# Patient Record
Sex: Male | Born: 1955 | Race: White | Hispanic: No | Marital: Single | Smoking: Never smoker
Health system: Southern US, Community
[De-identification: ages and names within clinical notes are randomized; demographics above are authoritative.]

## PROBLEM LIST (undated history)

## (undated) DIAGNOSIS — I499 Cardiac arrhythmia, unspecified: Secondary | ICD-10-CM

## (undated) DIAGNOSIS — H3322 Serous retinal detachment, left eye: Secondary | ICD-10-CM

## (undated) DIAGNOSIS — I480 Paroxysmal atrial fibrillation: Secondary | ICD-10-CM

## (undated) DIAGNOSIS — M1990 Unspecified osteoarthritis, unspecified site: Secondary | ICD-10-CM

## (undated) DIAGNOSIS — Z86718 Personal history of other venous thrombosis and embolism: Secondary | ICD-10-CM

## (undated) DIAGNOSIS — E119 Type 2 diabetes mellitus without complications: Secondary | ICD-10-CM

## (undated) DIAGNOSIS — N132 Hydronephrosis with renal and ureteral calculous obstruction: Secondary | ICD-10-CM

## (undated) DIAGNOSIS — Z87442 Personal history of urinary calculi: Secondary | ICD-10-CM

## (undated) DIAGNOSIS — N189 Chronic kidney disease, unspecified: Secondary | ICD-10-CM

## (undated) HISTORY — PX: EYE SURGERY: SHX253

## (undated) HISTORY — PX: CORONARY ANGIOPLASTY: SHX604

## (undated) HISTORY — PX: OTHER SURGICAL HISTORY: SHX169

## (undated) HISTORY — DX: Type 2 diabetes mellitus without complications: E11.9

## (undated) HISTORY — PX: REPAIR OF COMPLEX TRACTION RETINAL DETACHMENT: SHX6217

## (undated) HISTORY — PX: URETEROSCOPY WITH HOLMIUM LASER LITHOTRIPSY: SHX6645

## (undated) HISTORY — PX: FOOT SURGERY: SHX648

## (undated) HISTORY — PX: CATARACT EXTRACTION, BILATERAL: SHX1313

## (undated) HISTORY — DX: Serous retinal detachment, left eye: H33.22

## (undated) HISTORY — DX: Hydronephrosis with renal and ureteral calculous obstruction: N13.2

---

## 1995-06-15 HISTORY — PX: BACK SURGERY: SHX140

## 2015-08-29 DIAGNOSIS — R600 Localized edema: Secondary | ICD-10-CM | POA: Insufficient documentation

## 2015-08-29 DIAGNOSIS — G5702 Lesion of sciatic nerve, left lower limb: Secondary | ICD-10-CM | POA: Insufficient documentation

## 2015-08-29 DIAGNOSIS — H903 Sensorineural hearing loss, bilateral: Secondary | ICD-10-CM | POA: Insufficient documentation

## 2015-08-29 DIAGNOSIS — E6609 Other obesity due to excess calories: Secondary | ICD-10-CM | POA: Insufficient documentation

## 2015-08-29 DIAGNOSIS — H5060 Mechanical strabismus, unspecified: Secondary | ICD-10-CM | POA: Insufficient documentation

## 2015-08-29 DIAGNOSIS — E669 Obesity, unspecified: Secondary | ICD-10-CM | POA: Insufficient documentation

## 2015-08-29 DIAGNOSIS — H3322 Serous retinal detachment, left eye: Secondary | ICD-10-CM | POA: Insufficient documentation

## 2015-08-29 DIAGNOSIS — Z95828 Presence of other vascular implants and grafts: Secondary | ICD-10-CM | POA: Insufficient documentation

## 2015-09-04 DIAGNOSIS — E1140 Type 2 diabetes mellitus with diabetic neuropathy, unspecified: Secondary | ICD-10-CM | POA: Insufficient documentation

## 2015-09-04 DIAGNOSIS — I950 Idiopathic hypotension: Secondary | ICD-10-CM | POA: Insufficient documentation

## 2015-09-04 DIAGNOSIS — D3502 Benign neoplasm of left adrenal gland: Secondary | ICD-10-CM | POA: Insufficient documentation

## 2015-09-04 DIAGNOSIS — E119 Type 2 diabetes mellitus without complications: Secondary | ICD-10-CM | POA: Insufficient documentation

## 2015-09-04 DIAGNOSIS — K439 Ventral hernia without obstruction or gangrene: Secondary | ICD-10-CM | POA: Insufficient documentation

## 2015-09-04 DIAGNOSIS — K7689 Other specified diseases of liver: Secondary | ICD-10-CM | POA: Insufficient documentation

## 2015-09-28 DIAGNOSIS — R42 Dizziness and giddiness: Secondary | ICD-10-CM | POA: Insufficient documentation

## 2015-10-16 DIAGNOSIS — R222 Localized swelling, mass and lump, trunk: Secondary | ICD-10-CM | POA: Insufficient documentation

## 2015-10-17 DIAGNOSIS — R933 Abnormal findings on diagnostic imaging of other parts of digestive tract: Secondary | ICD-10-CM | POA: Insufficient documentation

## 2015-11-23 DIAGNOSIS — M7740 Metatarsalgia, unspecified foot: Secondary | ICD-10-CM | POA: Insufficient documentation

## 2015-12-31 DIAGNOSIS — N390 Urinary tract infection, site not specified: Secondary | ICD-10-CM | POA: Insufficient documentation

## 2015-12-31 DIAGNOSIS — N1330 Unspecified hydronephrosis: Secondary | ICD-10-CM | POA: Insufficient documentation

## 2016-01-14 DIAGNOSIS — N201 Calculus of ureter: Secondary | ICD-10-CM | POA: Insufficient documentation

## 2016-01-14 HISTORY — DX: Calculus of ureter: N20.1

## 2016-04-16 DIAGNOSIS — H43813 Vitreous degeneration, bilateral: Secondary | ICD-10-CM | POA: Insufficient documentation

## 2016-04-16 DIAGNOSIS — H5021 Vertical strabismus, right eye: Secondary | ICD-10-CM | POA: Insufficient documentation

## 2016-04-16 DIAGNOSIS — H53002 Unspecified amblyopia, left eye: Secondary | ICD-10-CM | POA: Insufficient documentation

## 2016-11-26 LAB — HM DIABETES EYE EXAM

## 2017-08-11 DIAGNOSIS — I480 Paroxysmal atrial fibrillation: Secondary | ICD-10-CM | POA: Diagnosis present

## 2017-09-20 DIAGNOSIS — Z87442 Personal history of urinary calculi: Secondary | ICD-10-CM | POA: Insufficient documentation

## 2020-02-05 ENCOUNTER — Other Ambulatory Visit: Payer: Self-pay

## 2020-02-05 ENCOUNTER — Encounter: Payer: Self-pay | Admitting: Podiatry

## 2020-02-05 ENCOUNTER — Ambulatory Visit (INDEPENDENT_AMBULATORY_CARE_PROVIDER_SITE_OTHER): Payer: Medicare Other | Admitting: Podiatry

## 2020-02-05 DIAGNOSIS — H43813 Vitreous degeneration, bilateral: Secondary | ICD-10-CM

## 2020-02-05 DIAGNOSIS — M79674 Pain in right toe(s): Secondary | ICD-10-CM | POA: Diagnosis not present

## 2020-02-05 DIAGNOSIS — G5702 Lesion of sciatic nerve, left lower limb: Secondary | ICD-10-CM

## 2020-02-05 DIAGNOSIS — E119 Type 2 diabetes mellitus without complications: Secondary | ICD-10-CM

## 2020-02-05 DIAGNOSIS — M79675 Pain in left toe(s): Secondary | ICD-10-CM

## 2020-02-05 DIAGNOSIS — Q828 Other specified congenital malformations of skin: Secondary | ICD-10-CM

## 2020-02-05 DIAGNOSIS — B351 Tinea unguium: Secondary | ICD-10-CM

## 2020-02-05 NOTE — Progress Notes (Signed)
This patient returns to my office for at risk foot care.  This patient requires this care by a professional since this patient will be at risk due to having diet controlled diabetes,    This patient is unable to cut nails himself since the patient cannot reach his nails.These nails are painful walking and wearing shoes. Patient has painful callus on both forefeet which are painful when he walks. This patient presents for at risk foot care today.  General Appearance  Alert, conversant and in no acute stress.  Vascular  Dorsalis pedis and posterior tibial  pulses are palpable  bilaterally.  Capillary return is within normal limits  bilaterally. Temperature is within normal limits  bilaterally.  Neurologic  Senn-Weinstein monofilament wire test within normal limits  Right.  LOPS absent left foot.. Muscle power within normal limits bilaterally.  Nails Thick disfigured discolored nails with subungual debris  from hallux to fifth toes bilaterally. No evidence of bacterial infection or drainage bilaterally.  Orthopedic  No limitations of motion  feet .  No crepitus or effusions noted.  HAV 1st MPJ  B/L.  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  Skin  normotropic skin  noted bilaterally.  No signs of infections or ulcers noted.   Porokeratosis  Forefeet  B/L.  Onychomycosis  Pain in right toes  Pain in left toes  Porokeratosis  B/L.  Consent was obtained for treatment procedures.   Mechanical debridement of nails 1-5  bilaterally performed with a nail nipper.  Filed with dremel without incident. Debridement of porokeratosis with # 15 blade.  Patient to talk with Liliane Channel about limb length discrepancy in future.   Return office visit    3 months                  Told patient to return for periodic foot care and evaluation due to potential at risk complications.  Gardiner Barefoot DPM   Gardiner Barefoot DPM

## 2020-02-05 NOTE — Patient Instructions (Signed)
po

## 2020-05-13 ENCOUNTER — Other Ambulatory Visit: Payer: Self-pay

## 2020-05-13 ENCOUNTER — Ambulatory Visit (INDEPENDENT_AMBULATORY_CARE_PROVIDER_SITE_OTHER): Payer: Medicare Other | Admitting: Podiatry

## 2020-05-13 ENCOUNTER — Encounter: Payer: Self-pay | Admitting: Podiatry

## 2020-05-13 DIAGNOSIS — Q828 Other specified congenital malformations of skin: Secondary | ICD-10-CM

## 2020-05-13 DIAGNOSIS — B351 Tinea unguium: Secondary | ICD-10-CM

## 2020-05-13 DIAGNOSIS — M79674 Pain in right toe(s): Secondary | ICD-10-CM

## 2020-05-13 DIAGNOSIS — E119 Type 2 diabetes mellitus without complications: Secondary | ICD-10-CM | POA: Diagnosis not present

## 2020-05-13 DIAGNOSIS — G5702 Lesion of sciatic nerve, left lower limb: Secondary | ICD-10-CM

## 2020-05-13 DIAGNOSIS — M79675 Pain in left toe(s): Secondary | ICD-10-CM | POA: Diagnosis not present

## 2020-05-13 NOTE — Progress Notes (Addendum)
This patient returns to my office for at risk foot care.  This patient requires this care by a professional since this patient will be at risk due to having diet controlled diabetes,    This patient is unable to cut nails himself since the patient cannot reach his nails.These nails are painful walking and wearing shoes. Patient has painful callus on both forefeet which are painful when he walks. This patient presents for at risk foot care today.  General Appearance  Alert, conversant and in no acute stress.  Vascular  Dorsalis pedis and posterior tibial  pulses are palpable  bilaterally.  Capillary return is within normal limits  bilaterally. Temperature is within normal limits  bilaterally.  Neurologic  Senn-Weinstein monofilament wire test within normal limits  Right.  LOPS absent left foot.. Muscle power within normal limits bilaterally.  Nails Thick disfigured discolored nails with subungual debris  from hallux to fifth toes bilaterally. No evidence of bacterial infection or drainage bilaterally.  Orthopedic  No limitations of motion  feet .  No crepitus or effusions noted.  HAV 1st MPJ  B/L.  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  Skin  normotropic skin  noted bilaterally.  No signs of infections or ulcers noted.   Porokeratosis  Forefeet  B/L. Callus 1,5  Callus.  Callus 1.3 right foot.  Onychomycosis  Pain in right toes  Pain in left toes  Porokeratosis  B/L.  Consent was obtained for treatment procedures.   Mechanical debridement of nails 1-5  bilaterally performed with a nail nipper.  Filed with dremel without incident. Debridement of porokeratosis with # 15 blade.     Return office visit    4 months                  Told patient to return for periodic foot care and evaluation due to potential at risk complications.     Gardiner Barefoot DPM

## 2020-09-10 ENCOUNTER — Ambulatory Visit: Payer: Medicare Other | Admitting: Podiatry

## 2021-11-18 ENCOUNTER — Emergency Department (HOSPITAL_COMMUNITY): Payer: Medicare Other

## 2021-11-18 ENCOUNTER — Ambulatory Visit: Payer: Self-pay

## 2021-11-18 ENCOUNTER — Encounter (HOSPITAL_COMMUNITY): Payer: Self-pay | Admitting: Internal Medicine

## 2021-11-18 ENCOUNTER — Other Ambulatory Visit: Payer: Self-pay

## 2021-11-18 ENCOUNTER — Inpatient Hospital Stay (HOSPITAL_COMMUNITY)
Admission: EM | Admit: 2021-11-18 | Discharge: 2021-11-26 | DRG: 271 | Disposition: A | Discharge status: Home Health | Payer: Medicare Other | Attending: Internal Medicine | Admitting: Internal Medicine

## 2021-11-18 DIAGNOSIS — Z91041 Radiographic dye allergy status: Secondary | ICD-10-CM | POA: Diagnosis not present

## 2021-11-18 DIAGNOSIS — Z86718 Personal history of other venous thrombosis and embolism: Secondary | ICD-10-CM | POA: Diagnosis not present

## 2021-11-18 DIAGNOSIS — Y92009 Unspecified place in unspecified non-institutional (private) residence as the place of occurrence of the external cause: Secondary | ICD-10-CM

## 2021-11-18 DIAGNOSIS — I82411 Acute embolism and thrombosis of right femoral vein: Secondary | ICD-10-CM | POA: Diagnosis present

## 2021-11-18 DIAGNOSIS — Z888 Allergy status to other drugs, medicaments and biological substances status: Secondary | ICD-10-CM | POA: Diagnosis not present

## 2021-11-18 DIAGNOSIS — K439 Ventral hernia without obstruction or gangrene: Secondary | ICD-10-CM | POA: Diagnosis present

## 2021-11-18 DIAGNOSIS — I82220 Acute embolism and thrombosis of inferior vena cava: Secondary | ICD-10-CM | POA: Diagnosis present

## 2021-11-18 DIAGNOSIS — M7989 Other specified soft tissue disorders: Secondary | ICD-10-CM

## 2021-11-18 DIAGNOSIS — Z95828 Presence of other vascular implants and grafts: Secondary | ICD-10-CM | POA: Diagnosis not present

## 2021-11-18 DIAGNOSIS — R2241 Localized swelling, mass and lump, right lower limb: Secondary | ICD-10-CM | POA: Diagnosis not present

## 2021-11-18 DIAGNOSIS — Z6836 Body mass index (BMI) 36.0-36.9, adult: Secondary | ICD-10-CM | POA: Diagnosis not present

## 2021-11-18 DIAGNOSIS — I824Y3 Acute embolism and thrombosis of unspecified deep veins of proximal lower extremity, bilateral: Secondary | ICD-10-CM | POA: Diagnosis present

## 2021-11-18 DIAGNOSIS — Z7401 Bed confinement status: Secondary | ICD-10-CM

## 2021-11-18 DIAGNOSIS — E1165 Type 2 diabetes mellitus with hyperglycemia: Secondary | ICD-10-CM | POA: Diagnosis present

## 2021-11-18 DIAGNOSIS — I82409 Acute embolism and thrombosis of unspecified deep veins of unspecified lower extremity: Secondary | ICD-10-CM

## 2021-11-18 DIAGNOSIS — E1169 Type 2 diabetes mellitus with other specified complication: Secondary | ICD-10-CM

## 2021-11-18 DIAGNOSIS — Z87442 Personal history of urinary calculi: Secondary | ICD-10-CM | POA: Diagnosis not present

## 2021-11-18 DIAGNOSIS — M79604 Pain in right leg: Secondary | ICD-10-CM

## 2021-11-18 DIAGNOSIS — E119 Type 2 diabetes mellitus without complications: Secondary | ICD-10-CM

## 2021-11-18 DIAGNOSIS — D3502 Benign neoplasm of left adrenal gland: Secondary | ICD-10-CM | POA: Diagnosis present

## 2021-11-18 DIAGNOSIS — E669 Obesity, unspecified: Secondary | ICD-10-CM | POA: Diagnosis present

## 2021-11-18 DIAGNOSIS — W1830XA Fall on same level, unspecified, initial encounter: Secondary | ICD-10-CM | POA: Diagnosis present

## 2021-11-18 DIAGNOSIS — I745 Embolism and thrombosis of iliac artery: Secondary | ICD-10-CM | POA: Diagnosis present

## 2021-11-18 DIAGNOSIS — R739 Hyperglycemia, unspecified: Secondary | ICD-10-CM | POA: Diagnosis present

## 2021-11-18 DIAGNOSIS — I82431 Acute embolism and thrombosis of right popliteal vein: Secondary | ICD-10-CM | POA: Diagnosis not present

## 2021-11-18 DIAGNOSIS — N179 Acute kidney failure, unspecified: Secondary | ICD-10-CM | POA: Diagnosis not present

## 2021-11-18 DIAGNOSIS — I82423 Acute embolism and thrombosis of iliac vein, bilateral: Secondary | ICD-10-CM | POA: Diagnosis present

## 2021-11-18 DIAGNOSIS — E1140 Type 2 diabetes mellitus with diabetic neuropathy, unspecified: Secondary | ICD-10-CM

## 2021-11-18 DIAGNOSIS — N132 Hydronephrosis with renal and ureteral calculous obstruction: Secondary | ICD-10-CM | POA: Diagnosis present

## 2021-11-18 DIAGNOSIS — I480 Paroxysmal atrial fibrillation: Secondary | ICD-10-CM | POA: Diagnosis present

## 2021-11-18 DIAGNOSIS — I82401 Acute embolism and thrombosis of unspecified deep veins of right lower extremity: Secondary | ICD-10-CM | POA: Diagnosis not present

## 2021-11-18 DIAGNOSIS — I82413 Acute embolism and thrombosis of femoral vein, bilateral: Secondary | ICD-10-CM | POA: Diagnosis not present

## 2021-11-18 HISTORY — DX: Paroxysmal atrial fibrillation: I48.0

## 2021-11-18 HISTORY — DX: Personal history of other venous thrombosis and embolism: Z86.718

## 2021-11-18 LAB — CBC WITH DIFFERENTIAL/PLATELET
Abs Immature Granulocytes: 0.06 10*3/uL (ref 0.00–0.07)
Basophils Absolute: 0 10*3/uL (ref 0.0–0.1)
Basophils Relative: 0 %
Eosinophils Absolute: 0.1 10*3/uL (ref 0.0–0.5)
Eosinophils Relative: 1 %
HCT: 42.7 % (ref 39.0–52.0)
Hemoglobin: 14.6 g/dL (ref 13.0–17.0)
Immature Granulocytes: 1 %
Lymphocytes Relative: 24 %
Lymphs Abs: 2 10*3/uL (ref 0.7–4.0)
MCH: 30.5 pg (ref 26.0–34.0)
MCHC: 34.2 g/dL (ref 30.0–36.0)
MCV: 89.3 fL (ref 80.0–100.0)
Monocytes Absolute: 0.5 10*3/uL (ref 0.1–1.0)
Monocytes Relative: 6 %
Neutro Abs: 5.4 10*3/uL (ref 1.7–7.7)
Neutrophils Relative %: 68 %
Platelets: 205 10*3/uL (ref 150–400)
RBC: 4.78 MIL/uL (ref 4.22–5.81)
RDW: 13.3 % (ref 11.5–15.5)
WBC: 8 10*3/uL (ref 4.0–10.5)
nRBC: 0 % (ref 0.0–0.2)

## 2021-11-18 LAB — BASIC METABOLIC PANEL
Anion gap: 7 (ref 5–15)
BUN: 19 mg/dL (ref 8–23)
CO2: 25 mmol/L (ref 22–32)
Calcium: 9.1 mg/dL (ref 8.9–10.3)
Chloride: 105 mmol/L (ref 98–111)
Creatinine, Ser: 1.16 mg/dL (ref 0.61–1.24)
GFR, Estimated: >60 mL/min (ref >60)
Glucose, Bld: 172 mg/dL — ABNORMAL HIGH (ref 70–99)
Potassium: 4.4 mmol/L (ref 3.5–5.1)
Sodium: 137 mmol/L (ref 135–145)

## 2021-11-18 MED ORDER — ONDANSETRON HCL 4 MG/2ML IJ SOLN: 4.0000 mg | Freq: Four times a day (QID) | INTRAMUSCULAR | Status: DC | PRN

## 2021-11-18 MED ORDER — ACETAMINOPHEN 325 MG PO TABS: 650.0000 mg | ORAL_TABLET | Freq: Four times a day (QID) | ORAL | Status: DC | PRN

## 2021-11-18 MED ORDER — SENNOSIDES-DOCUSATE SODIUM 8.6-50 MG PO TABS: 1.0000 | ORAL_TABLET | Freq: Every evening | ORAL | Status: DC | PRN

## 2021-11-18 MED ORDER — ACETAMINOPHEN 650 MG RE SUPP: 650.0000 mg | Freq: Four times a day (QID) | RECTAL | Status: DC | PRN

## 2021-11-18 MED ORDER — METHYLPREDNISOLONE SODIUM SUCC 40 MG IJ SOLR
40.0000 mg | Freq: Once | INTRAMUSCULAR | Status: AC
  Administered 2021-11-18: 40 mg via INTRAVENOUS
  Filled 2021-11-18: qty 1

## 2021-11-18 MED ORDER — HEPARIN (PORCINE) 25000 UT/250ML-% IV SOLN
1800.0000 [IU]/h | INTRAVENOUS | Status: AC
Start: 2021-11-18 — End: 2021-11-25
  Administered 2021-11-18: 1700 [IU]/h via INTRAVENOUS
  Administered 2021-11-19 – 2021-11-21 (×3): 1500 [IU]/h via INTRAVENOUS
  Administered 2021-11-23: 1650 [IU]/h via INTRAVENOUS
  Administered 2021-11-24 (×2): 1850 [IU]/h via INTRAVENOUS
  Administered 2021-11-25: 1800 [IU]/h via INTRAVENOUS
  Filled 2021-11-18 (×11): qty 250

## 2021-11-18 MED ORDER — DIPHENHYDRAMINE HCL 25 MG PO CAPS
50.0000 mg | ORAL_CAPSULE | Freq: Once | ORAL | Status: AC
  Administered 2021-11-18: 50 mg via ORAL
  Filled 2021-11-18: qty 2

## 2021-11-18 MED ORDER — SODIUM CHLORIDE 0.9% FLUSH
3.0000 mL | Freq: Two times a day (BID) | INTRAVENOUS | Status: DC
  Administered 2021-11-19 – 2021-11-26 (×7): 3 mL via INTRAVENOUS

## 2021-11-18 MED ORDER — ONDANSETRON HCL 4 MG PO TABS: 4.0000 mg | ORAL_TABLET | Freq: Four times a day (QID) | ORAL | Status: DC | PRN

## 2021-11-18 MED ORDER — DIPHENHYDRAMINE HCL 50 MG/ML IJ SOLN: 50.0000 mg | Freq: Once | INTRAMUSCULAR | Status: DC

## 2021-11-18 MED ORDER — HEPARIN BOLUS VIA INFUSION
6500.0000 [IU] | Freq: Once | INTRAVENOUS | Status: AC
Start: 2021-11-18 — End: 2021-11-18
  Administered 2021-11-18: 6500 [IU] via INTRAVENOUS
  Filled 2021-11-18: qty 6500

## 2021-11-18 NOTE — Consult Note (Signed)
[facility]  Patient name: Nicholas Lam MRN: [id number] DOB: 1955-09-08 Sex: male   REQUESTING PROVIDER:    ER   REASON FOR CONSULT:    DVT  HISTORY OF PRESENT ILLNESS:   Nicholas Lam is a 66 y.o. male, who presented to the emergency department yesterday with complaints of right leg pain and swelling that has been going on for a few days.  He does report a fall a week or 2 ago and since that time he has been relatively sedentary.  He does have a history of a IVC filter placed in 2009.  According to the patient, he never had a DVT, but feels like he is at high risk.  The filter was placed for preventative measures.  It was initially placed to be taken out however the patient decided that he wanted to leave it in.  The patient has a history of A-fib.  He spontaneously converted, and so his Xarelto was discontinued by cardiology.  Patient does have a history of a ventral hernia.  He is a non-smoker.  PAST MEDICAL HISTORY    Past Medical History:  Diagnosis Date   History of DVT (deep vein thrombosis)    Paroxysmal atrial fibrillation (HCC)      FAMILY HISTORY   No family history on file.  SOCIAL HISTORY:   Social History   Socioeconomic History   Marital status: Single    Spouse name: Not on file   Number of children: Not on file   Years of education: Not on file   Highest education level: Not on file  Occupational History   Not on file  Tobacco Use   Smoking status: Never   Smokeless tobacco: Never  Substance and Sexual Activity   Alcohol use: Never   Drug use: Never   Sexual activity: Not on file  Other Topics Concern   Not on file  Social History Narrative   Not on file   Social Determinants of Health   Financial Resource Strain: Not on file  Food Insecurity: Not on file  Transportation Needs: Not on file  Physical Activity: Not on file  Stress: Not on file  Social Connections: Not on  file  Intimate Partner Violence: Not on file    ALLERGIES:    Allergies  Allergen Reactions   Iodinated Contrast Media Hives, Rash and Swelling   Iodine     CURRENT MEDICATIONS:    Current Facility-Administered Medications  Medication Dose Route Frequency Provider Last Rate Last Admin   diphenhydrAMINE (BENADRYL) capsule 50 mg  50 mg Oral Once Genevive Bi F, PA-C       Or   diphenhydrAMINE (BENADRYL) injection 50 mg  50 mg Intravenous Once Genevive Bi F, PA-C       heparin ADULT infusion 100 units/mL (25000 units/238m)  1,700 Units/hr Intravenous Continuous Paytes, Austin A, RPH 17 mL/hr at 11/18/21 2021 1,700 Units/hr at 11/18/21 2021   heparin bolus via infusion 6,500 Units  6,500 Units Intravenous Once Paytes, Austin A, RPH       methylPREDNISolone sodium succinate (SOLU-MEDROL) 40 mg/mL injection 40 mg  40 mg Intravenous Once GGenevive BiF, PA-C       No current outpatient medications on file.    REVIEW OF SYSTEMS:   '[X]'$  denotes positive finding, '[ ]'$  denotes negative finding Cardiac  Comments:  Chest pain or chest pressure:    Shortness of breath upon exertion:    Short of breath when lying  flat:    Irregular heart rhythm:        Vascular    Pain in calf, thigh, or hip brought on by ambulation:    Pain in feet at night that wakes you up from your sleep:     Blood clot in your veins:    Leg swelling:  x       Pulmonary    Oxygen at home:    Productive cough:     Wheezing:         Neurologic    Sudden weakness in arms or legs:     Sudden numbness in arms or legs:     Sudden onset of difficulty speaking or slurred speech:    Temporary loss of vision in one eye:     Problems with dizziness:         Gastrointestinal    Blood in stool:      Vomited blood:         Genitourinary    Burning when urinating:     Blood in urine:        Psychiatric    Major depression:         Hematologic    Bleeding problems:    Problems with blood  clotting too easily:        Skin    Rashes or ulcers:        Constitutional    Fever or chills:     PHYSICAL EXAM:   Vitals:   11/18/21 2015 11/18/21 2030 11/18/21 2045 11/18/21 2052  BP:  (!) 145/70 (!) 116/98 (!) 116/98  Pulse: 63 77 73 72  Resp:    18  Temp:      TempSrc:      SpO2: 99% 98% 95% 99%    GENERAL: The patient is a well-nourished male, in no acute distress. The vital signs are documented above. CARDIAC: There is a regular rate and rhythm.  VASCULAR: Significant right leg edema.  No evidence of ischemia PULMONARY: Nonlabored respirations ABDOMEN: Soft and non-tender with normal pitched bowel sounds.  MUSCULOSKELETAL: There are no major deformities or cyanosis. NEUROLOGIC: No focal weakness or paresthesias are detected. SKIN: There are no ulcers or rashes noted. PSYCHIATRIC: The patient has a normal affect.  STUDIES:   I have reviewed the following Studies: IVC/Iliac: There is evidence of acute thrombus involving the right common  iliac vein. There is evidence of acute thrombus involving the left common  iliac vein. There is evidence of acute thrombus involving the right  external iliac vein. There is evidence   of nonacute thrombus involving the left external iliac vein. IVC not  visualized due to overlying abdominal hernia.   RIGHT:  - Findings consistent with acute deep vein thrombosis involving the right  common femoral vein, SF junction, right femoral vein, right proximal  profunda vein, right popliteal vein, right posterior tibial veins, right  peroneal veins, and right  gastrocnemius veins.  - Findings consistent with acute superficial vein thrombosis involving the  right small saphenous vein, and right great saphenous vein.  - No cystic structure found in the popliteal fossa.     LEFT:  - No evidence of common femoral vein obstruction.     CTVenogram: 1. Extensive deep venous thrombosis involving the IVC, common, internal, and external iliac  veins bilaterally and common femoral vein on the right. The IVC thrombus extends to the level of a IVC filter. No thrombus is seen distal to the IVC filter.  2. 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 3. Left adrenal adenoma. 4. Subcutaneous fat stranding over the lateral aspect of the right hip, possible edema, contusion, or infection. 5. Mild compression deformities at L1 and L3, indeterminate in age. 6. Aortic atherosclerosis. 7. Remaining chronic findings as described above.   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  Because of the extensive nature of his clot as well as the symptoms, I feel he would benefit from intervention.  I am trying to find a spot on the schedule this week however this may need to be done early next week.   Leia Alf, MD, FACS [facility] [phone] Pager [phone]

## 2021-11-18 NOTE — Progress Notes (Signed)
RLE venous duplex and IVC/iliac duplex has been completed.  Preliminary results given to [facility], PA-C.   Results can be found under chart review under CV PROC. 11/18/2021 4:51 PM Kiyoko Mcguirt RVT, RDMS

## 2021-11-18 NOTE — ED Provider Triage Note (Addendum)
Emergency Medicine Provider Triage Evaluation Note  Kendarious Gudino , a 66 y.o. male  was evaluated in triage.  Pt complains of right knee pain and swelling.  Started 4 days ago, progressively worsening.  History of IVC filter, no chest pain or shortness of breath, not on blood thinners.  States it feels like previous DVT,.  Review of Systems  Per HPI  Physical Exam  BP (!) 145/97   Pulse 87   Temp 98.6 F (37 C) (Oral)   Resp 16   SpO2 97%  Gen:   Awake, no distress   Resp:  Normal effort  MSK:   Moves extremities without difficulty  Other:  Right knee is warm and swollen.  Pitting edema to the right lower extremity.  Calf tenderness. Cap refill 2-3 seconds  Medical Decision Making  Medically screening exam initiated at 2:48 PM.  Appropriate orders placed.  Vanessa Alesi was informed that the remainder of the evaluation will be completed by another provider, this initial triage assessment does not replace that evaluation, and the importance of remaining in the ED until their evaluation is complete.  Per hpi   Sherrill Raring, PA-C 11/18/21 [place], Kwinton Maahs, PA-C 11/18/21 1616

## 2021-11-18 NOTE — ED Provider Notes (Signed)
[facility] DEPART[facility] Provider Note   CSN: [id number] Arrival date & time: 11/18/21  1415     History  Chief Complaint  Patient presents with   Leg Swelling    Nicholas Lam is a 66 y.o. 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  Patient presents to the ED for evaluation of right leg pain.  Patient states that 3 days ago he developed right lower extremity calf pain described as a pressure.  The patient states that since this time this pressure has progressively worsened.  Patient does not currently have a PCP.  Patient denies any numbness or tingling into the right lower extremity.  Patient denies any extreme pain in his right lower extremity.  Patient denies any recent fevers, chest pain, shortness of breath.  HPI     Home Medications Prior to Admission medications   Not on File      Allergies    Iodinated contrast media and Iodine    Review of Systems   Review of Systems  Respiratory:  Negative for shortness of breath.   Cardiovascular:  Negative for chest pain.  Musculoskeletal:  Positive for myalgias.  Neurological:  Negative for numbness.  All other systems reviewed and are negative.  Physical Exam Updated Vital Signs BP (!) 116/98 (BP Location: Right Arm)   Pulse 72   Temp 98.3 F (36.8 C) (Oral)   Resp 18   SpO2 99%  Physical Exam Vitals and nursing note reviewed.  Constitutional:      General: He is not in acute distress.    Appearance: Normal appearance. He is not ill-appearing, toxic-appearing or diaphoretic.  HENT:     Head: Normocephalic and atraumatic.     Nose: Nose normal. No congestion.     Mouth/Throat:     Mouth: Mucous membranes are moist.     Pharynx: Oropharynx is clear.  Eyes:     Extraocular Movements: Extraocular movements intact.     Conjunctiva/sclera: Conjunctivae normal.     Pupils: Pupils are equal, round, and reactive to light.  Cardiovascular:     Rate and  Rhythm: Normal rate and regular rhythm.     Pulses:          Dorsalis pedis pulses are detected w/ Doppler on the right side and detected w/ Doppler on the left side.  Pulmonary:     Effort: Pulmonary effort is normal.     Breath sounds: Normal breath sounds. No wheezing.  Abdominal:     General: Abdomen is flat. Bowel sounds are normal.     Palpations: Abdomen is soft.     Tenderness: There is no abdominal tenderness.  Musculoskeletal:        General: Normal range of motion.     Cervical back: Normal range of motion and neck supple. No tenderness.     Comments: All 4 compartments of patient right lower extremity soft compressible.  Patient denies any pain out of proportion to exam.  Skin:    General: Skin is warm and dry.     Capillary Refill: Capillary refill takes less than 2 seconds.  Neurological:     General: No focal deficit present.     Mental Status: He is alert and oriented to person, place, and time.     GCS: GCS eye subscore is 4. GCS verbal subscore is 5. GCS motor subscore is 6.     Cranial Nerves: Cranial nerves 2-12 are intact. No  cranial nerve deficit.     Sensory: Sensation is intact. No sensory deficit.     Motor: Motor function is intact. No weakness.     Comments: Patient has intact sensation and motor function to right lower extremity.    ED Results / Procedures / Treatments   Labs (all labs ordered are listed, but only abnormal results are displayed) Labs Reviewed  BASIC METABOLIC PANEL - Abnormal; Notable for the following components:      Result Value   Glucose, Bld 172 (*)    All other components within normal limits  CBC WITH DIFFERENTIAL/PLATELET  HEPARIN LEVEL (UNFRACTIONATED)  HEPARIN LEVEL (UNFRACTIONATED)    EKG None  Radiology VAS [place] IVC/ILIAC (VENOUS ONLY)  Result Date: 11/18/2021 IVC/ILIAC STUDY Patient Name:  Vaden Becherer  Date of Exam:   11/18/2021 Medical Rec #: [id number]      Accession #:    [id number] Date of Birth: Jul 24, 1955      Patient Gender: M Patient Age:   24 years Exam Location:  [facility] Procedure:      VAS [place] IVC/ILIAC (VENOUS ONLY) Referring Phys: --------------------------------------------------------------------------------  Indications: RLE DVT Limitations: Obesity and Abdominal hernia.  Comparison Study: No previous exams Performing Technologist: Jody Hill RVT, RDMS  Examination Guidelines: A complete evaluation includes B-mode imaging, spectral Doppler, color Doppler, and power Doppler as needed of all accessible portions of each vessel. Bilateral testing is considered an integral part of a complete examination. Limited examinations for reoccurring indications may be performed as noted.  IVC/Iliac Findings: +----------+------+--------+--------------+    IVC    PatentThrombus   Comments    +----------+------+--------+--------------+ IVC Prox                not visualized +----------+------+--------+--------------+ IVC Mid                 not visualized +----------+------+--------+--------------+ IVC Distal              not visualized +----------+------+--------+--------------+  +-------------------+---------+-----------+---------+-----------+--------+         CIV        RT-PatentRT-ThrombusLT-PatentLT-ThrombusComments +-------------------+---------+-----------+---------+-----------+--------+ Common Iliac Prox              acute               acute            +-------------------+---------+-----------+---------+-----------+--------+ Common Iliac Mid               acute               acute            +-------------------+---------+-----------+---------+-----------+--------+ Common Iliac Distal                                acute            +-------------------+---------+-----------+---------+-----------+--------+ Right distal CIV not visualized +-------------------------+---------+-----------+---------+-----------+--------+            EIV            RT-PatentRT-ThrombusLT-PatentLT-ThrombusComments +-------------------------+---------+-----------+---------+-----------+--------+ External Iliac Vein Prox             acute               acute            +-------------------------+---------+-----------+---------+-----------+--------+ External Iliac Vein Mid              acute  acute            +-------------------------+---------+-----------+---------+-----------+--------+ External Iliac Vein                  acute               acute            Distal                                                                    +-------------------------+---------+-----------+---------+-----------+--------+  Non occlusive thrombus seen in left EIV  Summary: IVC/Iliac: There is evidence of acute thrombus involving the right common iliac vein. There is evidence of acute thrombus involving the left common iliac vein. There is evidence of acute thrombus involving the right external iliac vein. There is evidence  of nonacute thrombus involving the left external iliac vein. IVC not visualized due to overlying abdominal hernia.  *See table(s) above for measurements and observations.   Preliminary    VAS [place] LOWER EXTREMITY VENOUS (DVT) (7a-7p)  Result Date: 11/18/2021  Lower Venous DVT Study Patient Name:  ARK AGRUSA  Date of Exam:   11/18/2021 Medical Rec #: [id number]      Accession #:    [id number] Date of Birth: [birth year]     Patient Gender: M Patient Age:   19 years Exam Location:  [facility] Procedure:      VAS [place] LOWER EXTREMITY VENOUS (DVT) Referring Phys: HALEY SAGE --------------------------------------------------------------------------------  Indications: Swelling, and Pain.  Risk Factors: Patient had IVC filter placed in 2009. States he was diagnosed to DVT without imaging study - only through elevated D-dimer. Limitations: Body habitus and poor ultrasound/tissue interface. Comparison Study: No previous exams  Performing Technologist: Jody Hill RVT, RDMS  Examination Guidelines: A complete evaluation includes B-mode imaging, spectral Doppler, color Doppler, and power Doppler as needed of all accessible portions of each vessel. Bilateral testing is considered an integral part of a complete examination. Limited examinations for reoccurring indications may be performed as noted. The reflux portion of the exam is performed with the patient in reverse Trendelenburg.  +---------+---------------+---------+-----------+----------+--------------+ RIGHT    CompressibilityPhasicitySpontaneityPropertiesThrombus Aging +---------+---------------+---------+-----------+----------+--------------+ CFV      None           No       No                   Acute          +---------+---------------+---------+-----------+----------+--------------+ SFJ      None                                         Acute          +---------+---------------+---------+-----------+----------+--------------+ FV Prox  None           No       No                   Acute          +---------+---------------+---------+-----------+----------+--------------+ FV Mid   None           No       No  Acute          +---------+---------------+---------+-----------+----------+--------------+ FV DistalNone           No       No                   Acute          +---------+---------------+---------+-----------+----------+--------------+ PFV      None           No       No                   Acute          +---------+---------------+---------+-----------+----------+--------------+ POP      None           No       No                   Acute          +---------+---------------+---------+-----------+----------+--------------+ Gastroc  None           No       No                   Acute          +---------+---------------+---------+-----------+----------+--------------+ GSV      None           No       No                    Acute          +---------+---------------+---------+-----------+----------+--------------+ SSV      None           No       No                   Acute          +---------+---------------+---------+-----------+----------+--------------+   Right Technical Findings: Only able to see proximal portion of SSV due to immobility  +----+---------------+---------+-----------+----------+--------------+ LEFTCompressibilityPhasicitySpontaneityPropertiesThrombus Aging +----+---------------+---------+-----------+----------+--------------+ CFV Full           Yes      Yes                                 +----+---------------+---------+-----------+----------+--------------+ Rouleaux flow seen in CFV    Summary: RIGHT: - Findings consistent with acute deep vein thrombosis involving the right common femoral vein, SF junction, right femoral vein, right proximal profunda vein, right popliteal vein, right posterior tibial veins, right peroneal veins, and right gastrocnemius veins. - Findings consistent with acute superficial vein thrombosis involving the right small saphenous vein, and right great saphenous vein. - No cystic structure found in the popliteal fossa.  LEFT: - No evidence of common femoral vein obstruction.  *See table(s) above for measurements and observations.    Preliminary     Procedures Procedures    Medications Ordered in ED Medications  heparin bolus via infusion 6,500 Units (has no administration in time range)  heparin ADULT infusion 100 units/mL (25000 units/220m) (1,700 Units/hr Intravenous New Bag/Given 11/18/21 2021)  methylPREDNISolone sodium succinate (SOLU-MEDROL) 40 mg/mL injection 40 mg (has no administration in time range)  diphenhydrAMINE (BENADRYL) capsule 50 mg (has no administration in time range)    Or  diphenhydrAMINE (BENADRYL) injection 50 mg (has no administration in time range)    ED Course/ Medical Decision Making/ A&P  Medical Decision Making Amount and/or Complexity of Data Reviewed Labs: ordered. Radiology: ordered.  Risk Prescription drug management. Decision regarding hospitalization.   66 year old male presents to the ED for evaluation.  Please see HPI for further details.  On examination, the patient is afebrile and nontachycardic.  The patient lung sounds are clear bilaterally and he is not hypoxic on room air.  Patient's abdomen soft and compressible all 4 quadrants.  Patient right lower extremity does not have any overlying erythema, signs of compartment syndrome.  The patient is neurovascularly intact and has intact motor function to his right lower extremity.  Patient DP pulses on right appreciated with Doppler ultrasound.  Patient worked up utilizing the following labs and imaging studies interpreted by me personally: - BMP shows elevated glucose to 172 - CBC unremarkable - Ultrasound of right lower extremity shows findings consistent with acute deep vein thrombosis involving the right common femoral vein, SF junction, right femoral vein, right proximal profunda vein, right popliteal vein, right posterior tibial veins, right peroneal veins, and right. - Ultrasound of iliac vein shows evidence of acute thrombus involving the right common iliac vein as well as thrombus involving left common iliac vein.  There is also evidence of thrombus involving the right external iliac vein.  Patient started on heparin drip performed see consult.  Dr. Trula Slade vascular surgery was consulted.  Dr. Trula Slade has agreed to consult on the patient.  CT venogram abdomen pelvis has also been ordered per Dr. Stephens Shire request. Patient has contrast dye allergy so he has been pretreated for this.  Dr. Posey Pronto of [facility] hospitalist was consulted for admission.  Dr. Posey Pronto has agreed to admit the patient for further management.  Patient is amenable to the plan and the patient is stable at time of admission.  Final Clinical  Impression(s) / ED Diagnoses Final diagnoses:  Acute deep vein thrombosis (DVT) of proximal vein of both lower extremities [facility])    Rx / DC Orders ED Discharge Orders     None         Lawana Chambers 11/18/21 2119    Teressa Lower, MD 11/18/21 [phone]

## 2021-11-18 NOTE — Assessment & Plan Note (Signed)
History of atrial fibrillation during admission December 2018 during acute influenza illness.  Unaware of any recurrence as he is not having regular medical follow-up. -Obtain EKG, follow on telemetry -On IV heparin as above

## 2021-11-18 NOTE — Telephone Encounter (Signed)
  Chief Complaint: right leg Symptoms: swelling from foot to thigh, calf pain and hip pain Frequency: 2 days Pertinent Negatives: Patient denies color is bluish I color Disposition: '[x]'$ ED /'[]'$ Urgent Care (no appt availability in office) / '[]'$ Appointment(In office/virtual)/ '[]'$  [facility]/ '[]'$ Home Care/ '[]'$ Refused Recommended Disposition /'[]'$ [facility]/ '[]'$  Follow-up with PCP Additional Notes: pt with h/o DVT's. Pt stated he had and IVC filter placed 14 years ago. Pt stated he had a fall 3 weeks ago and has been less active. Pt does not have PCP. Advised pt to go to ED. Pt stated he will call his assistant for transportation. Advised if no transportation to call 911.     Reason for Disposition  [1] Thigh, calf, or ankle swelling AND [2] bilateral AND [3] 1 side is more swollen  Answer Assessment - Initial Assessment Questions 1. ONSET: "When did the swelling start?" (e.g., minutes, hours, days)     2 days 2. LOCATION: "What part of the leg is swollen?"  "Are both legs swollen or just one leg?"     Right leg 3. SEVERITY: "How bad is the swelling?" (e.g., localized; mild, moderate, severe)  - Localized - small area of swelling localized to one leg  - MILD pedal edema - swelling limited to foot and ankle, pitting edema < 1/4 inch (6 mm) deep, rest and elevation eliminate most or all swelling  - MODERATE edema - swelling of lower leg to knee, pitting edema > 1/4 inch (6 mm) deep, rest and elevation only partially reduce swelling  - SEVERE edema - swelling extends above knee, facial or hand swelling present      Thigh to foot- severe   walking a 5/10 4. REDNESS: "Does the swelling look red or infected?"     Bluish in color 5. PAIN: "Is the swelling painful to touch?" If Yes, ask: "How painful is it?"   (Scale 1-10; mild, moderate or severe)     Calf  and hip area pain- 6. FEVER: "Do you have a fever?" If Yes, ask: "What is it, how was it measured, and when did it start?"       no 7. CAUSE: "What do you think is causing the leg swelling?"     DVT 8. MEDICAL HISTORY: "Do you have a history of heart failure, kidney disease, liver failure, or cancer?"     no 9. RECURRENT SYMPTOM: "Have you had leg swelling before?" If Yes, ask: "When was the last time?" "What happened that time?"    yes 10. OTHER SYMPTOMS: "Do you have any other symptoms?" (e.g., chest pain, difficulty breathing)       no  Protocols used: Leg Swelling and Edema-A-AH

## 2021-11-18 NOTE — H&P (Signed)
History and Physical    Nicholas Lam NTI:[id number] DOB: 10-10-1955 DOA: 11/18/2021  PCP: Patient, [name]  Patient coming from: Home  I have personally briefly reviewed patient's old medical records in [place]  Chief Complaint: Right leg swelling and pain  HPI: Nicholas Lam is a 66 y.o. 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  Patient states he fell in his home about 2-1/2 weeks ago.  He says since then he has been largely bedbound.  Over the last 2-3 days he has noted significant swelling to his right lower leg.  He was hoping this would resolve itself on its own however became clear that it was not improving therefore he came to the ED for further evaluation.  He reports a remote history of IVC filter placement in 2009 but is not clear the indication at that point.  Per care everywhere he was admitted in December 2018 for new A-fib with RVR in the setting of acute influenza illness.  He was also reportedly found to have bilateral lower extremity DVTs.  He was started on Xarelto.  Patient states he was seen by cardiology on follow-up and after 1 week heart monitor did not see any evidence of recurrent A-fib.  He states he was told he no longer required anticoagulation therefore Xarelto was discontinued.  Currently is not taking any medications.  He denies any chest pain, dyspnea, abdominal pain.  He denies any obvious bleeding.  He does not currently have a PCP.  He has a CNA/caretaker who has been with him for the last 6 months.  ED Course  Labs/Imaging on admission: I have personally reviewed following labs and imaging studies.  Initial vitals showed BP 145/97, pulse 87, RR 16, temp 98.6 F, SPO2 97% on room air.  Labs show  WBC 8.0, hemoglobin 14.6, platelets 205,000, sodium 137, potassium 4.4, bicarb 25, BUN 19, creatinine 1.16, serum glucose 172.  Bilateral lower extremity venous ultrasounds show acute DVT involving the right and left common iliac veins, right external iliac vein, and nonacute thrombus involving left external iliac vein.  IVC not visualized due to overlying abdominal hernia.  Patient was started on IV heparin.  Vascular surgery, Dr. Trula Slade, was consulted and recommended admission and obtaining CT venogram, they will see in a.m. IV Solu-Medrol and Benadryl were ordered for contrast allergy prophylaxis.  The hospitalist service was consulted to admit for further evaluation and management.  Review of Systems: All systems reviewed and are negative except as documented in history of present illness above.   Past Medical History:  Diagnosis Date   History of DVT (deep vein thrombosis)    Paroxysmal atrial fibrillation (HCC)     No past surgical history on file.  Social History:  reports that he has never smoked. He has never used smokeless tobacco. He reports that he does not drink alcohol and does not use drugs.  Allergies  Allergen Reactions   Iodinated Contrast Media Hives, Rash and Swelling   Iodine     No family history on file.   Prior to Admission medications   Not on File    Physical Exam: Vitals:   11/18/21 2045 11/18/21 2052 11/18/21 2130 11/18/21 2145  BP: (!) 116/98 (!) 116/98 (!) 140/94 133/82  Pulse: 73 72 72  66  Resp:  18    Temp:      TempSrc:      SpO2: 95% 99% 99% 98%   Constitutional: Resting in bed with head elevated.  NAD, calm, comfortable Eyes: Left eye medially deviated, lids and conjunctivae normal ENMT: Mucous membranes are moist. Posterior pharynx clear of any exudate or lesions. Neck: normal, supple, no masses. Respiratory: clear to auscultation bilaterally, no wheezing, no crackles. Normal respiratory effort. No accessory muscle use.   Cardiovascular: Regular rate and rhythm, no murmurs / rubs / gallops.  Tight pitting edema RLE. Abdomen: Ventral hernia, no tenderness.  Well-healed midline surgical scar noted.  No hepatosplenomegaly. Musculoskeletal: no clubbing / cyanosis. No joint deformity upper and lower extremities. No contractures. Normal muscle tone.  Skin: Warm, dry Neurologic: Sensation intact. Strength equal bilaterally. Psychiatric: Alert and oriented x 3.  EKG: Ordered and pending.  Assessment/Plan Principal Problem:   Acute bilateral deep vein thrombosis (DVT) of iliac veins of lower extremities (HCC) Active Problems:   Paroxysmal atrial fibrillation ([place])   Hyperglycemia   Nicholas Lam is a 66 y.o. 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  Started on IV heparin, vascular surgery to see in consultation.  Assessment and Plan: * Acute bilateral deep vein thrombosis (DVT) of iliac veins of lower extremities (HCC) Extensive acute DVTs involving right/left common iliac and right external iliac veins as well as nonacute left external iliac vein seen on venous ultrasound.  Patient reports remote history of IVC filter placed in 2009.  Also was found to have bilateral DVT in December 2018 when admitted at [facility] in [place].  Incompletely treated with Xarelto at that time per patient. -Continue IV heparin -Vascular surgery consulted -Follow CT venogram A/P, Solu-Medrol/Benadryl given for contrast allergy prophylaxis  Paroxysmal atrial fibrillation (HCC) History of atrial fibrillation during admission December 2018 during acute influenza illness.  Unaware of any recurrence as he is not having regular medical follow-up. -Obtain EKG, follow on telemetry -On IV heparin as  above  Hyperglycemia Patient reports history of diabetes, not currently on any medications.  Check A1c.  DVT prophylaxis: IV heparin Code Status: Full code, confirmed on admission Family Communication: Discussed with patient's caretaker at bedside Disposition Plan: From home, dispo pending clinical progress Consults called: Vascular surgery Severity of Illness: The appropriate patient status for this patient is INPATIENT. Inpatient status is judged to be reasonable and necessary in order to provide the required intensity of service to ensure the patient's safety. The patient's presenting symptoms, physical exam findings, and initial radiographic and laboratory data in the context of their chronic comorbidities is felt to place them at high risk for further clinical deterioration. Furthermore, it is not anticipated that the patient will be medically stable for discharge from the hospital within 2 midnights of admission.   * I certify that at the point of admission it is my clinical judgment that the patient will require inpatient hospital care spanning beyond 2 midnights from the point of admission due to high intensity of service, high risk for further deterioration and high frequency of surveillance required.Zada Finders MD [facility]  If 7PM-7AM, please contact night-coverage [email or web address]  11/18/2021, 10:31 PM

## 2021-11-18 NOTE — Progress Notes (Signed)
Pharmacy Consult for heparin gtt  Indication: DVT  Allergies  Allergen Reactions   Iodinated Contrast Media Hives, Rash and Swelling   Iodine     Patient Measurements:   Heparin Dosing Weight: 97 kg  Actual bodyweight 109 kg -patient reports weighing himself weekly   Vital Signs: Temp: 98.3 F (36.8 C) (06/07 1743) Temp Source: Oral (06/07 1743) BP: 139/78 (06/07 1919) Pulse Rate: 63 (06/07 1919)  Labs: Recent Labs    11/18/21 1458  HGB 14.6  HCT 42.7  PLT 205  CREATININE 1.16    CrCl cannot be calculated (Unknown ideal weight.).  Assessment: Nicholas Lam a 66 y.o. male presented with 3 days of R leg swelling and warmth. 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    Anticoagulation PTA: Patient not on anticoagulation PTA.  Goal of Therapy:  Heparin level 0.3-0.7 units/ml Monitor platelets by anticoagulation protocol: Yes   Plan:  Heparin bolus via infusion 6,500 Units x1  Start heparin infusion at 1,700 units/hour Check heparin level in 6 hours and daily while on heparin, daily CBC  Monitor for signs and symptoms of bleeding   Adria Dill, PharmD PGY-1 Acute Care Resident  11/18/2021 7:53 PM

## 2021-11-18 NOTE — Assessment & Plan Note (Signed)
Patient reports history of diabetes, not currently on any medications.  Check A1c.

## 2021-11-18 NOTE — ED Notes (Signed)
RN attempted to stick x 3 and all blew.

## 2021-11-18 NOTE — ED Triage Notes (Signed)
Pt with hx of DVT and IVC filter here with three days of R leg swelling and warmth.

## 2021-11-18 NOTE — Hospital Course (Signed)
Nicholas Lam is a 66 y.o. 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  Started on IV heparin, vascular surgery to see in consultation.

## 2021-11-18 NOTE — Assessment & Plan Note (Signed)
Extensive acute DVTs involving right/left common iliac and right external iliac veins as well as nonacute left external iliac vein seen on venous ultrasound.  Patient reports remote history of IVC filter placed in 2009.  Also was found to have bilateral DVT in December 2018 when admitted at [facility] in [place].  Incompletely treated with Xarelto at that time per patient. -Continue IV heparin -Vascular surgery consulted -Follow CT venogram A/P, Solu-Medrol/Benadryl given for contrast allergy prophylaxis

## 2021-11-19 ENCOUNTER — Inpatient Hospital Stay (HOSPITAL_COMMUNITY): Payer: Medicare Other

## 2021-11-19 ENCOUNTER — Encounter (HOSPITAL_COMMUNITY): Payer: Self-pay | Admitting: Internal Medicine

## 2021-11-19 DIAGNOSIS — I82423 Acute embolism and thrombosis of iliac vein, bilateral: Secondary | ICD-10-CM | POA: Diagnosis not present

## 2021-11-19 DIAGNOSIS — I480 Paroxysmal atrial fibrillation: Secondary | ICD-10-CM

## 2021-11-19 DIAGNOSIS — E1169 Type 2 diabetes mellitus with other specified complication: Secondary | ICD-10-CM

## 2021-11-19 LAB — BASIC METABOLIC PANEL
Anion gap: 8 (ref 5–15)
BUN: 17 mg/dL (ref 8–23)
CO2: 21 mmol/L — ABNORMAL LOW (ref 22–32)
Calcium: 8.4 mg/dL — ABNORMAL LOW (ref 8.9–10.3)
Chloride: 101 mmol/L (ref 98–111)
Creatinine, Ser: 1 mg/dL (ref 0.61–1.24)
GFR, Estimated: >60 mL/min (ref >60)
Glucose, Bld: 256 mg/dL — ABNORMAL HIGH (ref 70–99)
Potassium: 4.2 mmol/L (ref 3.5–5.1)
Sodium: 130 mmol/L — ABNORMAL LOW (ref 135–145)

## 2021-11-19 LAB — HEMOGLOBIN A1C
Hgb A1c MFr Bld: 8.2 % — ABNORMAL HIGH (ref 4.8–5.6)
Mean Plasma Glucose: 188.64 mg/dL

## 2021-11-19 LAB — HEPARIN LEVEL (UNFRACTIONATED)
Heparin Unfractionated: 0.57 IU/mL (ref 0.30–0.70)
Heparin Unfractionated: 0.89 IU/mL — ABNORMAL HIGH (ref 0.30–0.70)
Heparin Unfractionated: 0.47 IU/mL (ref 0.30–0.70)

## 2021-11-19 LAB — GLUCOSE, CAPILLARY
Glucose-Capillary: 208 mg/dL — ABNORMAL HIGH (ref 70–99)
Glucose-Capillary: 291 mg/dL — ABNORMAL HIGH (ref 70–99)

## 2021-11-19 LAB — HIV ANTIBODY (ROUTINE TESTING W REFLEX): HIV Screen 4th Generation wRfx: NONREACTIVE

## 2021-11-19 MED ORDER — INSULIN ASPART 100 UNIT/ML IJ SOLN
0.0000 [IU] | Freq: Three times a day (TID) | INTRAMUSCULAR | Status: DC
  Administered 2021-11-19: 3 [IU] via SUBCUTANEOUS
  Administered 2021-11-20 (×2): 2 [IU] via SUBCUTANEOUS
  Administered 2021-11-21: 3 [IU] via SUBCUTANEOUS
  Administered 2021-11-21: 2 [IU] via SUBCUTANEOUS
  Administered 2021-11-21: 3 [IU] via SUBCUTANEOUS
  Administered 2021-11-22: 1 [IU] via SUBCUTANEOUS
  Administered 2021-11-22 – 2021-11-23 (×4): 2 [IU] via SUBCUTANEOUS
  Administered 2021-11-23: 5 [IU] via SUBCUTANEOUS
  Administered 2021-11-24: 2 [IU] via SUBCUTANEOUS
  Administered 2021-11-24: 1 [IU] via SUBCUTANEOUS
  Administered 2021-11-24 – 2021-11-25 (×4): 3 [IU] via SUBCUTANEOUS
  Administered 2021-11-26: 1 [IU] via SUBCUTANEOUS

## 2021-11-19 MED ORDER — DIPHENHYDRAMINE HCL 25 MG PO CAPS
50.0000 mg | ORAL_CAPSULE | Freq: Once | ORAL | Status: AC
  Administered 2021-11-19: 50 mg via ORAL
  Filled 2021-11-19: qty 2

## 2021-11-19 MED ORDER — IOHEXOL 350 MG/ML SOLN
125.0000 mL | Freq: Once | INTRAVENOUS | Status: AC | PRN
  Administered 2021-11-19: 125 mL via INTRAVENOUS

## 2021-11-19 NOTE — ED Notes (Signed)
Patient is resting comfortably. 

## 2021-11-19 NOTE — ED Notes (Signed)
Pt to scanner 

## 2021-11-19 NOTE — Progress Notes (Signed)
Pharmacy Consult for heparin gtt  Indication: DVT  Allergies  Allergen Reactions   Iodinated Contrast Media Hives, Rash and Swelling   Iodine     Patient Measurements:   Heparin Dosing Weight: 97 kg  Actual bodyweight 109 kg -patient reports weighing himself weekly   Vital Signs: BP: 131/64 (06/08 0515) Pulse Rate: 66 (06/08 0515)  Labs: Recent Labs    11/18/21 1458 11/19/21 0407 11/19/21 0549  HGB 14.6  --   --   HCT 42.7  --   --   PLT 205  --   --   HEPARINUNFRC  --  0.57 0.89*  CREATININE 1.16 1.00  --      CrCl cannot be calculated (Unknown ideal weight.).  Assessment: Nicholas Lam a 66 y.o. male presented with 3 days of R leg swelling and warmth. 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    Anticoagulation PTA: Patient not on anticoagulation PTA.  Repeat heparin level above goal 0.89; no infusion issues or overt bleeding reported  Goal of Therapy:  Heparin level 0.3-0.7 units/ml Monitor platelets by anticoagulation protocol: Yes   Plan:  Reduce heparin infusion to 1,500 units/hour Check heparin level in 8 hours and daily while on heparin, daily CBC  Monitor for signs and symptoms of bleeding   Georga Bora, PharmD Clinical Pharmacist 11/19/2021 6:51 AM Please check AMION for all [place] numbers

## 2021-11-19 NOTE — Progress Notes (Signed)
PROGRESS NOTE        PATIENT DETAILS Name: Nicholas Lam Age: 66 y.o. Sex: male Date of Birth: 30-Oct-1955 Admit Date: 11/18/2021 Admitting Physician Lenore Cordia, MD WUJ:[id number], [name]  Brief Summary: Nicholas Lam is a 66 y.o. 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  Started on IV heparin, vascular surgery consulted.  Significant Event: 06/07 >>> Pt admitted with extensive BLE DVT.  Significant Studies: 06/08: >> CT Venogram Abd / Pelvis:  1. 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 The IVC thrombus extends to the level of a IVC filter. No thrombus is seen distal to the IVC filter. 2. 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 3. Left adrenal adenoma. 4. Subcutaneous fat stranding over the lateral aspect of the right hip, possible edema, contusion, or infection.  06/07: >> Vas [place] IVC / ILIAC:  IVC/Iliac: There is evidence of acute thrombus involving the right common iliac vein. There is evidence of acute thrombus involving the left common iliac vein. There is evidence of acute thrombus involving the right external iliac vein. There is evidence of nonacute thrombus involving the left external iliac vein. IVC not visualized due to overlying abdominal hernia.  Lab Data:    Latest Ref Rng & Units 11/19/2021    4:07 AM 11/18/2021    2:58 PM  BMP  Glucose 70 - 99 mg/dL 256  172   BUN 8 - 23 mg/dL 17  19   Creatinine 0.61 - 1.24 mg/dL 1.00  1.16   Sodium 135 - 145 mmol/L 130  137   Potassium 3.5 - 5.1 mmol/L 4.2  4.4   Chloride 98 - 111 mmol/L 101  105    CO2 22 - 32 mmol/L 21  25   Calcium 8.9 - 10.3 mg/dL 8.4  9.1      Subjective: Pt lying comfortably in bed-denies any chest pain or shortness of breath.  Objective: Vitals: Blood pressure (!) 156/96, pulse (!) 102, temperature 98.3 F (36.8 C), temperature source Oral, resp. rate 20, SpO2 97 %.   Exam: Gen Exam: Alert, awake and answer appropriately, not in any distress HEENT: Neck supple, normocephalic Chest: non-labored breathing, B/L clear with diminished bases   CVS: S1S2 regular Abdomen: protuberant with +BS; soft non tender, non distended Extremities:RLE shiny, warm to touch and + 2 edema Neurology: A&O x4, non focal Skin: ecchymosis to BUE and RLE; no rash  Pertinent Labs/Radiology:    Latest Ref Rng & Units 11/18/2021    2:58 PM  CBC  WBC 4.0 - 10.5 K/uL 8.0   Hemoglobin 13.0 - 17.0 g/dL 14.6   Hematocrit 39.0 - 52.0 % 42.7   Platelets 150 - 400 K/uL 205     Lab Results  Component Value Date   NA 130 (L) 11/19/2021   K 4.2 11/19/2021   CL 101 11/19/2021   CO2 21 (L) 11/19/2021    Assessment/Plan: Assessment and Plan: Acute bilateral deep vein thrombosis (DVT) of iliac veins of lower extremities (HCC) Extensive acute DVTs involving right/left common  iliac and right external iliac veins as well as nonacute left external iliac vein seen on venous ultrasound.  Patient reports remote history of IVC filter placed in 2009.  Also was found to have bilateral DVT in December 2018 when admitted at [facility] in [place].  Incompletely treated with Xarelto at that time per patient. -Continue IV heparin -Vascular surgery following -Urology consulted secondary to venogram findings.  Hyperglycemia Patient reports history of diabetes, not currently on any medications.   -  A1c  8.2 - Initiate sliding scale insulin  CMP     Component Value Date/Time   NA 130 (L) 11/19/2021 0407   K 4.2 11/19/2021 0407   CL 101 11/19/2021 0407   CO2 21 (L) 11/19/2021 0407   GLUCOSE  256 (H) 11/19/2021 0407   BUN 17 11/19/2021 0407   CREATININE 1.00 11/19/2021 0407   CALCIUM 8.4 (L) 11/19/2021 0407   GFRNONAA >60 11/19/2021 0407    Paroxysmal atrial fibrillation (HCC) History of atrial fibrillation during admission December 2018 during acute influenza illness.   -EKG obtained and NSR; on telemetry - Continue IV heparin as above   BMI/Obesity/Underweight: There is no height or weight on file to calculate BMI.   Code status:   Code Status: Full Code   DVT Prophylaxis:Heparin drip   Family Communication: None at bedside  Disposition Plan: Status is: Inpatient Remains inpatient appropriate because: Not medically stable   Planned Discharge Destination:TBD  Diet: Diet Order             Diet regular Room service appropriate? Yes; Fluid consistency: Thin  Diet effective now                   Antimicrobial agents: Anti-infectives (From admission, onward)    None       MEDICATIONS: Scheduled Meds:  sodium chloride flush  3 mL Intravenous Q12H   Continuous Infusions:  heparin 1,500 Units/hr (11/19/21 0731)   PRN Meds:.acetaminophen **OR** acetaminophen, ondansetron **OR** ondansetron (ZOFRAN) IV, senna-docusate   I have personally reviewed following labs and imaging studies  LABORATORY DATA: CBC: Recent Labs  Lab 11/18/21 1458  WBC 8.0  NEUTROABS 5.4  HGB 14.6  HCT 42.7  MCV 89.3  PLT 678    Basic Metabolic Panel: Recent Labs  Lab 11/18/21 1458 11/19/21 0407  NA 137 130*  K 4.4 4.2  CL 105 101  CO2 25 21*  GLUCOSE 172* 256*  BUN 19 17  CREATININE 1.16 1.00  CALCIUM 9.1 8.4*    GFR: CrCl cannot be calculated (Unknown ideal weight.).  Liver Function Tests: No results for input(s): "AST", "ALT", "ALKPHOS", "BILITOT", "PROT", "ALBUMIN" in the last 168 hours. No results for input(s): "LIPASE", "AMYLASE" in the last 168 hours. No results for input(s): "AMMONIA" in the last 168 hours.  Coagulation Profile: No results  for input(s): "INR", "PROTIME" in the last 168 hours.  Cardiac Enzymes: No results for input(s): "CKTOTAL", "CKMB", "CKMBINDEX", "TROPONINI" in the last 168 hours.  BNP (last 3 results) No results for input(s): "PROBNP" in the last 8760 hours.  Lipid Profile: No results for input(s): "CHOL", "HDL", "LDLCALC", "TRIG", "CHOLHDL", "LDLDIRECT" in the last 72 hours.  Thyroid Function Tests: No results for input(s): "TSH", "T4TOTAL", "FREET4", "T3FREE", "THYROIDAB" in the last 72 hours.  Anemia Panel: No results for input(s): "VITAMINB12", "FOLATE", "FERRITIN", "TIBC", "IRON", "RETICCTPCT" in the last 72 hours.  Urine analysis: No results found for: "COLORURINE", "APPEARANCEUR", "LABSPEC", "PHURINE", "GLUCOSEU", "HGBUR", "BILIRUBINUR", "KETONESUR", "PROTEINUR", "UROBILINOGEN", "NITRITE", "LEUKOCYTESUR"  Sepsis Labs: Lactic Acid, Venous No results found for: "LATICACIDVEN"  MICROBIOLOGY: No results found for this or any previous visit (from the past 240 hour(s)).  RADIOLOGY STUDIES/RESULTS: CT VENOGRAM ABD/PEL  Addendum Date: 11/19/2021   ADDENDUM REPORT: 11/19/2021 03:28 ADDENDUM: Critical findings were reported to Dr. Alcario Drought at 3:27 a.m. Electronically Signed   By: Brett Fairy M.D.   On: 11/19/2021 03:28   Result Date: 11/19/2021 CLINICAL DATA:  Deep venous thrombosis. EXAM: CT VENOGRAM ABDOMEN AND PELVIS TECHNIQUE: Multidetector CT imaging of the abdomen and pelvis was performed using standard protocol during bolus administration of intravenous contrast. Multiplanar reconstructed images and MIPS were obtained and reviewed to evaluate the vascular anatomy. RADIATION DOSE REDUCTION: This exam was performed according to the departmental dose-optimization program which includes automated exposure control, adjustment of the mA and/or kV according to patient size and/or use of iterative reconstruction technique. CONTRAST:  137m OMNIPAQUE IOHEXOL 350 MG/ML SOLN COMPARISON:  None Available.  FINDINGS: There is occlusion of the common femoral, external iliac, and common iliac veins on the right. Thrombus is present in the proximal external iliac and common iliac veins on the left. There is likely occlusive thrombus involving the internal iliac vein on the right and partial occlusion of the internal iliac vein on the left. There is thrombus in the IVC to the level of a IVC filter in the infrarenal region. Distal to the IVC filter. No thrombus is identified. The portal vein, superior mesenteric vein, and splenic vein are patent. Heart is enlarged and there is no pericardial effusion. Atelectasis and mild fibrotic changes are noted at the lung bases. Subcentimeter hypodensities are present in the liver which are too small to further characterize. Hepatic steatosis is noted. The gallbladder is without stones. No biliary ductal dilatation. Pancreas is unremarkable. No pancreatic ductal dilatation or surrounding fat stranding. The spleen is mildly enlarged at 14 cm in length. No focal abnormality is seen. The right adrenal gland is within normal limits. There is a 2.2 cm nodule in the left adrenal gland with attenuation of -5 Hounsfield units, compatible with adenoma. The kidneys enhance symmetrically. Evaluation for renal calculi is limited due to the presence of contrast at the renal pyramids bilaterally. There is moderate to severe hydronephrosis on the left with multiple calcifications in the left renal pelvis, the largest at the ureteropelvic junction measuring 1.5 cm. No obstructive uropathy on the right. Bladder is within normal limits. The stomach is within normal limits. No bowel obstruction, free air, or pneumatosis. Scattered diverticula are present along the colon without evidence of diverticulitis. The appendix is not visualized on exam. No focal bowel wall thickening. There is atherosclerotic calcification of the aorta without evidence of aneurysm. No abdominal or pelvic lymphadenopathy. Prostate  gland is unremarkable. There small fat containing inguinal hernias bilaterally. Evidence of prior abdominal wall hernia repairs is noted. There is a fat containing anterior abdominal wall hernia in the midline adjacent to prior hernia repair. No abdominopelvic ascites. A trace amount of edema is noted in the presacral space. Subcutaneous fat stranding is noted at the right hip laterally. Degenerative changes are present in the thoracolumbar spine. There are mild compression deformities in the superior endplate at L1 and superior endplate at L3, indeterminate in age. There are severe degenerative changes at the right hip and mild degenerative changes at the left hip. Sclerotic regions are present in the proximal right femur, likely bone infarct or enchondroma. IMPRESSION: 1. Extensive deep venous thrombosis involving the IVC, common, internal,  and external iliac veins bilaterally and common femoral vein on the right. The IVC thrombus extends to the level of a IVC filter. No thrombus is seen distal to the IVC filter. 2. 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 3. Left adrenal adenoma. 4. Subcutaneous fat stranding over the lateral aspect of the right hip, possible edema, contusion, or infection. 5. Mild compression deformities at L1 and L3, indeterminate in age. 6. Aortic atherosclerosis. 7. Remaining chronic findings as described above. Electronically Signed: By: Brett Fairy M.D. On: 11/19/2021 03:13   VAS [place] LOWER EXTREMITY VENOUS (DVT) (7a-7p)  Result Date: 11/18/2021  Lower Venous DVT Study Patient Name:  TREVYN LUMPKIN  Date of Exam:   11/18/2021 Medical Rec #: [id number]      Accession #:    [id number] Date of Birth: [birth year]     Patient Gender: M Patient Age:   30 years Exam Location:  [facility] Procedure:      VAS [place] LOWER EXTREMITY VENOUS (DVT) Referring Phys: HALEY SAGE  --------------------------------------------------------------------------------  Indications: Swelling, and Pain.  Risk Factors: Patient had IVC filter placed in 2009. States he was diagnosed to DVT without imaging study - only through elevated D-dimer. Limitations: Body habitus and poor ultrasound/tissue interface. Comparison Study: No previous exams Performing Technologist: Jody Hill RVT, RDMS  Examination Guidelines: A complete evaluation includes B-mode imaging, spectral Doppler, color Doppler, and power Doppler as needed of all accessible portions of each vessel. Bilateral testing is considered an integral part of a complete examination. Limited examinations for reoccurring indications may be performed as noted. The reflux portion of the exam is performed with the patient in reverse Trendelenburg.  +---------+---------------+---------+-----------+----------+--------------+ RIGHT    CompressibilityPhasicitySpontaneityPropertiesThrombus Aging +---------+---------------+---------+-----------+----------+--------------+ CFV      None           No       No                   Acute          +---------+---------------+---------+-----------+----------+--------------+ SFJ      None                                         Acute          +---------+---------------+---------+-----------+----------+--------------+ FV Prox  None           No       No                   Acute          +---------+---------------+---------+-----------+----------+--------------+ FV Mid   None           No       No                   Acute          +---------+---------------+---------+-----------+----------+--------------+ FV DistalNone           No       No                   Acute          +---------+---------------+---------+-----------+----------+--------------+ PFV      None           No       No  Acute           +---------+---------------+---------+-----------+----------+--------------+ POP      None           No       No                   Acute          +---------+---------------+---------+-----------+----------+--------------+ Gastroc  None           No       No                   Acute          +---------+---------------+---------+-----------+----------+--------------+ GSV      None           No       No                   Acute          +---------+---------------+---------+-----------+----------+--------------+ SSV      None           No       No                   Acute          +---------+---------------+---------+-----------+----------+--------------+   Right Technical Findings: Only able to see proximal portion of SSV due to immobility  +----+---------------+---------+-----------+----------+--------------+ LEFTCompressibilityPhasicitySpontaneityPropertiesThrombus Aging +----+---------------+---------+-----------+----------+--------------+ CFV Full           Yes      Yes                                 +----+---------------+---------+-----------+----------+--------------+ Rouleaux flow seen in CFV    Summary: RIGHT: - Findings consistent with acute deep vein thrombosis involving the right common femoral vein, SF junction, right femoral vein, right proximal profunda vein, right popliteal vein, right posterior tibial veins, right peroneal veins, and right gastrocnemius veins. - Findings consistent with acute superficial vein thrombosis involving the right small saphenous vein, and right great saphenous vein. - No cystic structure found in the popliteal fossa.  LEFT: - No evidence of common femoral vein obstruction.  *See table(s) above for measurements and observations. Electronically signed by Harold Barban MD on 11/18/2021 at 10:56:30 PM.    Final    VAS [place] IVC/ILIAC (VENOUS ONLY)  Result Date: 11/18/2021 IVC/ILIAC STUDY Patient Name:  Majd Tissue  Date of Exam:   11/18/2021  Medical Rec #: [id number]      Accession #:    [id number] Date of Birth: 07-Jan-1956     Patient Gender: M Patient Age:   42 years Exam Location:  [facility] Procedure:      VAS [place] IVC/ILIAC (VENOUS ONLY) Referring Phys: --------------------------------------------------------------------------------  Indications: RLE DVT Limitations: Obesity and Abdominal hernia.  Comparison Study: No previous exams Performing Technologist: Jody Hill RVT, RDMS  Examination Guidelines: A complete evaluation includes B-mode imaging, spectral Doppler, color Doppler, and power Doppler as needed of all accessible portions of each vessel. Bilateral testing is considered an integral part of a complete examination. Limited examinations for reoccurring indications may be performed as noted.  IVC/Iliac Findings: +----------+------+--------+--------------+    IVC    PatentThrombus   Comments    +----------+------+--------+--------------+ IVC Prox                not visualized +----------+------+--------+--------------+ IVC Mid  not visualized +----------+------+--------+--------------+ IVC Distal              not visualized +----------+------+--------+--------------+  +-------------------+---------+-----------+---------+-----------+--------+         CIV        RT-PatentRT-ThrombusLT-PatentLT-ThrombusComments +-------------------+---------+-----------+---------+-----------+--------+ Common Iliac Prox              acute               acute            +-------------------+---------+-----------+---------+-----------+--------+ Common Iliac Mid               acute               acute            +-------------------+---------+-----------+---------+-----------+--------+ Common Iliac Distal                                acute            +-------------------+---------+-----------+---------+-----------+--------+ Right distal CIV not visualized  +-------------------------+---------+-----------+---------+-----------+--------+            EIV           RT-PatentRT-ThrombusLT-PatentLT-ThrombusComments +-------------------------+---------+-----------+---------+-----------+--------+ External Iliac Vein Prox             acute               acute            +-------------------------+---------+-----------+---------+-----------+--------+ External Iliac Vein Mid              acute               acute            +-------------------------+---------+-----------+---------+-----------+--------+ External Iliac Vein                  acute               acute            Distal                                                                    +-------------------------+---------+-----------+---------+-----------+--------+  Non occlusive thrombus seen in left EIV  Summary: IVC/Iliac: There is evidence of acute thrombus involving the right common iliac vein. There is evidence of acute thrombus involving the left common iliac vein. There is evidence of acute thrombus involving the right external iliac vein. There is evidence  of nonacute thrombus involving the left external iliac vein. IVC not visualized due to overlying abdominal hernia.  *See table(s) above for measurements and observations.  Electronically signed by Harold Barban MD on 11/18/2021 at 10:54:34 PM.    Final      LOS: 1 day   Tanda Rockers, AG-ACNP Student   [facility]   11/19/2021, 11:27 AM

## 2021-11-19 NOTE — Progress Notes (Signed)
Pharmacy Consult for heparin gtt  Indication: DVT  Allergies  Allergen Reactions   Iodinated Contrast Media Hives, Rash and Swelling   Iodine     Patient Measurements:   Heparin Dosing Weight: 97 kg  Actual bodyweight 109 kg -patient reports weighing himself weekly   Vital Signs: Temp: 98.3 F (36.8 C) (06/07 1743) Temp Source: Oral (06/07 1743) BP: 117/99 (06/08 0430) Pulse Rate: 85 (06/08 0430)  Labs: Recent Labs    11/18/21 1458 11/19/21 0407  HGB 14.6  --   HCT 42.7  --   PLT 205  --   HEPARINUNFRC  --  0.57  CREATININE 1.16 1.00     CrCl cannot be calculated (Unknown ideal weight.).  Assessment: Nicholas Lam a 66 y.o. male presented with 3 days of R leg swelling and warmth. 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    Anticoagulation PTA: Patient not on anticoagulation PTA.  Heparin level 0.57 therapeutic; no infusion issues or overt bleeding reported  Goal of Therapy:  Heparin level 0.3-0.7 units/ml Monitor platelets by anticoagulation protocol: Yes   Plan:  Continue heparin infusion at 1,700 units/hour Check heparin level in 8 hours and daily while on heparin, daily CBC  Monitor for signs and symptoms of bleeding   Georga Bora, PharmD Clinical Pharmacist 11/19/2021 4:59 AM Please check AMION for all [place] numbers

## 2021-11-19 NOTE — ED Notes (Signed)
MD at bedside. 

## 2021-11-19 NOTE — ED Notes (Signed)
Per [facility] ED Pharmacist, keep heparin dosage the same at this time based on heparin level.

## 2021-11-19 NOTE — Progress Notes (Signed)
Inpatient Diabetes Program Recommendations  AACE/ADA: New Consensus Statement on Inpatient Glycemic Control (2015)  Target Ranges:  Prepandial:   less than 140 mg/dL      Peak postprandial:   less than 180 mg/dL (1-2 hours)      Critically ill patients:  140 - 180 mg/dL   Lab Results  Component Value Date   HGBA1C 8.2 (H) 11/19/2021    Review of Glycemic Control  Diabetes history: type 2 Outpatient Diabetes medications: none Current orders for Inpatient glycemic control: none  Inpatient Diabetes Program Recommendations:   Spoke to patient at the bedside. Patient was diagnosed with diabetes about 6 years ago. States that he was on Metformin for a short time. Was being followed by a diabetes specialist in [place] when he was living there. States that he needs a PCP here in [place].   Reviewed the A1C of 8.2%. States that his blood sugars that are checked once a week usually run around 140-170 mg/dl. He has a CNA who comes to help him once a week. Patient does not want to take insulin. Suggested that he may need to start on an oral agent again, but needs to be followed by a PCP.  Will continue to monitor blood sugars while in the hospital.  Harvel Ricks RN BSN CDE Diabetes Coordinator Pager: [phone]  8am-5pm

## 2021-11-19 NOTE — ED Notes (Signed)
Pt returned from CT at this time.  

## 2021-11-19 NOTE — Plan of Care (Signed)
  Problem: Education: Goal: Ability to describe self-care measures that may prevent or decrease complications (Diabetes Survival Skills Education) will improve Outcome: Progressing   Problem: Health Behavior/Discharge Planning: Goal: Ability to identify and utilize available resources and services will improve Outcome: Progressing Goal: Ability to manage health-related needs will improve Outcome: Progressing   

## 2021-11-19 NOTE — Progress Notes (Signed)
Pharmacy Consult for heparin gtt  Indication: DVT  Allergies  Allergen Reactions   Iodinated Contrast Media Hives, Rash and Swelling   Iodine     Patient Measurements:   Heparin Dosing Weight: 97 kg  Actual bodyweight 109 kg -patient reports weighing himself weekly   Vital Signs: Temp: 98.4 F (36.9 C) (06/08 1519) Temp Source: Oral (06/08 1519) BP: 154/82 (06/08 1500) Pulse Rate: 62 (06/08 1500)  Labs: Recent Labs    11/18/21 1458 11/19/21 0407 11/19/21 0549 11/19/21 1504  HGB 14.6  --   --   --   HCT 42.7  --   --   --   PLT 205  --   --   --   HEPARINUNFRC  --  0.57 0.89* 0.47  CREATININE 1.16 1.00  --   --      CrCl cannot be calculated (Unknown ideal weight.).  Assessment: Nicholas Lam a 66 y.o. male presented with 3 days of R leg swelling and warmth. 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    Anticoagulation PTA: Patient not on anticoagulation PTA.  Heparin level therapeutic s/p rate decrease to 1500 units/hr  Goal of Therapy:  Heparin level 0.3-0.7 units/ml Monitor platelets by anticoagulation protocol: Yes   Plan:  Continue heparin gtt at 1500 units/hr Daily heparin level, CBC, s/s bleeding  Bertis Ruddy, PharmD Clinical Pharmacist ED Pharmacist Phone # [phone] 11/19/2021 4:00 PM

## 2021-11-19 NOTE — Consult Note (Signed)
Urology Consult  Referring physician: Sherrye Payor Reason for referral: ureter stone  Chief Complaint: ureter stone  History of Present Illness: Patient is being admitted with a deep vein thrombosis.  He has atrial fibrillation and used to be on blood thinners and has a filter.  He came in with significant swelling of the right lower leg.  He may be having a thrombectomy during this admission.  He has a clot in the right and left common iliac veins- extensive as noted.  He had a CT venogram which showed a 1.5 cm stone in the left ureteropelvic junction with moderate hydronephrosis. On heparin  the CT scan demonstrated moderate hydronephrosis on the left with multiple stones in the left renal pelvis the largest being 1.5 cm.  Right kidney normal. Others stones minimal stone burden  Serum creatinine is 1.0 and GFR is greater than 60.  More than 10 years ago describes left stone and failed u-scope and thin ureter and possible ureter perforation and perc tube; he says stone was never retrieved even with second scope  [place] three weeks ago and had back pain; no pain now and no colic symptoms  Normally voids every 3-4 hours, Nx 1  Flow bit slower and describes stricture tx years ago and possible dilated during uscope     Past Medical History:  Diagnosis Date   History of DVT (deep vein thrombosis)    Paroxysmal atrial fibrillation ([place])    History reviewed. No pertinent surgical history.  Medications: I have reviewed the patient's current medications. Allergies:  Allergies  Allergen Reactions   Iodinated Contrast Media Hives, Rash and Swelling   Iodine     History reviewed. No pertinent family history. Social History:  reports that he has never smoked. He has never used smokeless tobacco. He reports that he does not drink alcohol and does not use drugs.  ROS: All systems are reviewed and negative except as noted. Rest negative   Physical Exam:  Vital signs in last 24 hours: Temp:   [98.3 F (36.8 C)-98.6 F (37 C)] 98.3 F (36.8 C) (06/07 1743) Pulse Rate:  [61-87] 64 (06/08 0800) Resp:  [11-26] 20 (06/08 0800) BP: (105-157)/(51-99) 152/51 (06/08 0800) SpO2:  [92 %-100 %] 97 % (06/08 0800)  Cardiovascular: Skin warm; not flushed Respiratory: Breaths quiet; no shortness of breath Abdomen: No masses Neurological: Normal sensation to touch Musculoskeletal: Normal motor function arms and legs Lymphatics: No inguinal adenopathy Skin: No rashes Genitourinary:nontoxic; no cva tenderness; genitalia normal with concealed penis  Laboratory Data:  Results for orders placed or performed during the hospital encounter of 11/18/21 (from the past 72 hour(s))  Basic metabolic panel     Status: Abnormal   Collection Time: 11/18/21  2:58 PM  Result Value Ref Range   Sodium 137 135 - 145 mmol/L   Potassium 4.4 3.5 - 5.1 mmol/L   Chloride 105 98 - 111 mmol/L   CO2 25 22 - 32 mmol/L   Glucose, Bld 172 (H) 70 - 99 mg/dL    Comment: Glucose reference range applies only to samples taken after fasting for at least 8 hours.   BUN 19 8 - 23 mg/dL   Creatinine, Ser 1.16 0.61 - 1.24 mg/dL   Calcium 9.1 8.9 - 10.3 mg/dL   GFR, Estimated >60 >60 mL/min    Comment: (NOTE) Calculated using the CKD-EPI Creatinine Equation (2021)    Anion gap 7 5 - 15    Comment: Performed at [facility], [address] [place], [place] [address]  CBC with Differential     Status: None   Collection Time: 11/18/21  2:58 PM  Result Value Ref Range   WBC 8.0 4.0 - 10.5 K/uL   RBC 4.78 4.22 - 5.81 MIL/uL   Hemoglobin 14.6 13.0 - 17.0 g/dL   HCT 42.7 39.0 - 52.0 %   MCV 89.3 80.0 - 100.0 fL   MCH 30.5 26.0 - 34.0 pg   MCHC 34.2 30.0 - 36.0 g/dL   RDW 13.3 11.5 - 15.5 %   Platelets 205 150 - 400 K/uL   nRBC 0.0 0.0 - 0.2 %   Neutrophils Relative % 68 %   Neutro Abs 5.4 1.7 - 7.7 K/uL   Lymphocytes Relative 24 %   Lymphs Abs 2.0 0.7 - 4.0 K/uL   Monocytes Relative 6 %   Monocytes Absolute  0.5 0.1 - 1.0 K/uL   Eosinophils Relative 1 %   Eosinophils Absolute 0.1 0.0 - 0.5 K/uL   Basophils Relative 0 %   Basophils Absolute 0.0 0.0 - 0.1 K/uL   Immature Granulocytes 1 %   Abs Immature Granulocytes 0.06 0.00 - 0.07 K/uL    Comment: Performed at [facility], [address], [place], [place] [address]  Heparin level (unfractionated)     Status: None   Collection Time: 11/19/21  4:07 AM  Result Value Ref Range   Heparin Unfractionated 0.57 0.30 - 0.70 IU/mL    Comment: (NOTE) The clinical reportable range upper limit is being lowered to >1.10 to align with the FDA approved guidance for the current laboratory assay.  If heparin results are below expected values, and patient dosage has  been confirmed, suggest follow up testing of antithrombin III levels. Performed at [facility], [place] [address], [place], [place] [address]   HIV Antibody (routine testing w rflx)     Status: None   Collection Time: 11/19/21  4:07 AM  Result Value Ref Range   HIV Screen 4th Generation wRfx Non Reactive Non Reactive    Comment: Performed at [facility], [place] [address] [place], [place] [address]  Basic metabolic panel     Status: Abnormal   Collection Time: 11/19/21  4:07 AM  Result Value Ref Range   Sodium 130 (L) 135 - 145 mmol/L    Comment: DELTA CHECK NOTED   Potassium 4.2 3.5 - 5.1 mmol/L   Chloride 101 98 - 111 mmol/L   CO2 21 (L) 22 - 32 mmol/L   Glucose, Bld 256 (H) 70 - 99 mg/dL    Comment: Glucose reference range applies only to samples taken after fasting for at least 8 hours.   BUN 17 8 - 23 mg/dL   Creatinine, Ser 1.00 0.61 - 1.24 mg/dL   Calcium 8.4 (L) 8.9 - 10.3 mg/dL   GFR, Estimated >60 >60 mL/min    Comment: (NOTE) Calculated using the CKD-EPI Creatinine Equation (2021)    Anion gap 8 5 - 15    Comment: Performed at [facility] [address], [place], [place] [address]  Hemoglobin A1c     Status: Abnormal   Collection Time: 11/19/21   4:07 AM  Result Value Ref Range   Hgb A1c MFr Bld 8.2 (H) 4.8 - 5.6 %    Comment: (NOTE) Pre diabetes:          5.7%-6.4%  Diabetes:              >  6.4%  Glycemic control for   <7.0% adults with diabetes    Mean Plasma Glucose 188.64 mg/dL    Comment: Performed at [facility] [address] [place], [place] [address]  Heparin level (unfractionated)     Status: Abnormal   Collection Time: 11/19/21  5:49 AM  Result Value Ref Range   Heparin Unfractionated 0.89 (H) 0.30 - 0.70 IU/mL    Comment: (NOTE) The clinical reportable range upper limit is being lowered to >1.10 to align with the FDA approved guidance for the current laboratory assay.  If heparin results are below expected values, and patient dosage has  been confirmed, suggest follow up testing of antithrombin III levels. Performed at [facility], [place] [address] [place], [place] [address]    No results found for this or any previous visit (from the past 240 hour(s)). Creatinine: Recent Labs    11/18/21 1458 11/19/21 0407  CREATININE 1.16 1.00    Xrays: See report/chart As noted reviewed  Impression/Assessment:  Picture drawn; Patient has 1.5 cm stone asymptomatic left upjx; renal function good; eventual tx with be ESWL or possible uscope   Plan:  Recommend watchful waiting and treat as an outpatient in the midst of acute emboli Patient agreed with plan PLEASE HAVE PATIENT SEE DR [place] Let Urology know if gets acute flank pain, fever, etc.  Tajanay Hurley A Junior Huezo 11/19/2021, 10:12 AM

## 2021-11-20 DIAGNOSIS — I480 Paroxysmal atrial fibrillation: Secondary | ICD-10-CM | POA: Diagnosis not present

## 2021-11-20 DIAGNOSIS — E1169 Type 2 diabetes mellitus with other specified complication: Secondary | ICD-10-CM | POA: Diagnosis not present

## 2021-11-20 DIAGNOSIS — I82423 Acute embolism and thrombosis of iliac vein, bilateral: Secondary | ICD-10-CM | POA: Diagnosis not present

## 2021-11-20 DIAGNOSIS — I82401 Acute embolism and thrombosis of unspecified deep veins of right lower extremity: Secondary | ICD-10-CM | POA: Diagnosis not present

## 2021-11-20 LAB — CBC
HCT: 38.1 % — ABNORMAL LOW (ref 39.0–52.0)
Hemoglobin: 12.8 g/dL — ABNORMAL LOW (ref 13.0–17.0)
MCH: 30 pg (ref 26.0–34.0)
MCHC: 33.6 g/dL (ref 30.0–36.0)
MCV: 89.4 fL (ref 80.0–100.0)
Platelets: 157 10*3/uL (ref 150–400)
RBC: 4.26 MIL/uL (ref 4.22–5.81)
RDW: 13.4 % (ref 11.5–15.5)
WBC: 7.9 10*3/uL (ref 4.0–10.5)
nRBC: 0 % (ref 0.0–0.2)

## 2021-11-20 LAB — BASIC METABOLIC PANEL
Anion gap: 8 (ref 5–15)
BUN: 23 mg/dL (ref 8–23)
CO2: 22 mmol/L (ref 22–32)
Calcium: 8.3 mg/dL — ABNORMAL LOW (ref 8.9–10.3)
Chloride: 103 mmol/L (ref 98–111)
Creatinine, Ser: 1.2 mg/dL (ref 0.61–1.24)
GFR, Estimated: >60 mL/min (ref >60)
Glucose, Bld: 195 mg/dL — ABNORMAL HIGH (ref 70–99)
Potassium: 4 mmol/L (ref 3.5–5.1)
Sodium: 133 mmol/L — ABNORMAL LOW (ref 135–145)

## 2021-11-20 LAB — GLUCOSE, CAPILLARY
Glucose-Capillary: 193 mg/dL — ABNORMAL HIGH (ref 70–99)
Glucose-Capillary: 219 mg/dL — ABNORMAL HIGH (ref 70–99)
Glucose-Capillary: 200 mg/dL — ABNORMAL HIGH (ref 70–99)
Glucose-Capillary: 229 mg/dL — ABNORMAL HIGH (ref 70–99)

## 2021-11-20 LAB — HEPARIN LEVEL (UNFRACTIONATED): Heparin Unfractionated: 0.39 IU/mL (ref 0.30–0.70)

## 2021-11-20 NOTE — Evaluation (Signed)
Physical Therapy Evaluation Patient Details Name: Nicholas Lam MRN: [id number] DOB: 08-19-1955 Today's Date: 11/20/2021  History of Present Illness  Pt is a 66 y.o. 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 Awaiting vascular sx recommendations for procedure. PMH includes PAF, s/p IVC filter (2009), DM2.   Clinical Impression  Pt presents with an overall decrease in functional mobility secondary to above. PTA, pt mod indep with rollator, lives alone, endorses falls due to "inner ear problems"; reports having CNA assist 1x/wk for household tasks. Today, pt able to transfer and ambulate with RW at supervision-level; DOE 3-4/4 with activity. Pt would benefit from continued acute PT services to maximize functional mobility and independence prior to d/c with HHPT services.     HR 145 with ambulation; SpO2 96% on RA    Recommendations for follow up therapy are one component of a multi-disciplinary discharge planning process, led by the attending physician.  Recommendations may be updated based on patient status, additional functional criteria and insurance authorization.  Follow Up Recommendations Home health PT    Assistance Recommended at Discharge PRN  Patient can return home with the following  A little help with bathing/dressing/bathroom;Assistance with cooking/housework    Equipment Recommendations None recommended by PT  Recommendations for Other Services       Functional Status Assessment Patient has had a recent decline in their functional status and demonstrates the ability to make significant improvements in function in a reasonable and predictable amount of time.     Precautions / Restrictions Precautions Precautions: Fall Restrictions Weight Bearing Restrictions: No      Mobility  Bed Mobility Overal bed mobility: Modified Independent             General bed mobility comments: HOB slightly elevated    Transfers Overall  transfer level: Needs assistance Equipment used: Rolling walker (2 wheels) Transfers: Sit to/from Stand Sit to Stand: Supervision           General transfer comment: cues for hand placement as pt pulling up on RW to power up    Ambulation/Gait Ambulation/Gait assistance: Min guard, Supervision Gait Distance (Feet): 120 Feet Assistive device: Rolling walker (2 wheels) Gait Pattern/deviations: Step-through pattern, Trunk flexed, Antalgic Gait velocity: Decreased     General Gait Details: slow, mostly steady gait with RW and initial min guard for balance, progressing to supervision; pt reports distance limited by fatigue, DOE 3-4/4, HR up to 145  Stairs            Wheelchair Mobility    Modified Rankin (Stroke Patients Only)       Balance Overall balance assessment: Needs assistance   Sitting balance-Leahy Scale: Fair       Standing balance-Leahy Scale: Fair Standing balance comment: can static stand without UE support, preference for walker                             Pertinent Vitals/Pain Pain Assessment Pain Assessment: Faces Faces Pain Scale: Hurts a little bit Pain Location: RLE Pain Descriptors / Indicators: Discomfort, Sore Pain Intervention(s): Monitored during session, Limited activity within patient's tolerance    Home Living Family/patient expects to be discharged to:: Private residence Living Arrangements: Alone Available Help at Discharge: Personal care attendant;Available PRN/intermittently Type of Home:  (condo) Home Access: Level entry       Home Layout: One level Home Equipment: Rollator (4 wheels);Grab bars - tub/shower;Adaptive equipment  Prior Function Prior Level of Function : Independent/Modified Independent             Mobility Comments: mod indep with rollator; endorses falls "I have an inner ear problem that affects my balance" ADLs Comments: CNA assist 1x/wk for household tasks as needed. typically uses  device to get socks on     Hand Dominance        Extremity/Trunk Assessment   Upper Extremity Assessment Upper Extremity Assessment: Overall WFL for tasks assessed    Lower Extremity Assessment Lower Extremity Assessment: Generalized weakness    Cervical / Trunk Assessment Cervical / Trunk Assessment: Kyphotic  Communication   Communication: No difficulties  Cognition Arousal/Alertness: Awake/alert Behavior During Therapy: WFL for tasks assessed/performed Overall Cognitive Status: No family/caregiver present to determine baseline cognitive functioning                                 General Comments: WFL for simple tasks; attention limited by tangential conversation requiring intermittent redirection to current topic/task; suspect baseline cognition        General Comments General comments (skin integrity, edema, etc.): HR up to 145 with ambulation, SpO2 96 on RA    Exercises     Assessment/Plan    PT Assessment Patient needs continued PT services  PT Problem List Decreased strength;Decreased activity tolerance;Decreased balance;Decreased mobility;Cardiopulmonary status limiting activity;Pain       PT Treatment Interventions DME instruction;Gait training;Functional mobility training;Therapeutic activities;Therapeutic exercise;Balance training;Patient/family education    PT Goals (Current goals can be found in the Care Plan section)  Acute Rehab PT Goals Patient Stated Goal: return home, interested in HHPT services PT Goal Formulation: With patient Time For Goal Achievement: 12/04/21 Potential to Achieve Goals: Good    Frequency Min 3X/week     Co-evaluation               AM-PAC PT "6 Clicks" Mobility  Outcome Measure Help needed turning from your back to your side while in a flat bed without using bedrails?: None Help needed moving from lying on your back to sitting on the side of a flat bed without using bedrails?: None Help needed  moving to and from a bed to a chair (including a wheelchair)?: A Little Help needed standing up from a chair using your arms (e.g., wheelchair or bedside chair)?: A Little Help needed to walk in hospital room?: A Little Help needed climbing 3-5 steps with a railing? : A Little 6 Click Score: 20    End of Session Equipment Utilized During Treatment: Gait belt Activity Tolerance: Patient tolerated treatment well Patient left: in chair;with call bell/phone within reach Nurse Communication: Mobility status PT Visit Diagnosis: Other abnormalities of gait and mobility (R26.89)    Time: 1610-9604 PT Time Calculation (min) (ACUTE ONLY): 17 min   Charges:   PT Evaluation $PT Eval Moderate Complexity: [place], PT, DPT Acute Rehabilitation Services  Pager [phone] Office [place] 11/20/2021, 9:55 AM

## 2021-11-20 NOTE — Progress Notes (Signed)
PROGRESS NOTE        PATIENT DETAILS Name: Nicholas Lam Age: 66 y.o. Sex: male Date of Birth: 17-Sep-1955 Admit Date: 11/18/2021 Admitting Physician Lenore Cordia, MD XBD:[id number], [name]  Brief Summary: Patient is a 66 y.o.  male with history of PAF, DM-2, left renal stone-who apparently had a IVC filter placed in 2009 to prevent VTE given his sedentary lifestyle-presented with significant right lower extremity swelling-found to have extensive venous thrombosis up to the level of IVC.   Significant events: 6/7>> admit to [facility]-RLE swelling-extensive DVT.  Significant studies: 6/7>> RLE Doppler: Extensive DVT involving multiple veins. 6/8>> CT venogram: Extensive DVT involving IVC, common/internal/external iliac bilaterally and common femoral vein on the right.  Moderate to severe obstructive uropathy with 1.5 cm calculus at the UPJ.  Left adrenal adenoma.  Significant microbiology data:   Procedures:   Consults: Vascular surgery.  Subjective: Right leg appears unchanged.  Objective: Vitals: Blood pressure (!) 152/76, pulse 66, temperature (!) 97.4 F (36.3 C), temperature source Oral, resp. rate 20, height '5\' 10"'$  (1.778 m), weight 113.5 kg, SpO2 98 %.   Exam: Gen Exam:Alert awake-not in any distress HEENT:atraumatic, normocephalic Chest: B/L clear to auscultation anteriorly CVS:S1S2 regular Abdomen:soft non tender, non distended Extremities: Significant swelling of his right lower extremity. Neurology: Non focal Skin: no rash  Pertinent Labs/Radiology:    Latest Ref Rng & Units 11/20/2021    2:46 AM 11/18/2021    2:58 PM  CBC  WBC 4.0 - 10.5 K/uL 7.9  8.0   Hemoglobin 13.0 - 17.0 g/dL 12.8  14.6   Hematocrit 39.0 - 52.0 % 38.1  42.7   Platelets 150 - 400 K/uL 157  205     Lab Results  Component Value Date   NA 133 (L) 11/20/2021   K 4.0 11/20/2021   CL 103 11/20/2021   CO2 22 11/20/2021       Assessment/Plan: Extensive RLE DVT-with left iliac vein DVT-and thrombosis extending up to the IVC: Continue IV heparin-vascular surgery following-thrombectomy planned-probably sometime next week.  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  If he develops symptoms/infection-we will need to reconsult urology.  PAF: Monitor on telemetry-on IV heparin.  DM-2 (A1c 8.2 on 6/8): Not a new diagnosis per patient-he previously was on metformin-but has been noncompliant-and does not take any medications at home.  Mechanical fall 2 weeks prior to this hospitalization: PT/OT eval  BMI: Estimated body mass index is 35.9 kg/m as calculated from the following:   Height as of this encounter: '5\' 10"'$  (1.778 m).   Weight as of this encounter: 113.5 kg.   Code status:   Code Status: Full Code   DVT Prophylaxis: IV heparin  Family Communication: None at bedside   Disposition Plan: Status is: Inpatient Remains inpatient appropriate because: Extensive VTE-thrombectomy planned next week-on IV heparin.   Planned Discharge Destination:Home   Diet: Diet Order             Diet regular Room service appropriate? Yes; Fluid consistency: Thin  Diet effective now                     Antimicrobial agents: Anti-infectives (From admission, onward)    None  MEDICATIONS: Scheduled Meds:  insulin aspart  0-9 Units Subcutaneous TID WC   sodium chloride flush  3 mL Intravenous Q12H   Continuous Infusions:  heparin 1,500 Units/hr (11/20/21 0023)   PRN Meds:.acetaminophen **OR** acetaminophen, ondansetron **OR** ondansetron (ZOFRAN) IV, senna-docusate   I have personally reviewed following labs and imaging studies  LABORATORY DATA: CBC: Recent Labs  Lab 11/18/21 1458 11/20/21 0246  WBC 8.0 7.9  NEUTROABS 5.4  --   HGB 14.6  12.8*  HCT 42.7 38.1*  MCV 89.3 89.4  PLT 205 737    Basic Metabolic Panel: Recent Labs  Lab 11/18/21 1458 11/19/21 0407 11/20/21 0246  NA 137 130* 133*  K 4.4 4.2 4.0  CL 105 101 103  CO2 25 21* 22  GLUCOSE 172* 256* 195*  BUN '19 17 23  '$ CREATININE 1.16 1.00 1.20  CALCIUM 9.1 8.4* 8.3*    GFR: Estimated Creatinine Clearance: 75.4 mL/min (by C-G formula based on SCr of 1.2 mg/dL).  Liver Function Tests: No results for input(s): "AST", "ALT", "ALKPHOS", "BILITOT", "PROT", "ALBUMIN" in the last 168 hours. No results for input(s): "LIPASE", "AMYLASE" in the last 168 hours. No results for input(s): "AMMONIA" in the last 168 hours.  Coagulation Profile: No results for input(s): "INR", "PROTIME" in the last 168 hours.  Cardiac Enzymes: No results for input(s): "CKTOTAL", "CKMB", "CKMBINDEX", "TROPONINI" in the last 168 hours.  BNP (last 3 results) No results for input(s): "PROBNP" in the last 8760 hours.  Lipid Profile: No results for input(s): "CHOL", "HDL", "LDLCALC", "TRIG", "CHOLHDL", "LDLDIRECT" in the last 72 hours.  Thyroid Function Tests: No results for input(s): "TSH", "T4TOTAL", "FREET4", "T3FREE", "THYROIDAB" in the last 72 hours.  Anemia Panel: No results for input(s): "VITAMINB12", "FOLATE", "FERRITIN", "TIBC", "IRON", "RETICCTPCT" in the last 72 hours.  Urine analysis: No results found for: "COLORURINE", "APPEARANCEUR", "LABSPEC", "PHURINE", "GLUCOSEU", "HGBUR", "BILIRUBINUR", "KETONESUR", "PROTEINUR", "UROBILINOGEN", "NITRITE", "LEUKOCYTESUR"  Sepsis Labs: Lactic Acid, Venous No results found for: "LATICACIDVEN"  MICROBIOLOGY: No results found for this or any previous visit (from the past 240 hour(s)).  RADIOLOGY STUDIES/RESULTS: CT VENOGRAM ABD/PEL  Addendum Date: 11/19/2021   ADDENDUM REPORT: 11/19/2021 03:28 ADDENDUM: Critical findings were reported to Dr. Alcario Drought at 3:27 a.m. Electronically Signed   By: Brett Fairy M.D.   On: 11/19/2021 03:28    Result Date: 11/19/2021 CLINICAL DATA:  Deep venous thrombosis. EXAM: CT VENOGRAM ABDOMEN AND PELVIS TECHNIQUE: Multidetector CT imaging of the abdomen and pelvis was performed using standard protocol during bolus administration of intravenous contrast. Multiplanar reconstructed images and MIPS were obtained and reviewed to evaluate the vascular anatomy. RADIATION DOSE REDUCTION: This exam was performed according to the departmental dose-optimization program which includes automated exposure control, adjustment of the mA and/or kV according to patient size and/or use of iterative reconstruction technique. CONTRAST:  166m OMNIPAQUE IOHEXOL 350 MG/ML SOLN COMPARISON:  None Available. FINDINGS: There is occlusion of the common femoral, external iliac, and common iliac veins on the right. Thrombus is present in the proximal external iliac and common iliac veins on the left. There is likely occlusive thrombus involving the internal iliac vein on the right and partial occlusion of the internal iliac vein on the left. There is thrombus in the IVC to the level of a IVC filter in the infrarenal region. Distal to the IVC filter. No thrombus is identified. The portal vein, superior mesenteric vein, and splenic vein are patent. Heart is enlarged and there is no pericardial effusion. Atelectasis and mild fibrotic changes are  noted at the lung bases. Subcentimeter hypodensities are present in the liver which are too small to further characterize. Hepatic steatosis is noted. The gallbladder is without stones. No biliary ductal dilatation. Pancreas is unremarkable. No pancreatic ductal dilatation or surrounding fat stranding. The spleen is mildly enlarged at 14 cm in length. No focal abnormality is seen. The right adrenal gland is within normal limits. There is a 2.2 cm nodule in the left adrenal gland with attenuation of -5 Hounsfield units, compatible with adenoma. The kidneys enhance symmetrically. Evaluation for renal  calculi is limited due to the presence of contrast at the renal pyramids bilaterally. There is moderate to severe hydronephrosis on the left with multiple calcifications in the left renal pelvis, the largest at the ureteropelvic junction measuring 1.5 cm. No obstructive uropathy on the right. Bladder is within normal limits. The stomach is within normal limits. No bowel obstruction, free air, or pneumatosis. Scattered diverticula are present along the colon without evidence of diverticulitis. The appendix is not visualized on exam. No focal bowel wall thickening. There is atherosclerotic calcification of the aorta without evidence of aneurysm. No abdominal or pelvic lymphadenopathy. Prostate gland is unremarkable. There small fat containing inguinal hernias bilaterally. Evidence of prior abdominal wall hernia repairs is noted. There is a fat containing anterior abdominal wall hernia in the midline adjacent to prior hernia repair. No abdominopelvic ascites. A trace amount of edema is noted in the presacral space. Subcutaneous fat stranding is noted at the right hip laterally. Degenerative changes are present in the thoracolumbar spine. There are mild compression deformities in the superior endplate at L1 and superior endplate at L3, indeterminate in age. There are severe degenerative changes at the right hip and mild degenerative changes at the left hip. Sclerotic regions are present in the proximal right femur, likely bone infarct or enchondroma. IMPRESSION: 1. 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 The IVC thrombus extends to the level of a IVC filter. No thrombus is seen distal to the IVC filter. 2. 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 3. Left adrenal adenoma. 4. Subcutaneous fat stranding over the lateral aspect of the right hip, possible  edema, contusion, or infection. 5. Mild compression deformities at L1 and L3, indeterminate in age. 6. Aortic atherosclerosis. 7. Remaining chronic findings as described above. Electronically Signed: By: Brett Fairy M.D. On: 11/19/2021 03:13   VAS [place] LOWER EXTREMITY VENOUS (DVT) (7a-7p)  Result Date: 11/18/2021  Lower Venous DVT Study Patient Name:  BENEN WEIDA  Date of Exam:   11/18/2021 Medical Rec #: [id number]      Accession #:    [id number] Date of Birth: [birth year]     Patient Gender: M Patient Age:   51 years Exam Location:  [facility] Procedure:      VAS [place] LOWER EXTREMITY VENOUS (DVT) Referring Phys: HALEY SAGE --------------------------------------------------------------------------------  Indications: Swelling, and Pain.  Risk Factors: Patient had IVC filter placed in 2009. States he was diagnosed to DVT without imaging study - only through elevated D-dimer. Limitations: Body habitus and poor ultrasound/tissue interface. Comparison Study: No previous exams Performing Technologist: Jody Hill RVT, RDMS  Examination Guidelines: A complete evaluation includes B-mode imaging, spectral Doppler, color Doppler, and power Doppler as needed of all accessible portions of each vessel. Bilateral testing is considered an integral part of a complete examination. Limited examinations  for reoccurring indications may be performed as noted. The reflux portion of the exam is performed with the patient in reverse Trendelenburg.  +---------+---------------+---------+-----------+----------+--------------+ RIGHT    CompressibilityPhasicitySpontaneityPropertiesThrombus Aging +---------+---------------+---------+-----------+----------+--------------+ CFV      None           No       No                   Acute          +---------+---------------+---------+-----------+----------+--------------+ SFJ      None                                         Acute           +---------+---------------+---------+-----------+----------+--------------+ FV Prox  None           No       No                   Acute          +---------+---------------+---------+-----------+----------+--------------+ FV Mid   None           No       No                   Acute          +---------+---------------+---------+-----------+----------+--------------+ FV DistalNone           No       No                   Acute          +---------+---------------+---------+-----------+----------+--------------+ PFV      None           No       No                   Acute          +---------+---------------+---------+-----------+----------+--------------+ POP      None           No       No                   Acute          +---------+---------------+---------+-----------+----------+--------------+ Gastroc  None           No       No                   Acute          +---------+---------------+---------+-----------+----------+--------------+ GSV      None           No       No                   Acute          +---------+---------------+---------+-----------+----------+--------------+ SSV      None           No       No                   Acute          +---------+---------------+---------+-----------+----------+--------------+   Right Technical Findings: Only able to see proximal portion of SSV due to immobility  +----+---------------+---------+-----------+----------+--------------+ LEFTCompressibilityPhasicitySpontaneityPropertiesThrombus Aging +----+---------------+---------+-----------+----------+--------------+ CFV Full           Yes  Yes                                 +----+---------------+---------+-----------+----------+--------------+ Rouleaux flow seen in CFV    Summary: RIGHT: - Findings consistent with acute deep vein thrombosis involving the right common femoral vein, SF junction, right femoral vein, right proximal profunda vein, right  popliteal vein, right posterior tibial veins, right peroneal veins, and right gastrocnemius veins. - Findings consistent with acute superficial vein thrombosis involving the right small saphenous vein, and right great saphenous vein. - No cystic structure found in the popliteal fossa.  LEFT: - No evidence of common femoral vein obstruction.  *See table(s) above for measurements and observations. Electronically signed by Harold Barban MD on 11/18/2021 at 10:56:30 PM.    Final    VAS [place] IVC/ILIAC (VENOUS ONLY)  Result Date: 11/18/2021 IVC/ILIAC STUDY Patient Name:  Tymon Nemetz  Date of Exam:   11/18/2021 Medical Rec #: [id number]      Accession #:    [id number] Date of Birth: [birth year]     Patient Gender: M Patient Age:   1 years Exam Location:  [facility] Procedure:      VAS [place] IVC/ILIAC (VENOUS ONLY) Referring Phys: --------------------------------------------------------------------------------  Indications: RLE DVT Limitations: Obesity and Abdominal hernia.  Comparison Study: No previous exams Performing Technologist: Jody Hill RVT, RDMS  Examination Guidelines: A complete evaluation includes B-mode imaging, spectral Doppler, color Doppler, and power Doppler as needed of all accessible portions of each vessel. Bilateral testing is considered an integral part of a complete examination. Limited examinations for reoccurring indications may be performed as noted.  IVC/Iliac Findings: +----------+------+--------+--------------+    IVC    PatentThrombus   Comments    +----------+------+--------+--------------+ IVC Prox                not visualized +----------+------+--------+--------------+ IVC Mid                 not visualized +----------+------+--------+--------------+ IVC Distal              not visualized +----------+------+--------+--------------+  +-------------------+---------+-----------+---------+-----------+--------+         CIV         RT-PatentRT-ThrombusLT-PatentLT-ThrombusComments +-------------------+---------+-----------+---------+-----------+--------+ Common Iliac Prox              acute               acute            +-------------------+---------+-----------+---------+-----------+--------+ Common Iliac Mid               acute               acute            +-------------------+---------+-----------+---------+-----------+--------+ Common Iliac Distal                                acute            +-------------------+---------+-----------+---------+-----------+--------+ Right distal CIV not visualized +-------------------------+---------+-----------+---------+-----------+--------+            EIV           RT-PatentRT-ThrombusLT-PatentLT-ThrombusComments +-------------------------+---------+-----------+---------+-----------+--------+ External Iliac Vein Prox             acute               acute            +-------------------------+---------+-----------+---------+-----------+--------+ External Iliac Vein  Mid              acute               acute            +-------------------------+---------+-----------+---------+-----------+--------+ External Iliac Vein                  acute               acute            Distal                                                                    +-------------------------+---------+-----------+---------+-----------+--------+  Non occlusive thrombus seen in left EIV  Summary: IVC/Iliac: There is evidence of acute thrombus involving the right common iliac vein. There is evidence of acute thrombus involving the left common iliac vein. There is evidence of acute thrombus involving the right external iliac vein. There is evidence  of nonacute thrombus involving the left external iliac vein. IVC not visualized due to overlying abdominal hernia.  *See table(s) above for measurements and observations.  Electronically signed by Harold Barban MD on  11/18/2021 at 10:54:34 PM.    Final      LOS: 2 days   Oren Binet, MD  [facility]    To contact the attending provider between 7A-7P or the covering provider during after hours 7P-7A, please log into the web site [email or web address] and access using universal Lane password for that web site. If you do not have the password, please call the hospital operator.  11/20/2021, 12:12 PM

## 2021-11-20 NOTE — Care Management Important Message (Signed)
Important Message  Patient Details  Name: Nicholas Lam MRN: [id number] Date of Birth: [birth year]   Medicare Important Message Given:  Yes     Shelda Altes 11/20/2021, 9:14 AM

## 2021-11-20 NOTE — Progress Notes (Addendum)
  Progress Note    11/20/2021 7:31 AM Hospital Day 1  Subjective:  says his leg is still swollen but maybe a bit better  Afebrile   Vitals:   11/19/21 2324 11/20/21 0404  BP: (!) 106/56 138/68  Pulse: 65 (!) 54  Resp: 12 13  Temp: 97.9 F (36.6 C) 97.7 F (36.5 C)  SpO2: 98% 99%    Physical Exam: General:  no distress Lungs:  non labored Extremities:  RLE swelling  CBC    Component Value Date/Time   WBC 7.9 11/20/2021 0246   RBC 4.26 11/20/2021 0246   HGB 12.8 (L) 11/20/2021 0246   HCT 38.1 (L) 11/20/2021 0246   PLT 157 11/20/2021 0246   MCV 89.4 11/20/2021 0246   MCH 30.0 11/20/2021 0246   MCHC 33.6 11/20/2021 0246   RDW 13.4 11/20/2021 0246   LYMPHSABS 2.0 11/18/2021 1458   MONOABS 0.5 11/18/2021 1458   EOSABS 0.1 11/18/2021 1458   BASOSABS 0.0 11/18/2021 1458    BMET    Component Value Date/Time   NA 133 (L) 11/20/2021 0246   K 4.0 11/20/2021 0246   CL 103 11/20/2021 0246   CO2 22 11/20/2021 0246   GLUCOSE 195 (H) 11/20/2021 0246   BUN 23 11/20/2021 0246   CREATININE 1.20 11/20/2021 0246   CALCIUM 8.3 (L) 11/20/2021 0246   GFRNONAA >60 11/20/2021 0246    INR No results found for: "INR"   Intake/Output Summary (Last 24 hours) at 11/20/2021 0731 Last data filed at 11/20/2021 0409 Gross per 24 hour  Intake 318.75 ml  Output 400 ml  Net -81.25 ml     Assessment/Plan:  66 y.o. male with extensive RLE DVT also into IVC and left iliac vein Hospital Day 1  -Dr. Trula Slade feels he would benefit from intervention.  He will determine timing of procedure. -ok for pt to mobilize but when he is in bed, please elevate his right leg.  I have discussed with pt proper elevation.   -continue heparin   Leontine Locket, PA-C [facility] [phone] 11/20/2021 7:31 AM  I have independently interviewed and examined patient and agree with PA assessment and plan above.  I again reiterated with the patient his options to keep we will remove the  filter but will definitely need thrombectomy at least with continued anticoagulation after.  We will plan for thrombectomy on Tuesday next week as long as he remains stable.  I will be available throughout the weekend as needed.  Jeshurun [place] C. Donzetta Matters, MD [facility] [place] Office: [phone] Pager: [phone]

## 2021-11-20 NOTE — Progress Notes (Signed)
Pharmacy consult- follow-up Indication: DVT  Allergies  Allergen Reactions   Iodinated Contrast Media Hives, Rash and Swelling   Iodine     Patient Measurements: Height: '5\' 10"'$  (177.8 cm) Weight: 113.5 kg (250 lb 3.6 oz) IBW/kg (Calculated) : 73 Heparin Dosing Weight: 97 kg  Actual bodyweight 109 kg -patient reports weighing himself weekly   Vital Signs: Temp: 97.7 F (36.5 C) (06/09 0404) Temp Source: Oral (06/09 0404) BP: 138/68 (06/09 0404) Pulse Rate: 54 (06/09 0404)  Labs: Recent Labs    11/18/21 1458 11/19/21 0407 11/19/21 0407 11/19/21 0549 11/19/21 1504 11/20/21 0246  HGB 14.6  --   --   --   --  12.8*  HCT 42.7  --   --   --   --  38.1*  PLT 205  --   --   --   --  157  HEPARINUNFRC  --  0.57   < > 0.89* 0.47 0.39  CREATININE 1.16 1.00  --   --   --  1.20   < > = values in this interval not displayed.     Estimated Creatinine Clearance: 75.4 mL/min (by C-G formula based on SCr of 1.2 mg/dL).  Assessment: Nicholas Lam a 66 y.o. male presented with 3 days of R leg swelling and warmth. 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    Anticoagulation PTA: Patient not on anticoagulation PTA.  Heparin level therapeutic  Goal of Therapy:  Heparin level 0.3-0.7 units/ml Monitor platelets by anticoagulation protocol: Yes   Plan:  Continue heparin gtt at 1500 units/hr Daily heparin level, CBC, s/s bleeding  Ripley Bogosian BS, PharmD, BCPS Clinical Pharmacist 11/20/2021 7:35 AM  Contact: [phone] after 3 PM  "Be curious, not judgmental..." -Jamal Maes

## 2021-11-21 DIAGNOSIS — E1169 Type 2 diabetes mellitus with other specified complication: Secondary | ICD-10-CM | POA: Diagnosis not present

## 2021-11-21 DIAGNOSIS — I82423 Acute embolism and thrombosis of iliac vein, bilateral: Secondary | ICD-10-CM | POA: Diagnosis not present

## 2021-11-21 DIAGNOSIS — R739 Hyperglycemia, unspecified: Secondary | ICD-10-CM

## 2021-11-21 DIAGNOSIS — I480 Paroxysmal atrial fibrillation: Secondary | ICD-10-CM | POA: Diagnosis not present

## 2021-11-21 LAB — CBC
HCT: 35 % — ABNORMAL LOW (ref 39.0–52.0)
Hemoglobin: 11.8 g/dL — ABNORMAL LOW (ref 13.0–17.0)
MCH: 29.9 pg (ref 26.0–34.0)
MCHC: 33.7 g/dL (ref 30.0–36.0)
MCV: 88.8 fL (ref 80.0–100.0)
Platelets: 162 10*3/uL (ref 150–400)
RBC: 3.94 MIL/uL — ABNORMAL LOW (ref 4.22–5.81)
RDW: 13.5 % (ref 11.5–15.5)
WBC: 7.1 10*3/uL (ref 4.0–10.5)
nRBC: 0 % (ref 0.0–0.2)

## 2021-11-21 LAB — HEPARIN LEVEL (UNFRACTIONATED): Heparin Unfractionated: 0.35 IU/mL (ref 0.30–0.70)

## 2021-11-21 LAB — GLUCOSE, CAPILLARY
Glucose-Capillary: 173 mg/dL — ABNORMAL HIGH (ref 70–99)
Glucose-Capillary: 221 mg/dL — ABNORMAL HIGH (ref 70–99)
Glucose-Capillary: 215 mg/dL — ABNORMAL HIGH (ref 70–99)
Glucose-Capillary: 192 mg/dL — ABNORMAL HIGH (ref 70–99)

## 2021-11-21 NOTE — Progress Notes (Signed)
PROGRESS NOTE        PATIENT DETAILS Name: Nicholas Lam Age: 66 y.o. Sex: male Date of Birth: [birth year] Admit Date: 11/18/2021 Admitting Physician Lenore Cordia, MD WYO:[id number], [name]  Brief Summary: Patient is a 66 y.o.  male with history of PAF, DM-2, left renal stone-who apparently had a IVC filter placed in 2009 to prevent VTE given his sedentary lifestyle-presented with significant right lower extremity swelling-found to have extensive venous thrombosis up to the level of IVC.   Significant events: 6/7>> admit to TRH-RLE swelling-extensive DVT.  Significant studies: 6/7>> RLE Doppler: Extensive DVT involving multiple veins. 6/8>> CT venogram: Extensive DVT involving IVC, common/internal/external iliac bilaterally and common femoral vein on the right.  Moderate to severe obstructive uropathy with 1.5 cm calculus at the UPJ.  Left adrenal adenoma.  Significant microbiology data:   Procedures:   Consults: Vascular surgery.  Subjective: No chest pain shortness of breath-RLE appears unchanged.  Objective: Vitals: Blood pressure (!) 135/54, pulse 63, temperature 98.1 F (36.7 C), temperature source Oral, resp. rate 15, height '5\' 10"'$  (1.778 m), weight 115.6 kg, SpO2 99 %.   Exam: Gen Exam:Alert awake-not in any distress HEENT:atraumatic, normocephalic Chest: B/L clear to auscultation anteriorly CVS:S1S2 regular Abdomen:soft non tender, non distended Extremities: RLE swelling unchanged Neurology: Non focal Skin: no rash   Pertinent Labs/Radiology:    Latest Ref Rng & Units 11/21/2021   12:45 AM 11/20/2021    2:46 AM 11/18/2021    2:58 PM  CBC  WBC 4.0 - 10.5 K/uL 7.1  7.9  8.0   Hemoglobin 13.0 - 17.0 g/dL 11.8  12.8  14.6   Hematocrit 39.0 - 52.0 % 35.0  38.1  42.7   Platelets 150 - 400 K/uL 162  157  205     Lab Results  Component Value Date   NA 133 (L) 11/20/2021   K 4.0 11/20/2021   CL 103 11/20/2021   CO2 22  11/20/2021      Assessment/Plan: Extensive RLE DVT-with left iliac vein DVT-and thrombosis extending up to the IVC: On IV heparin-vascular surgery following-thrombectomy planned early next week.    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  If he develops symptoms/infection-we will need to reconsult urology.  PAF: Monitor on telemetry-on IV heparin.  DM-2 (A1c 8.2 on 6/8): Not a new diagnosis per patient-he previously was on metformin-but has been noncompliant-and does not take any medications at home.  Recent Labs    11/20/21 1655 11/20/21 2109 11/21/21 0616  GLUCAP 200* 229* 173*     Mechanical fall 2 weeks prior to this hospitalization: PT/OT eval  BMI: Estimated body mass index is 36.57 kg/m as calculated from the following:   Height as of this encounter: '5\' 10"'$  (1.778 m).   Weight as of this encounter: 115.6 kg.   Code status:   Code Status: Full Code   DVT Prophylaxis: IV heparin  Family Communication: None at bedside   Disposition Plan: Status is: Inpatient Remains inpatient appropriate because: Extensive VTE-thrombectomy planned next week-on IV heparin-thrombectomy early next week-not yet stable for discharge.   Planned Discharge Destination:Home   Diet: Diet Order             Diet regular Room service appropriate? Yes;  Fluid consistency: Thin  Diet effective now                     Antimicrobial agents: Anti-infectives (From admission, onward)    None        MEDICATIONS: Scheduled Meds:  insulin aspart  0-9 Units Subcutaneous TID WC   sodium chloride flush  3 mL Intravenous Q12H   Continuous Infusions:  heparin 1,500 Units/hr (11/20/21 1837)   PRN Meds:.acetaminophen **OR** acetaminophen, ondansetron **OR** ondansetron (ZOFRAN) IV, senna-docusate   I have personally reviewed following  labs and imaging studies  LABORATORY DATA: CBC: Recent Labs  Lab 11/18/21 1458 11/20/21 0246 11/21/21 0045  WBC 8.0 7.9 7.1  NEUTROABS 5.4  --   --   HGB 14.6 12.8* 11.8*  HCT 42.7 38.1* 35.0*  MCV 89.3 89.4 88.8  PLT 205 157 162     Basic Metabolic Panel: Recent Labs  Lab 11/18/21 1458 11/19/21 0407 11/20/21 0246  NA 137 130* 133*  K 4.4 4.2 4.0  CL 105 101 103  CO2 25 21* 22  GLUCOSE 172* 256* 195*  BUN '19 17 23  '$ CREATININE 1.16 1.00 1.20  CALCIUM 9.1 8.4* 8.3*     GFR: Estimated Creatinine Clearance: 76 mL/min (by C-G formula based on SCr of 1.2 mg/dL).  Liver Function Tests: No results for input(s): "AST", "ALT", "ALKPHOS", "BILITOT", "PROT", "ALBUMIN" in the last 168 hours. No results for input(s): "LIPASE", "AMYLASE" in the last 168 hours. No results for input(s): "AMMONIA" in the last 168 hours.  Coagulation Profile: No results for input(s): "INR", "PROTIME" in the last 168 hours.  Cardiac Enzymes: No results for input(s): "CKTOTAL", "CKMB", "CKMBINDEX", "TROPONINI" in the last 168 hours.  BNP (last 3 results) No results for input(s): "PROBNP" in the last 8760 hours.  Lipid Profile: No results for input(s): "CHOL", "HDL", "LDLCALC", "TRIG", "CHOLHDL", "LDLDIRECT" in the last 72 hours.  Thyroid Function Tests: No results for input(s): "TSH", "T4TOTAL", "FREET4", "T3FREE", "THYROIDAB" in the last 72 hours.  Anemia Panel: No results for input(s): "VITAMINB12", "FOLATE", "FERRITIN", "TIBC", "IRON", "RETICCTPCT" in the last 72 hours.  Urine analysis: No results found for: "COLORURINE", "APPEARANCEUR", "LABSPEC", "PHURINE", "GLUCOSEU", "HGBUR", "BILIRUBINUR", "KETONESUR", "PROTEINUR", "UROBILINOGEN", "NITRITE", "LEUKOCYTESUR"  Sepsis Labs: Lactic Acid, Venous No results found for: "LATICACIDVEN"  MICROBIOLOGY: No results found for this or any previous visit (from the past 240 hour(s)).  RADIOLOGY STUDIES/RESULTS: No results found.   LOS: 3  days   Oren Binet, MD  [facility]    To contact the attending provider between 7A-7P or the covering provider during after hours 7P-7A, please log into the web site [email or web address] and access using universal [place] password for that web site. If you do not have the password, please call the hospital operator.  11/21/2021, 10:51 AM

## 2021-11-21 NOTE — Progress Notes (Signed)
Pharmacy consult- follow-up Indication: DVT  Allergies  Allergen Reactions   Iodinated Contrast Media Hives, Rash and Swelling   Iodine     Patient Measurements: Height: '5\' 10"'$  (177.8 cm) Weight: 115.6 kg (254 lb 13.6 oz) IBW/kg (Calculated) : 73 Heparin Dosing Weight: 97 kg  Actual bodyweight 109 kg -patient reports weighing himself weekly   Vital Signs: Temp: 98.1 F (36.7 C) (06/10 0358) Temp Source: Axillary (06/10 0358) BP: 120/62 (06/10 0358) Pulse Rate: 61 (06/10 0358)  Labs: Recent Labs    11/18/21 1458 11/19/21 0407 11/19/21 0549 11/19/21 1504 11/20/21 0246 11/21/21 0045  HGB 14.6  --   --   --  12.8* 11.8*  HCT 42.7  --   --   --  38.1* 35.0*  PLT 205  --   --   --  157 162  HEPARINUNFRC  --  0.57   < > 0.47 0.39 0.35  CREATININE 1.16 1.00  --   --  1.20  --    < > = values in this interval not displayed.     Estimated Creatinine Clearance: 76 mL/min (by C-G formula based on SCr of 1.2 mg/dL).  Assessment: Nicholas Lam a 66 y.o. male presented with 3 days of R leg swelling and warmth. 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 Pharmacy has been consulted for heparin dosing for VTE.    Anticoagulation PTA: Patient not on anticoagulation PTA.  Heparin level therapeutic at 0.35 on 1500 units/hr. Hgb dropped 12.8 to 11.8 today. Plts wnl. Plan for vascular intervention next week.  Goal of Therapy:  Heparin level 0.3-0.7 units/ml Monitor platelets by anticoagulation protocol: Yes   Plan:  Continue heparin gtt at 1500 units/hr Daily heparin level, CBC, s/s bleeding  Cathrine Muster, PharmD PGY2 Cardiology Pharmacy Resident Phone: [phone] 11/21/2021  7:19 AM Please check [email or web address] for unit-specific pharmacy phone numbers.

## 2021-11-21 NOTE — Evaluation (Signed)
Occupational Therapy Evaluation Patient Details Name: Nicholas Lam MRN: [id number] DOB: Feb 24, 1956 Today's Date: 11/21/2021   History of Present Illness Pt is a 66 y.o. 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 Awaiting vascular sx recommendations for procedure. PMH includes PAF, s/p IVC filter (2009), DM2.   Clinical Impression   Patient admitted for the diagnosis above.  Surgery is planned for sometime next week.  OT will continue to follow in the acute setting, but [facility] OT is recommended for post acute rehab.  He currently presents with impaired balance and poor activity tolerance.  The patient is needing up to Mod A for lower body ADL and Min A for basic mobility.        Recommendations for follow up therapy are one component of a multi-disciplinary discharge planning process, led by the attending physician.  Recommendations may be updated based on patient status, additional functional criteria and insurance authorization.   Follow Up Recommendations  Home health OT    Assistance Recommended at Discharge Intermittent Supervision/Assistance  Patient can return home with the following      Functional Status Assessment  Patient has had a recent decline in their functional status and demonstrates the ability to make significant improvements in function in a reasonable and predictable amount of time.  Equipment Recommendations  None recommended by OT    Recommendations for Other Services       Precautions / Restrictions Precautions Precautions: Fall Restrictions Weight Bearing Restrictions: No      Mobility Bed Mobility Overal bed mobility: Modified Independent             General bed mobility comments: HOB slightly elevated    Transfers Overall transfer level: Needs assistance Equipment used: Rolling walker (2 wheels) Transfers: Sit to/from Stand Sit to Stand: Min assist                  Balance Overall balance  assessment: Needs assistance Sitting-balance support: Feet supported Sitting balance-Leahy Scale: Fair     Standing balance support: Reliant on assistive device for balance Standing balance-Leahy Scale: Fair Standing balance comment: can static stand without UE support, preference for walker                           ADL either performed or assessed with clinical judgement   ADL       Grooming: Wash/dry hands;Wash/dry face;Standing;Min guard;Set up           Upper Body Dressing : Set up;Sitting   Lower Body Dressing: Moderate assistance;Sit to/from stand   Toilet Transfer: Min guard;Rolling walker (2 wheels);Regular Toilet;Ambulation                   Vision Patient Visual Report: No change from baseline Additional Comments: R eye does not move     Perception Perception Perception: Within Functional Limits   Praxis Praxis Praxis: Intact    Pertinent Vitals/Pain Pain Assessment Pain Assessment: No/denies pain Pain Intervention(s): Monitored during session     Hand Dominance Right   Extremity/Trunk Assessment Upper Extremity Assessment Upper Extremity Assessment: Overall WFL for tasks assessed   Lower Extremity Assessment Lower Extremity Assessment: Defer to PT evaluation   Cervical / Trunk Assessment Cervical / Trunk Assessment: Kyphotic   Communication Communication Communication: No difficulties   Cognition Arousal/Alertness: Awake/alert Behavior During Therapy: WFL for tasks assessed/performed Overall Cognitive Status: Within Functional Limits for tasks assessed  General Comments   HR to 140 with movement.    Exercises     Shoulder Instructions      Home Living Family/patient expects to be discharged to:: Private residence Living Arrangements: Alone Available Help at Discharge: Personal care attendant;Available PRN/intermittently   Home Access: Level entry     Home  Layout: One level     Bathroom Shower/Tub: Occupational psychologist: Standard Bathroom Accessibility: Yes How Accessible: Accessible via walker Home Equipment: Rollator (4 wheels);Grab bars - tub/shower;Adaptive equipment Adaptive Equipment: Sock aid        Prior Functioning/Environment                 ADLs Comments: CNA assist 1x/wk for household tasks as needed. typically uses device to get socks on        OT Problem List: Decreased activity tolerance;Impaired balance (sitting and/or standing);Decreased knowledge of use of DME or AE;Decreased safety awareness;Pain      OT Treatment/Interventions: Self-care/ADL training;Therapeutic activities;Patient/family education;Balance training;DME and/or AE instruction    OT Goals(Current goals can be found in the care plan section) Acute Rehab OT Goals Patient Stated Goal: Get back home OT Goal Formulation: With patient Time For Goal Achievement: 12/04/21 Potential to Achieve Goals: Good ADL Goals Pt Will Perform Grooming: with modified independence;standing Pt Will Perform Lower Body Dressing: with modified independence;sit to/from stand Pt Will Transfer to Toilet: with modified independence;ambulating;regular height toilet  OT Frequency: Min 2X/week    Co-evaluation              AM-PAC OT "6 Clicks" Daily Activity     Outcome Measure Help from another person eating meals?: None Help from another person taking care of personal grooming?: None Help from another person toileting, which includes using toliet, bedpan, or urinal?: A Little Help from another person bathing (including washing, rinsing, drying)?: A Lot Help from another person to put on and taking off regular upper body clothing?: A Little Help from another person to put on and taking off regular lower body clothing?: A Lot 6 Click Score: 18   End of Session Equipment Utilized During Treatment: Rolling walker (2 wheels);Gait belt Nurse  Communication: Mobility status  Activity Tolerance: Patient limited by fatigue Patient left: in chair;with call bell/phone within reach  OT Visit Diagnosis: Unsteadiness on feet (R26.81);Pain Pain - Right/Left: Right Pain - part of body: Leg                Time: 4496-7591 OT Time Calculation (min): 22 min Charges:  OT General Charges $OT Visit: 1 Visit OT Evaluation $OT Eval Moderate Complexity: 1 Mod  11/21/2021  RP, OTR/L  Acute Rehabilitation Services  Office:  [phone]   Metta Clines 11/21/2021, 12:05 PM

## 2021-11-21 NOTE — Progress Notes (Signed)
Mobility Specialist Progress Note    11/21/21 1343  Mobility  Activity Transferred from chair to bed  Level of Assistance Contact guard assist, steadying assist  Assistive Device Front wheel walker  Activity Response Tolerated well  $Mobility charge 1 Mobility   Left with call bell in reach and NT present.   Hildred Alamin Mobility Specialist

## 2021-11-22 DIAGNOSIS — E1169 Type 2 diabetes mellitus with other specified complication: Secondary | ICD-10-CM | POA: Diagnosis not present

## 2021-11-22 DIAGNOSIS — I480 Paroxysmal atrial fibrillation: Secondary | ICD-10-CM | POA: Diagnosis not present

## 2021-11-22 DIAGNOSIS — R2241 Localized swelling, mass and lump, right lower limb: Secondary | ICD-10-CM

## 2021-11-22 DIAGNOSIS — I82423 Acute embolism and thrombosis of iliac vein, bilateral: Secondary | ICD-10-CM | POA: Diagnosis not present

## 2021-11-22 DIAGNOSIS — R739 Hyperglycemia, unspecified: Secondary | ICD-10-CM | POA: Diagnosis not present

## 2021-11-22 LAB — BASIC METABOLIC PANEL
Anion gap: 10 (ref 5–15)
BUN: 23 mg/dL (ref 8–23)
CO2: 22 mmol/L (ref 22–32)
Calcium: 8.5 mg/dL — ABNORMAL LOW (ref 8.9–10.3)
Chloride: 104 mmol/L (ref 98–111)
Creatinine, Ser: 1.22 mg/dL (ref 0.61–1.24)
GFR, Estimated: >60 mL/min (ref >60)
Glucose, Bld: 158 mg/dL — ABNORMAL HIGH (ref 70–99)
Potassium: 4 mmol/L (ref 3.5–5.1)
Sodium: 136 mmol/L (ref 135–145)

## 2021-11-22 LAB — CBC
HCT: 35.9 % — ABNORMAL LOW (ref 39.0–52.0)
Hemoglobin: 12.3 g/dL — ABNORMAL LOW (ref 13.0–17.0)
MCH: 30.9 pg (ref 26.0–34.0)
MCHC: 34.3 g/dL (ref 30.0–36.0)
MCV: 90.2 fL (ref 80.0–100.0)
Platelets: 159 10*3/uL (ref 150–400)
RBC: 3.98 MIL/uL — ABNORMAL LOW (ref 4.22–5.81)
RDW: 13.7 % (ref 11.5–15.5)
WBC: 7 10*3/uL (ref 4.0–10.5)
nRBC: 0.3 % — ABNORMAL HIGH (ref 0.0–0.2)

## 2021-11-22 LAB — GLUCOSE, CAPILLARY
Glucose-Capillary: 175 mg/dL — ABNORMAL HIGH (ref 70–99)
Glucose-Capillary: 142 mg/dL — ABNORMAL HIGH (ref 70–99)
Glucose-Capillary: 200 mg/dL — ABNORMAL HIGH (ref 70–99)
Glucose-Capillary: 190 mg/dL — ABNORMAL HIGH (ref 70–99)

## 2021-11-22 LAB — HEPARIN LEVEL (UNFRACTIONATED): Heparin Unfractionated: 0.32 IU/mL (ref 0.30–0.70)

## 2021-11-22 NOTE — Progress Notes (Signed)
PROGRESS NOTE        PATIENT DETAILS Name: Nicholas Lam Age: 66 y.o. Sex: male Date of Birth: 11-25-1955 Admit Date: 11/18/2021 Admitting Physician Lenore Cordia, MD LKG:[id number], [name]  Brief Summary: Patient is a 66 y.o.  male with history of PAF, DM-2, left renal stone-who apparently had a IVC filter placed in 2009 to prevent VTE given his sedentary lifestyle-presented with significant right lower extremity swelling-found to have extensive venous thrombosis up to the level of IVC.   Significant events: 6/7>> admit to TRH-RLE swelling-extensive DVT.  Significant studies: 6/7>> RLE Doppler: Extensive DVT involving multiple veins. 6/8>> CT venogram: Extensive DVT involving IVC, common/internal/external iliac bilaterally and common femoral vein on the right.  Moderate to severe obstructive uropathy with 1.5 cm calculus at the UPJ.  Left adrenal adenoma.  Significant microbiology data:   Procedures:   Consults: Vascular surgery.  Subjective: Some decrease in RLE swelling.  Objective: Vitals: Blood pressure 137/72, pulse (!) 55, temperature 97.6 F (36.4 C), temperature source Oral, resp. rate 15, height '5\' 10"'$  (1.778 m), weight 113.2 kg, SpO2 97 %.   Exam: Gen Exam:Alert awake-not in any distress HEENT:atraumatic, normocephalic Chest: B/L clear to auscultation anteriorly CVS:S1S2 regular Abdomen:soft non tender, non distended Extremities: Some decrease in RLE swelling. Neurology: Non focal Skin: no rash   Pertinent Labs/Radiology:    Latest Ref Rng & Units 11/22/2021    1:52 AM 11/21/2021   12:45 AM 11/20/2021    2:46 AM  CBC  WBC 4.0 - 10.5 K/uL 7.0  7.1  7.9   Hemoglobin 13.0 - 17.0 g/dL 12.3  11.8  12.8   Hematocrit 39.0 - 52.0 % 35.9  35.0  38.1   Platelets 150 - 400 K/uL 159  162  157     Lab Results  Component Value Date   NA 136 11/22/2021   K 4.0 11/22/2021   CL 104 11/22/2021   CO2 22 11/22/2021       Assessment/Plan: Extensive RLE DVT-with left iliac vein DVT-and thrombosis extending up to the IVC: On IV heparin-vascular surgery following-thrombectomy planned on Tuesday.  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  If he develops symptoms/infection-we will need to reconsult urology.  PAF: Monitor on telemetry-on IV heparin.  DM-2 (A1c 8.2 on 6/8): Not a new diagnosis per patient-he previously was on metformin-but has been noncompliant-and does not take any medications at home.  Recent Labs    11/21/21 1517 11/21/21 2100 11/22/21 0558  GLUCAP 215* 192* 175*      Mechanical fall 2 weeks prior to this hospitalization: PT/OT eval  BMI: Estimated body mass index is 35.81 kg/m as calculated from the following:   Height as of this encounter: '5\' 10"'$  (1.778 m).   Weight as of this encounter: 113.2 kg.   Code status:   Code Status: Full Code   DVT Prophylaxis: IV heparin  Family Communication: None at bedside   Disposition Plan: Status is: Inpatient Remains inpatient appropriate because: Extensive VTE-thrombectomy planned next week-on IV heparin-thrombectomy planned for Tuesday.  Not stable for discharge.   Planned Discharge Destination:Home   Diet: Diet Order             Diet regular Room service appropriate? Yes; Fluid consistency: Thin  Diet effective now                     Antimicrobial agents: Anti-infectives (From admission, onward)    None        MEDICATIONS: Scheduled Meds:  insulin aspart  0-9 Units Subcutaneous TID WC   sodium chloride flush  3 mL Intravenous Q12H   Continuous Infusions:  heparin 1,500 Units/hr (11/21/21 1328)   PRN Meds:.acetaminophen **OR** acetaminophen, ondansetron **OR** ondansetron (ZOFRAN) IV, senna-docusate   I have personally reviewed following labs and imaging  studies  LABORATORY DATA: CBC: Recent Labs  Lab 11/18/21 1458 11/20/21 0246 11/21/21 0045 11/22/21 0152  WBC 8.0 7.9 7.1 7.0  NEUTROABS 5.4  --   --   --   HGB 14.6 12.8* 11.8* 12.3*  HCT 42.7 38.1* 35.0* 35.9*  MCV 89.3 89.4 88.8 90.2  PLT 205 157 162 159     Basic Metabolic Panel: Recent Labs  Lab 11/18/21 1458 11/19/21 0407 11/20/21 0246 11/22/21 0152  NA 137 130* 133* 136  K 4.4 4.2 4.0 4.0  CL 105 101 103 104  CO2 25 21* 22 22  GLUCOSE 172* 256* 195* 158*  BUN '19 17 23 23  '$ CREATININE 1.16 1.00 1.20 1.22  CALCIUM 9.1 8.4* 8.3* 8.5*     GFR: Estimated Creatinine Clearance: 74 mL/min (by C-G formula based on SCr of 1.22 mg/dL).  Liver Function Tests: No results for input(s): "AST", "ALT", "ALKPHOS", "BILITOT", "PROT", "ALBUMIN" in the last 168 hours. No results for input(s): "LIPASE", "AMYLASE" in the last 168 hours. No results for input(s): "AMMONIA" in the last 168 hours.  Coagulation Profile: No results for input(s): "INR", "PROTIME" in the last 168 hours.  Cardiac Enzymes: No results for input(s): "CKTOTAL", "CKMB", "CKMBINDEX", "TROPONINI" in the last 168 hours.  BNP (last 3 results) No results for input(s): "PROBNP" in the last 8760 hours.  Lipid Profile: No results for input(s): "CHOL", "HDL", "LDLCALC", "TRIG", "CHOLHDL", "LDLDIRECT" in the last 72 hours.  Thyroid Function Tests: No results for input(s): "TSH", "T4TOTAL", "FREET4", "T3FREE", "THYROIDAB" in the last 72 hours.  Anemia Panel: No results for input(s): "VITAMINB12", "FOLATE", "FERRITIN", "TIBC", "IRON", "RETICCTPCT" in the last 72 hours.  Urine analysis: No results found for: "COLORURINE", "APPEARANCEUR", "LABSPEC", "PHURINE", "GLUCOSEU", "HGBUR", "BILIRUBINUR", "KETONESUR", "PROTEINUR", "UROBILINOGEN", "NITRITE", "LEUKOCYTESUR"  Sepsis Labs: Lactic Acid, Venous No results found for: "LATICACIDVEN"  MICROBIOLOGY: No results found for this or any previous visit (from the past  240 hour(s)).  RADIOLOGY STUDIES/RESULTS: No results found.   LOS: 4 days   Oren Binet, MD  [facility]    To contact the attending provider between 7A-7P or the covering provider during after hours 7P-7A, please log into the web site [email or web address] and access using universal [place] password for that web site. If you do not have the password, please call the hospital operator.  11/22/2021, 10:10 AM

## 2021-11-22 NOTE — Progress Notes (Signed)
  Progress Note    11/22/2021 9:14 AM   Subjective: Right leg swelling continues to improve  Vitals:   11/22/21 0414 11/22/21 0746  BP: 124/73 137/72  Pulse: 62 (!) 55  Resp: 20 15  Temp: 97.6 F (36.4 C) 97.6 F (36.4 C)  SpO2: 97% 97%    Physical Exam: Awake alert oriented  Non-labored respirations Right leg with stable edema  CBC    Component Value Date/Time   WBC 7.0 11/22/2021 0152   RBC 3.98 (L) 11/22/2021 0152   HGB 12.3 (L) 11/22/2021 0152   HCT 35.9 (L) 11/22/2021 0152   PLT 159 11/22/2021 0152   MCV 90.2 11/22/2021 0152   MCH 30.9 11/22/2021 0152   MCHC 34.3 11/22/2021 0152   RDW 13.7 11/22/2021 0152   LYMPHSABS 2.0 11/18/2021 1458   MONOABS 0.5 11/18/2021 1458   EOSABS 0.1 11/18/2021 1458   BASOSABS 0.0 11/18/2021 1458    BMET    Component Value Date/Time   NA 136 11/22/2021 0152   K 4.0 11/22/2021 0152   CL 104 11/22/2021 0152   CO2 22 11/22/2021 0152   GLUCOSE 158 (H) 11/22/2021 0152   BUN 23 11/22/2021 0152   CREATININE 1.22 11/22/2021 0152   CALCIUM 8.5 (L) 11/22/2021 0152   GFRNONAA >60 11/22/2021 0152    INR No results found for: "INR"   Intake/Output Summary (Last 24 hours) at 11/22/2021 0914 Last data filed at 11/22/2021 0747 Gross per 24 hour  Intake 480 ml  Output 1450 ml  Net -970 ml     Assessment/plan:  66 y.o. male is here with occluded IVC filter.  Plan for thrombectomy from at least right lower extremity approach possibly left as well on Tuesday with Dr. Trula Slade in the Cath Lab.  Continue heparin drip will be n.p.o. past midnight on Monday.   Nicholas Lam C. Donzetta Matters, MD [facility] Office: [phone] Pager: [phone]  11/22/2021 9:14 AM

## 2021-11-22 NOTE — Progress Notes (Signed)
Pharmacy consult- follow-up Indication: DVT  Allergies  Allergen Reactions   Iodinated Contrast Media Hives, Rash and Swelling   Iodine     Patient Measurements: Height: '5\' 10"'$  (177.8 cm) Weight: 113.2 kg (249 lb 9 oz) IBW/kg (Calculated) : 73 Heparin Dosing Weight: 97 kg  Actual bodyweight 109 kg -patient reports weighing himself weekly   Vital Signs: Temp: 97.6 F (36.4 C) (06/11 0746) Temp Source: Oral (06/11 0746) BP: 137/72 (06/11 0746) Pulse Rate: 55 (06/11 0746)  Labs: Recent Labs    11/20/21 0246 11/21/21 0045 11/22/21 0152  HGB 12.8* 11.8* 12.3*  HCT 38.1* 35.0* 35.9*  PLT 157 162 159  HEPARINUNFRC 0.39 0.35 0.32  CREATININE 1.20  --  1.22     Estimated Creatinine Clearance: 74 mL/min (by C-G formula based on SCr of 1.22 mg/dL).  Assessment: Lenorris Karger a 66 y.o. male presented with 3 days of R leg swelling and warmth. 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 Pharmacy has been consulted for heparin dosing for VTE.    Anticoagulation PTA: Patient not on anticoagulation PTA.  Heparin level therapeutic at 0.32 on 1500 units/hr. Hgb stable high 11s to low 12s. Plts wnl. Plan for vascular intervention on Tuesday. No issues with bleeding per RN.   Goal of Therapy:  Heparin level 0.3-0.7 units/ml Monitor platelets by anticoagulation protocol: Yes   Plan:  Continue heparin gtt at 1500 units/hr Daily heparin level, CBC, s/s bleeding  Cathrine Muster, PharmD PGY2 Cardiology Pharmacy Resident Phone: [phone] 11/22/2021  7:49 AM Please check [email or web address] for unit-specific pharmacy phone numbers.

## 2021-11-23 ENCOUNTER — Other Ambulatory Visit (HOSPITAL_COMMUNITY): Payer: Self-pay

## 2021-11-23 DIAGNOSIS — R739 Hyperglycemia, unspecified: Secondary | ICD-10-CM | POA: Diagnosis not present

## 2021-11-23 DIAGNOSIS — I480 Paroxysmal atrial fibrillation: Secondary | ICD-10-CM | POA: Diagnosis not present

## 2021-11-23 DIAGNOSIS — E1169 Type 2 diabetes mellitus with other specified complication: Secondary | ICD-10-CM | POA: Diagnosis not present

## 2021-11-23 DIAGNOSIS — I82423 Acute embolism and thrombosis of iliac vein, bilateral: Secondary | ICD-10-CM | POA: Diagnosis not present

## 2021-11-23 LAB — CBC
HCT: 36.8 % — ABNORMAL LOW (ref 39.0–52.0)
Hemoglobin: 12.6 g/dL — ABNORMAL LOW (ref 13.0–17.0)
MCH: 30.5 pg (ref 26.0–34.0)
MCHC: 34.2 g/dL (ref 30.0–36.0)
MCV: 89.1 fL (ref 80.0–100.0)
Platelets: 165 10*3/uL (ref 150–400)
RBC: 4.13 MIL/uL — ABNORMAL LOW (ref 4.22–5.81)
RDW: 13.8 % (ref 11.5–15.5)
WBC: 6.8 10*3/uL (ref 4.0–10.5)
nRBC: 0 % (ref 0.0–0.2)

## 2021-11-23 LAB — GLUCOSE, CAPILLARY
Glucose-Capillary: 182 mg/dL — ABNORMAL HIGH (ref 70–99)
Glucose-Capillary: 259 mg/dL — ABNORMAL HIGH (ref 70–99)
Glucose-Capillary: 164 mg/dL — ABNORMAL HIGH (ref 70–99)
Glucose-Capillary: 189 mg/dL — ABNORMAL HIGH (ref 70–99)

## 2021-11-23 LAB — HEPARIN LEVEL (UNFRACTIONATED)
Heparin Unfractionated: 0.25 IU/mL — ABNORMAL LOW (ref 0.30–0.70)
Heparin Unfractionated: 0.28 IU/mL — ABNORMAL LOW (ref 0.30–0.70)

## 2021-11-23 NOTE — Care Management Important Message (Signed)
Important Message  Patient Details  Name: Nicholas Lam MRN: [id number] Date of Birth: 01-26-1956   Medicare Important Message Given:  Yes     Shelda Altes 11/23/2021, 7:59 AM

## 2021-11-23 NOTE — Progress Notes (Signed)
ANTICOAGULATION CONSULT NOTE  Pharmacy Consult:  Heparin Indication: DVT  Allergies  Allergen Reactions   Iodinated Contrast Media Hives, Rash and Swelling   Iodine     Patient Measurements: Height: '5\' 10"'$  (177.8 cm) Weight: 114.3 kg (251 lb 15.8 oz) IBW/kg (Calculated) : 73 Heparin Dosing Weight: 97 kg  Vital Signs: Temp: 98.2 F (36.8 C) (06/12 1614) Temp Source: Oral (06/12 1614) BP: 141/68 (06/12 1614) Pulse Rate: 67 (06/12 1614)  Labs: Recent Labs    11/21/21 0045 11/22/21 0152 11/23/21 0539 11/23/21 1544  HGB 11.8* 12.3* 12.6*  --   HCT 35.0* 35.9* 36.8*  --   PLT 162 159 165  --   HEPARINUNFRC 0.35 0.32 0.25* 0.28*  CREATININE  --  1.22  --   --     Estimated Creatinine Clearance: 74.4 mL/min (by C-G formula based on SCr of 1.22 mg/dL).  Assessment: Bob Daversa a 66 y.o. male presented with 3 days of R leg swelling and warmth. 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 Pharmacy has been consulted for heparin dosing for acute DVT.   Heparin level slightly sub-therapeutic at 0.28 units/mL.  No issue with heparin infusion nor bleeding per discussion with RN.  Goal of Therapy:  Heparin level 0.3-0.7 units/ml Monitor platelets by anticoagulation protocol: Yes   Plan:  Increase heparin gtt to 1850 units/hr Check 6 hr heparin level  Demarious Kapur D. Mina Marble, PharmD, BCPS, [place] 11/23/2021, 4:39 PM

## 2021-11-23 NOTE — Progress Notes (Addendum)
  Progress Note    11/23/2021 6:52 AM Hospital Day 4  Subjective:  no complaints  afebrile  Vitals:   11/22/21 2340 11/23/21 0411  BP: 127/76 125/68  Pulse: 62 (!) 58  Resp: 16 16  Temp: 98.3 F (36.8 C) 98.1 F (36.7 C)  SpO2: 97% 94%    Physical Exam: General:  no distress Lungs:  non labored Extremities:  swelling right leg about the same  CBC    Component Value Date/Time   WBC 7.0 11/22/2021 0152   RBC 3.98 (L) 11/22/2021 0152   HGB 12.3 (L) 11/22/2021 0152   HCT 35.9 (L) 11/22/2021 0152   PLT 159 11/22/2021 0152   MCV 90.2 11/22/2021 0152   MCH 30.9 11/22/2021 0152   MCHC 34.3 11/22/2021 0152   RDW 13.7 11/22/2021 0152   LYMPHSABS 2.0 11/18/2021 1458   MONOABS 0.5 11/18/2021 1458   EOSABS 0.1 11/18/2021 1458   BASOSABS 0.0 11/18/2021 1458    BMET    Component Value Date/Time   NA 136 11/22/2021 0152   K 4.0 11/22/2021 0152   CL 104 11/22/2021 0152   CO2 22 11/22/2021 0152   GLUCOSE 158 (H) 11/22/2021 0152   BUN 23 11/22/2021 0152   CREATININE 1.22 11/22/2021 0152   CALCIUM 8.5 (L) 11/22/2021 0152   GFRNONAA >60 11/22/2021 0152    INR No results found for: "INR"   Intake/Output Summary (Last 24 hours) at 11/23/2021 6720 Last data filed at 11/23/2021 0558 Gross per 24 hour  Intake 1919.68 ml  Output 1680 ml  Net 239.68 ml     Assessment/Plan:  66 y.o. male with occluded IVC filter Hospital Day 4  -plan for thrombectomy from  at least right lower extremity approach possibly left as well on Tuesday with Dr. Trula Slade in the Cath Lab -continue heparin -npo/consent/labs   Leontine Locket, PA-C [facility] [phone] 11/23/2021 6:52 AM   Spoke with patient about needing thrombectomy of the right leg and possibly the left via a popliteal approach.  Also discussed that he may require additional procedures that this is not completely successful.  We will try to leave his filter in place.  He will now sign consent  Annamarie Major

## 2021-11-23 NOTE — Progress Notes (Signed)
Physical Therapy Treatment Patient Details Name: Nicholas Lam MRN: [id number] DOB: 03/04/1956 Today's Date: 11/23/2021   History of Present Illness Pt is a 66 y.o. 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 Awaiting thrombectomy 11/24/21. PMH includes PAF, s/p IVC filter (2009), DM2.    PT Comments    Patient progressing slowly towards PT goals. Reports some back discomfort which is chronic. Session focused on progressive mobility and ambulation. Requires min guard assist for transfers and gait training using rollator but requires seated rest break due to fatigue and 3-4/4 DOE. Needs cues to stop talking and breathe as well as stay attended to task as pt is tangential throughout session. HR up to 145 bpm max with activity and Sp02 98% on RA. Encouraged increasing activity while in the hospital to improve overall endurance, strength and safe mobility. Will follow up post thrombectomy.    Recommendations for follow up therapy are one component of a multi-disciplinary discharge planning process, led by the attending physician.  Recommendations may be updated based on patient status, additional functional criteria and insurance authorization.  Follow Up Recommendations  Home health PT     Assistance Recommended at Discharge PRN  Patient can return home with the following A little help with bathing/dressing/bathroom;Assistance with cooking/housework   Equipment Recommendations  None recommended by PT    Recommendations for Other Services       Precautions / Restrictions Precautions Precautions: Fall Restrictions Weight Bearing Restrictions: No     Mobility  Bed Mobility Overal bed mobility: Modified Independent             General bed mobility comments: HOB slightly elevated, no use of rails. Increased time.    Transfers Overall transfer level: Needs assistance Equipment used: Rollator (4 wheels) Transfers: Sit to/from Stand, Bed to  chair/wheelchair/BSC Sit to Stand: Min guard, From elevated surface Stand pivot transfers: Min guard, From elevated surface         General transfer comment: Min guard for safety, Stood from EOB x2, transferred to chair post ambulation. Pt wanting to pull up on RW despite max cues for proper hand placement. Needs bed elevated.    Ambulation/Gait Ambulation/Gait assistance: Min guard Gait Distance (Feet): 100 Feet Assistive device: Rollator (4 wheels) Gait Pattern/deviations: Step-through pattern, Trunk flexed, Antalgic Gait velocity: Decreased     General Gait Details: slow, mildly unsteady gait with Min guard assist for balance, 1 seated rest break, needs cues to breathe and not talk, DOE 3-4/4, HR up to 145 bpm.   Stairs             Wheelchair Mobility    Modified Rankin (Stroke Patients Only)       Balance Overall balance assessment: Needs assistance Sitting-balance support: Feet supported, No upper extremity supported Sitting balance-Leahy Scale: Fair     Standing balance support: During functional activity Standing balance-Leahy Scale: Fair Standing balance comment: can static stand without UE support, preference for walker                            Cognition Arousal/Alertness: Awake/alert Behavior During Therapy: WFL for tasks assessed/performed Overall Cognitive Status: Within Functional Limits for tasks assessed                                 General Comments: WFL for simple tasks; attention limited by tangential conversation  requiring intermittent redirection to current topic/task; suspect baseline cognition        Exercises      General Comments General comments (skin integrity, edema, etc.): HR up to 145 bpm with ambulation, Sp02 >98% on RA. Bp pre activity 158/82, post activity BP 167/79.      Pertinent Vitals/Pain Pain Assessment Pain Assessment: Faces Faces Pain Scale: Hurts a little bit Pain Location:  back Pain Descriptors / Indicators: Discomfort, Sore Pain Intervention(s): Monitored during session, Repositioned    Home Living                          Prior Function            PT Goals (current goals can now be found in the care plan section) Progress towards PT goals: Progressing toward goals (slowly)    Frequency    Min 3X/week      PT Plan Current plan remains appropriate    Co-evaluation              AM-PAC PT "6 Clicks" Mobility   Outcome Measure  Help needed turning from your back to your side while in a flat bed without using bedrails?: None Help needed moving from lying on your back to sitting on the side of a flat bed without using bedrails?: None Help needed moving to and from a bed to a chair (including a wheelchair)?: A Little Help needed standing up from a chair using your arms (e.g., wheelchair or bedside chair)?: A Little Help needed to walk in hospital room?: A Little Help needed climbing 3-5 steps with a railing? : A Little 6 Click Score: 20    End of Session Equipment Utilized During Treatment: Gait belt Activity Tolerance: Patient limited by fatigue Patient left: in chair;with call bell/phone within reach Nurse Communication: Mobility status PT Visit Diagnosis: Other abnormalities of gait and mobility (R26.89)     Time: 1610-9604 PT Time Calculation (min) (ACUTE ONLY): 36 min  Charges:  $Gait Training: 8-22 mins $Therapeutic Exercise: 8-22 mins                     Marisa Severin, PT, DPT Acute Rehabilitation Services Secure chat preferred Office [place] 11/23/2021, 1:08 PM

## 2021-11-23 NOTE — Progress Notes (Signed)
PROGRESS NOTE        PATIENT DETAILS Name: Nicholas Lam Age: 66 y.o. Sex: male Date of Birth: 07-28-1955 Admit Date: 11/18/2021 Admitting Physician Lenore Cordia, MD TDS:[id number], [name]  Brief Summary: Patient is a 66 y.o.  male with history of PAF, DM-2, left renal stone-who apparently had a IVC filter placed in 2009 to prevent VTE given his sedentary lifestyle-presented with significant right lower extremity swelling-found to have extensive venous thrombosis up to the level of IVC.  Significant events: 6/7>> admit to [facility]-RLE swelling-extensive DVT.  Significant studies: 6/7>> RLE Doppler: Extensive DVT involving multiple veins. 6/8>> CT venogram: Extensive DVT involving IVC, common/internal/external iliac bilaterally and common femoral vein on the right.  Moderate to severe obstructive uropathy with 1.5 cm calculus at the UPJ.  Left adrenal adenoma.  Significant microbiology data:   Procedures:   Consults: Vascular surgery.  Subjective: RLE swelling appears essentially unchanged.  Objective: Vitals: Blood pressure 135/75, pulse 64, temperature 97.9 F (36.6 C), temperature source Oral, resp. rate 19, height '5\' 10"'$  (1.778 m), weight 114.3 kg, SpO2 94 %.   Exam: Gen Exam:Alert awake-not in any distress HEENT:atraumatic, normocephalic Chest: B/L clear to auscultation anteriorly CVS:S1S2 regular Abdomen:soft non tender, non distended Extremities:no edema Neurology: Non focal Skin: no rash   Pertinent Labs/Radiology:    Latest Ref Rng & Units 11/23/2021    5:39 AM 11/22/2021    1:52 AM 11/21/2021   12:45 AM  CBC  WBC 4.0 - 10.5 K/uL 6.8  7.0  7.1   Hemoglobin 13.0 - 17.0 g/dL 12.6  12.3  11.8   Hematocrit 39.0 - 52.0 % 36.8  35.9  35.0   Platelets 150 - 400 K/uL 165  159  162     Lab Results  Component Value Date   NA 136 11/22/2021   K 4.0 11/22/2021   CL 104 11/22/2021   CO2 22 11/22/2021       Assessment/Plan: Extensive RLE DVT-with left iliac vein DVT-and thrombosis extending up to the IVC: On IV heparin-vascular surgery following-thrombectomy planned on Tuesday.  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  If he develops symptoms/infection-we will need to reconsult urology.  PAF: Monitor on telemetry-on IV heparin.  DM-2 (A1c 8.2 on 6/8): Not a new diagnosis per patient-he previously was on metformin-but has been noncompliant-and does not take any medications at home.  Recent Labs    11/22/21 1555 11/22/21 2058 11/23/21 0606  GLUCAP 200* 190* 182*      Mechanical fall 2 weeks prior to this hospitalization: PT/OT eval  BMI: Estimated body mass index is 36.16 kg/m as calculated from the following:   Height as of this encounter: '5\' 10"'$  (1.778 m).   Weight as of this encounter: 114.3 kg.   Code status:   Code Status: Full Code   DVT Prophylaxis: IV heparin  Family Communication: None at bedside   Disposition Plan: Status is: Inpatient Remains inpatient appropriate because: Extensive VTE-thrombectomy planned next week-on IV heparin-thrombectomy planned for Tuesday.  Not stable for discharge.   Planned Discharge Destination:Home   Diet: Diet Order             Diet NPO time specified Except for: Sips with Meds  Diet effective midnight  Diet regular Room service appropriate? Yes; Fluid consistency: Thin  Diet effective now                     Antimicrobial agents: Anti-infectives (From admission, onward)    None        MEDICATIONS: Scheduled Meds:  insulin aspart  0-9 Units Subcutaneous TID WC   sodium chloride flush  3 mL Intravenous Q12H   Continuous Infusions:  heparin 1,650 Units/hr (11/23/21 0945)   PRN Meds:.acetaminophen **OR** acetaminophen, ondansetron **OR**  ondansetron (ZOFRAN) IV, senna-docusate   I have personally reviewed following labs and imaging studies  LABORATORY DATA: CBC: Recent Labs  Lab 11/18/21 1458 11/20/21 0246 11/21/21 0045 11/22/21 0152 11/23/21 0539  WBC 8.0 7.9 7.1 7.0 6.8  NEUTROABS 5.4  --   --   --   --   HGB 14.6 12.8* 11.8* 12.3* 12.6*  HCT 42.7 38.1* 35.0* 35.9* 36.8*  MCV 89.3 89.4 88.8 90.2 89.1  PLT 205 157 162 159 165     Basic Metabolic Panel: Recent Labs  Lab 11/18/21 1458 11/19/21 0407 11/20/21 0246 11/22/21 0152  NA 137 130* 133* 136  K 4.4 4.2 4.0 4.0  CL 105 101 103 104  CO2 25 21* 22 22  GLUCOSE 172* 256* 195* 158*  BUN '19 17 23 23  '$ CREATININE 1.16 1.00 1.20 1.22  CALCIUM 9.1 8.4* 8.3* 8.5*     GFR: Estimated Creatinine Clearance: 74.4 mL/min (by C-G formula based on SCr of 1.22 mg/dL).  Liver Function Tests: No results for input(s): "AST", "ALT", "ALKPHOS", "BILITOT", "PROT", "ALBUMIN" in the last 168 hours. No results for input(s): "LIPASE", "AMYLASE" in the last 168 hours. No results for input(s): "AMMONIA" in the last 168 hours.  Coagulation Profile: No results for input(s): "INR", "PROTIME" in the last 168 hours.  Cardiac Enzymes: No results for input(s): "CKTOTAL", "CKMB", "CKMBINDEX", "TROPONINI" in the last 168 hours.  BNP (last 3 results) No results for input(s): "PROBNP" in the last 8760 hours.  Lipid Profile: No results for input(s): "CHOL", "HDL", "LDLCALC", "TRIG", "CHOLHDL", "LDLDIRECT" in the last 72 hours.  Thyroid Function Tests: No results for input(s): "TSH", "T4TOTAL", "FREET4", "T3FREE", "THYROIDAB" in the last 72 hours.  Anemia Panel: No results for input(s): "VITAMINB12", "FOLATE", "FERRITIN", "TIBC", "IRON", "RETICCTPCT" in the last 72 hours.  Urine analysis: No results found for: "COLORURINE", "APPEARANCEUR", "LABSPEC", "PHURINE", "GLUCOSEU", "HGBUR", "BILIRUBINUR", "KETONESUR", "PROTEINUR", "UROBILINOGEN", "NITRITE", "LEUKOCYTESUR"  Sepsis  Labs: Lactic Acid, Venous No results found for: "LATICACIDVEN"  MICROBIOLOGY: No results found for this or any previous visit (from the past 240 hour(s)).  RADIOLOGY STUDIES/RESULTS: No results found.   LOS: 5 days   Oren Binet, MD  [facility]    To contact the attending provider between 7A-7P or the covering provider during after hours 7P-7A, please log into the web site [email or web address] and access using universal [place] password for that web site. If you do not have the password, please call the hospital operator.  11/23/2021, 10:10 AM

## 2021-11-23 NOTE — Progress Notes (Signed)
Pharmacy consult- follow-up Indication: DVT  Allergies  Allergen Reactions   Iodinated Contrast Media Hives, Rash and Swelling   Iodine     Patient Measurements: Height: '5\' 10"'$  (177.8 cm) Weight: 114.3 kg (251 lb 15.8 oz) IBW/kg (Calculated) : 73 Heparin Dosing Weight: 97 kg  Actual bodyweight 109 kg -patient reports weighing himself weekly   Vital Signs: Temp: 97.9 F (36.6 C) (06/12 0757) Temp Source: Oral (06/12 0757) BP: 135/75 (06/12 0757) Pulse Rate: 64 (06/12 0757)  Labs: Recent Labs    11/21/21 0045 11/22/21 0152 11/23/21 0539  HGB 11.8* 12.3* 12.6*  HCT 35.0* 35.9* 36.8*  PLT 162 159 165  HEPARINUNFRC 0.35 0.32 0.25*  CREATININE  --  1.22  --      Estimated Creatinine Clearance: 74.4 mL/min (by C-G formula based on SCr of 1.22 mg/dL).  Assessment: Nicholas Lam a 66 y.o. male presented with 3 days of R leg swelling and warmth. 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 Pharmacy has been consulted for heparin dosing for VTE.    Anticoagulation PTA: Patient not on anticoagulation PTA.  Heparin level 0.25 (on heparin 1500 units/hr) Plan for vascular intervention on Tuesday. No issues with bleeding per RN.   Goal of Therapy:  Heparin level 0.3-0.7 units/ml Monitor platelets by anticoagulation protocol: Yes   Plan:  Increase heparin gtt to 1650 units/hr Heparin level '@1600'$  Daily heparin level, CBC, s/s bleeding Plan for thrombectomy on Tuesday  Thank you for allowing pharmacy to be a part of this patient's care.  Donnald Garre, PharmD Clinical Pharmacist  Please check AMION for all [place] numbers After 10:00 PM, call [place] [phone]

## 2021-11-24 ENCOUNTER — Encounter (HOSPITAL_COMMUNITY)
Admission: EM | Disposition: A | Discharge status: Home Health | Payer: Self-pay | Source: Home / Self Care | Attending: Internal Medicine

## 2021-11-24 ENCOUNTER — Other Ambulatory Visit (HOSPITAL_COMMUNITY): Payer: Self-pay

## 2021-11-24 DIAGNOSIS — I82220 Acute embolism and thrombosis of inferior vena cava: Secondary | ICD-10-CM | POA: Diagnosis not present

## 2021-11-24 DIAGNOSIS — I82431 Acute embolism and thrombosis of right popliteal vein: Secondary | ICD-10-CM | POA: Diagnosis not present

## 2021-11-24 DIAGNOSIS — I480 Paroxysmal atrial fibrillation: Secondary | ICD-10-CM | POA: Diagnosis not present

## 2021-11-24 DIAGNOSIS — I82401 Acute embolism and thrombosis of unspecified deep veins of right lower extremity: Secondary | ICD-10-CM | POA: Diagnosis not present

## 2021-11-24 DIAGNOSIS — I82423 Acute embolism and thrombosis of iliac vein, bilateral: Secondary | ICD-10-CM | POA: Diagnosis not present

## 2021-11-24 DIAGNOSIS — E1169 Type 2 diabetes mellitus with other specified complication: Secondary | ICD-10-CM | POA: Diagnosis not present

## 2021-11-24 DIAGNOSIS — I82413 Acute embolism and thrombosis of femoral vein, bilateral: Secondary | ICD-10-CM | POA: Diagnosis not present

## 2021-11-24 DIAGNOSIS — R739 Hyperglycemia, unspecified: Secondary | ICD-10-CM | POA: Diagnosis not present

## 2021-11-24 HISTORY — PX: INTRAVASCULAR ULTRASOUND/IVUS: CATH118244

## 2021-11-24 HISTORY — PX: LOWER EXTREMITY VENOGRAPHY: CATH118253

## 2021-11-24 HISTORY — PX: PERIPHERAL VASCULAR BALLOON ANGIOPLASTY: CATH118281

## 2021-11-24 HISTORY — PX: PERIPHERAL VASCULAR THROMBECTOMY: CATH118306

## 2021-11-24 LAB — BASIC METABOLIC PANEL
Anion gap: 5 (ref 5–15)
BUN: 21 mg/dL (ref 8–23)
CO2: 24 mmol/L (ref 22–32)
Calcium: 8.6 mg/dL — ABNORMAL LOW (ref 8.9–10.3)
Chloride: 104 mmol/L (ref 98–111)
Creatinine, Ser: 1.12 mg/dL (ref 0.61–1.24)
GFR, Estimated: >60 mL/min (ref >60)
Glucose, Bld: 238 mg/dL — ABNORMAL HIGH (ref 70–99)
Potassium: 4.1 mmol/L (ref 3.5–5.1)
Sodium: 133 mmol/L — ABNORMAL LOW (ref 135–145)

## 2021-11-24 LAB — GLUCOSE, CAPILLARY
Glucose-Capillary: 175 mg/dL — ABNORMAL HIGH (ref 70–99)
Glucose-Capillary: 122 mg/dL — ABNORMAL HIGH (ref 70–99)
Glucose-Capillary: 239 mg/dL — ABNORMAL HIGH (ref 70–99)
Glucose-Capillary: 405 mg/dL — ABNORMAL HIGH (ref 70–99)
Glucose-Capillary: 382 mg/dL — ABNORMAL HIGH (ref 70–99)

## 2021-11-24 LAB — CBC
HCT: 36.6 % — ABNORMAL LOW (ref 39.0–52.0)
Hemoglobin: 12.6 g/dL — ABNORMAL LOW (ref 13.0–17.0)
MCH: 30.9 pg (ref 26.0–34.0)
MCHC: 34.4 g/dL (ref 30.0–36.0)
MCV: 89.7 fL (ref 80.0–100.0)
Platelets: 165 10*3/uL (ref 150–400)
RBC: 4.08 MIL/uL — ABNORMAL LOW (ref 4.22–5.81)
RDW: 14 % (ref 11.5–15.5)
WBC: 7.4 10*3/uL (ref 4.0–10.5)
nRBC: 0 % (ref 0.0–0.2)

## 2021-11-24 LAB — HEPARIN LEVEL (UNFRACTIONATED)
Heparin Unfractionated: 0.33 IU/mL (ref 0.30–0.70)
Heparin Unfractionated: 0.46 IU/mL (ref 0.30–0.70)

## 2021-11-24 LAB — POCT ACTIVATED CLOTTING TIME
Activated Clotting Time: 149 seconds
Activated Clotting Time: 311 seconds

## 2021-11-24 SURGERY — PERIPHERAL VASCULAR THROMBECTOMY
Anesthesia: LOCAL | Laterality: Bilateral

## 2021-11-24 MED ORDER — DIPHENHYDRAMINE HCL 50 MG/ML IJ SOLN
25.0000 mg | Freq: Once | INTRAMUSCULAR | Status: AC
  Administered 2021-11-24: 25 mg via INTRAVENOUS
  Filled 2021-11-24: qty 1

## 2021-11-24 MED ORDER — LIDOCAINE HCL (PF) 1 % IJ SOLN
INTRAMUSCULAR | Status: DC | PRN
  Administered 2021-11-24 (×2): 5 mL

## 2021-11-24 MED ORDER — HYDRALAZINE HCL 20 MG/ML IJ SOLN: 5.0000 mg | INTRAMUSCULAR | Status: DC | PRN

## 2021-11-24 MED ORDER — MIDAZOLAM HCL 2 MG/2ML IJ SOLN
INTRAMUSCULAR | Status: AC
  Filled 2021-11-24: qty 2

## 2021-11-24 MED ORDER — SODIUM CHLORIDE 0.9 % IV SOLN: INTRAVENOUS | Status: DC

## 2021-11-24 MED ORDER — SODIUM CHLORIDE 0.9% FLUSH: 3.0000 mL | INTRAVENOUS | Status: DC | PRN

## 2021-11-24 MED ORDER — SODIUM CHLORIDE 0.9% FLUSH
3.0000 mL | Freq: Two times a day (BID) | INTRAVENOUS | Status: DC
  Administered 2021-11-25 – 2021-11-26 (×4): 3 mL via INTRAVENOUS

## 2021-11-24 MED ORDER — INSULIN ASPART 100 UNIT/ML IJ SOLN
7.0000 [IU] | Freq: Once | INTRAMUSCULAR | Status: AC
  Administered 2021-11-24: 7 [IU] via SUBCUTANEOUS

## 2021-11-24 MED ORDER — HEPARIN (PORCINE) IN NACL 1000-0.9 UT/500ML-% IV SOLN
INTRAVENOUS | Status: DC | PRN
  Administered 2021-11-24: 500 mL

## 2021-11-24 MED ORDER — FENTANYL CITRATE (PF) 100 MCG/2ML IJ SOLN
INTRAMUSCULAR | Status: AC
  Filled 2021-11-24: qty 2

## 2021-11-24 MED ORDER — ACETAMINOPHEN 325 MG PO TABS: 650.0000 mg | ORAL_TABLET | ORAL | Status: DC | PRN

## 2021-11-24 MED ORDER — MIDAZOLAM HCL 2 MG/2ML IJ SOLN
INTRAMUSCULAR | Status: DC | PRN
  Administered 2021-11-24: 1 mg via INTRAVENOUS
  Administered 2021-11-24 (×2): 2 mg via INTRAVENOUS

## 2021-11-24 MED ORDER — SODIUM CHLORIDE 0.9 % WEIGHT BASED INFUSION: 1.0000 mL/kg/h | INTRAVENOUS | Status: AC

## 2021-11-24 MED ORDER — LABETALOL HCL 5 MG/ML IV SOLN: 10.0000 mg | INTRAVENOUS | Status: DC | PRN

## 2021-11-24 MED ORDER — METHYLPREDNISOLONE SODIUM SUCC 125 MG IJ SOLR
125.0000 mg | Freq: Once | INTRAMUSCULAR | Status: AC
  Administered 2021-11-24: 125 mg via INTRAVENOUS
  Filled 2021-11-24: qty 2

## 2021-11-24 MED ORDER — FENTANYL CITRATE (PF) 100 MCG/2ML IJ SOLN
INTRAMUSCULAR | Status: DC | PRN
  Administered 2021-11-24: 50 ug via INTRAVENOUS
  Administered 2021-11-24: 25 ug via INTRAVENOUS
  Administered 2021-11-24: 50 ug via INTRAVENOUS

## 2021-11-24 MED ORDER — HEPARIN SODIUM (PORCINE) 1000 UNIT/ML IJ SOLN
INTRAMUSCULAR | Status: DC | PRN
  Administered 2021-11-24: 10000 [IU] via INTRAVENOUS

## 2021-11-24 MED ORDER — IODIXANOL 320 MG/ML IV SOLN
INTRAVENOUS | Status: DC | PRN
  Administered 2021-11-24: 40 mL

## 2021-11-24 MED ORDER — SODIUM CHLORIDE 0.9 % IV SOLN
250.0000 mL | INTRAVENOUS | Status: DC | PRN
Start: 2021-11-25 — End: 2021-11-26

## 2021-11-24 MED ORDER — ONDANSETRON HCL 4 MG/2ML IJ SOLN
4.0000 mg | Freq: Four times a day (QID) | INTRAMUSCULAR | Status: DC | PRN
Start: 2021-11-24 — End: 2021-11-24

## 2021-11-24 SURGICAL SUPPLY — 22 items
BALLN MUSTANG 12X60X135 (BALLOONS) ×2
BALLN MUSTANG 12X80X75 (BALLOONS)
BALLOON MUSTANG 12X60X135 (BALLOONS) IMPLANT
BALLOON MUSTANG 12X80X75 (BALLOONS) IMPLANT
CANISTER PENUMBRA ENGINE (MISCELLANEOUS) ×1 IMPLANT
CATH ANGIO 5F BER2 100CM (CATHETERS) ×1 IMPLANT
CATH ANGIO 5F SIM1 100CM (CATHETERS) ×1 IMPLANT
CATH CROSS OVER TEMPO 5F (CATHETERS) ×1 IMPLANT
CATH HEADHUNTER 5F 125CM (CATHETERS) ×1 IMPLANT
CATH INFINITI 5 FR IM (CATHETERS) ×1 IMPLANT
CATH LITNG FLASH HTORQ 100BERN (CATHETERS) ×1 IMPLANT
CATH VISIONS PV .035 IVUS (CATHETERS) ×1 IMPLANT
DEVICE ONE SNARE 10MM (MISCELLANEOUS) ×1 IMPLANT
DRYSEAL FLEXSHEATH 16FR 33CM (SHEATH) ×1
GLIDEWIRE ADV .035X260CM (WIRE) ×2 IMPLANT
KIT ENCORE 26 ADVANTAGE (KITS) ×1 IMPLANT
KIT MICROPUNCTURE NIT STIFF (SHEATH) ×1 IMPLANT
SHEATH DRYSEAL FLEX 16FR 33CM (SHEATH) IMPLANT
SHEATH PINNACLE 8F 10CM (SHEATH) ×1 IMPLANT
SHEATH PROBE COVER 6X72 (BAG) ×1 IMPLANT
TRAY PV CATH (CUSTOM PROCEDURE TRAY) ×1 IMPLANT
WIRE BENTSON .035X145CM (WIRE) ×1 IMPLANT

## 2021-11-24 NOTE — Progress Notes (Signed)
ANTICOAGULATION CONSULT NOTE - Follow Up Consult  Pharmacy Consult for heparin Indication: DVT  Labs: Recent Labs    11/22/21 0152 11/23/21 0539 11/23/21 1544 11/23/21 2345  HGB 12.3* 12.6*  --   --   HCT 35.9* 36.8*  --   --   PLT 159 165  --   --   HEPARINUNFRC 0.32 0.25* 0.28* 0.46  CREATININE 1.22  --   --   --     Assessment/Plan:  66yo male therapeutic on heparin after rate change. Will continue infusion at current rate of 1850 units/hr and confirm stable with am labs.   Wynona Neat, PharmD, BCPS  11/24/2021,12:50 AM

## 2021-11-24 NOTE — Progress Notes (Signed)
ANTICOAGULATION CONSULT NOTE  Pharmacy Consult:  Heparin Indication: DVT  Allergies  Allergen Reactions   Iodinated Contrast Media Hives, Rash and Swelling   Iodine     Patient Measurements: Height: '5\' 10"'$  (177.8 cm) Weight: 115.7 kg (255 lb 1.2 oz) IBW/kg (Calculated) : 73 Heparin Dosing Weight: 97 kg  Vital Signs: Temp: 97.8 F (36.6 C) (06/13 0753) Temp Source: Oral (06/13 0753) BP: 120/70 (06/13 0753) Pulse Rate: 59 (06/13 0753)  Labs: Recent Labs    11/22/21 0152 11/23/21 0539 11/23/21 1544 11/23/21 2345 11/24/21 0033  HGB 12.3* 12.6*  --  12.6*  --   HCT 35.9* 36.8*  --  36.6*  --   PLT 159 165  --  165  --   HEPARINUNFRC 0.32 0.25* 0.28* 0.46 0.33  CREATININE 1.22  --   --  1.12  --      Estimated Creatinine Clearance: 81.6 mL/min (by C-G formula based on SCr of 1.12 mg/dL).  Assessment: Nicholas Lam a 66 y.o. male presented with 3 days of R leg swelling and warmth. 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 Pharmacy has been consulted for heparin dosing for acute DVT.   Heparin level at goal this AM at 0.33.  No overt bleeding or complications noted, CBC stable  Goal of Therapy:  Heparin level 0.3-0.7 units/ml Monitor platelets by anticoagulation protocol: Yes   Plan:  Continue IV heparin gtt at 1850 units/hr Daily heparin level and CBC. F/u plans for oral anticoagulation after procedure tomorrow.  Of note, no prescription drug coverage found in Epic.  [place] Crane, Roylene Reason, BCCP Clinical Pharmacist  11/24/2021 9:16 AM   [facility] pharmacy phone numbers are listed on [email or web address]

## 2021-11-24 NOTE — Progress Notes (Signed)
Patient voiced his concerns/frustrations about his treatment for his CBGs. Patient's HS CBG 405. Notified Dr. Alcario Drought and 7 units ordered.   Please note that patient was placed on regular diet previously and has since been changed to carb mod diet after 405 result by Dr. Alcario Drought.   CBG rechecked 382. Patient did not want me to address that result. Patient stated that "he did not want his CBG checked while he was sleeping. CBG will be checked around 6am if he is not sleeping. Otherwise it can be checked when he wakes up.   Will continue to monitor

## 2021-11-24 NOTE — Progress Notes (Signed)
  Transition of Care [facility]) Screening Note   Patient Details  Name: Nicholas Lam Date of Birth: 10-Oct-1955   Transition of [facility]) CM/SW Contact:    Dawayne Patricia, RN Phone Number: 11/24/2021, 2:27 PM    Transition of Care Department [facility]) has reviewed patient and note thrombectomy planned for today. Per PT/OT evals recommendations for [facility] f/u. We will continue to monitor patient advancement through interdisciplinary progression rounds post procedure. If new patient transition needs arise, please place a TOC consult.

## 2021-11-24 NOTE — Op Note (Signed)
Patient name: Nicholas Lam MRN: [id number] DOB: 05/12/1956 Sex: male  11/24/2021 Pre-operative Diagnosis: DVT Post-operative diagnosis:  Same Surgeon:  Annamarie Major Procedure Performed:  1.  Ultrasound-guided access, left popliteal vein  2.  Ultrasound-guided access, right popliteal vein  3.  Intravascular ultrasound (IVUS): Inferior vena cava, bilateral common iliac vein, bilateral external iliac vein, bilateral common femoral vein, right femoral vein, right popliteal vein  4.  Mechanical thrombectomy:  Inferior vena cava, bilateral common iliac vein, bilateral external iliac vein, bilateral common femoral vein, right femoral vein, right popliteal vein  5.  Balloon venoplasty:  bilateral common iliac vein, bilateral external iliac vein, bilateral common femoral vein, right femoral vein, right popliteal vein  6.  Ilio caval venogram, right leg venogram  7.  Conscious sedation, 103 minutes   Indications: This is a 66 year old gentleman with history of IVC filter who comes in with severe right leg swelling.  Preoperative imaging included a CT venogram as well as right leg ultrasound.  He did not want his filter removed.  Procedure:  The patient was identified in the holding area and taken to room 8.  The patient was then placed supine on the table and prepped and draped in the usual sterile fashion.  A time out was called.  Conscious sedation was administered with the use of IV fentanyl and Versed under continuous physician and nurse monitoring.  Heart rate, blood pressure, and oxygen saturation were continuously monitored.  Total sedation time was 103 minutes.  Ultrasound was used to evaluate the right popliteal vein.  There was thrombus present and it was dilated.  It was not compressible.  It was cannulated under ultrasound guidance with a micropuncture needle.  A [place] wire was advanced followed by micropuncture sheath.  I then injected contrast to confirm that I was in the vein.  A  Glidewire advantage was then advanced into the right subclavian vein and an 8 French sheath was placed.  I then evaluated the left popliteal vein with ultrasound.  This vein was dilated but compressible.  It was cannulated under ultrasound guidance with a micropuncture needle.  An 018 wire was inserted followed by placement of micropuncture sheath.  Next a Bentson wire was inserted and a 8 [place] sheath was placed.  The patient was then fully heparinized.  I then used intravascular ultrasound to evaluate the right popliteal, femoral, common femoral, external iliac and common iliac veins as well as the inferior vena cava.  This showed thrombus throughout.  Next, a Gore dry seal 16 French sheath was placed.  I then inserted the CAT 16 venous mechanical thrombectomy device.  I performed mechanical thrombectomy of the right popliteal, femoral, common femoral, external iliac, and common iliac veins as well as atria vena cava.  Multiple passes were made evacuating a significant amount of thrombus.  IVUS was used multiple times to ensure that most of the thrombus was evacuated.  There was no obvious stenosis.  I performed venography which showed some residual thrombus particularly in the vena cava filter.  I then performed balloon venoplasty of the right popliteal, femoral, common femoral, external iliac, and common iliac veins.  Now had established a flow channel.  Attention was then turned towards the left iliac venous system.  I did not want to upsized the sheath in the left popliteal vein and so I snared the wire from the right leg via a 10 French snare and the vena cava from the left.  I brought the wire out  through the sheath in the left popliteal vein for through and through access.  I then reinserted the CAT 16 device and performed mechanical thrombectomy of the left common iliac, external iliac, and common femoral vein.  Balloon venoplasty was then performed with a 12 mm balloon of the left common iliac,  external iliac, and common femoral vein.  I reevaluated this with ultrasound and saw a residual thrombus within the iliac system and so an additional pass with the CAT 16 device was made.  Venography was then performed of the iliac system which now showed inline flow through the left iliac veins into the IVC.  There appeared to be residual thrombus within the vena cava filter and so I again repeated mechanical thrombectomy using the CAT 16 device of the filter.  Multiple additional injections were performed there was still some retained chronic thrombus within the filter but there was a good flow channel through this.  At this point, we had lost approximately 1 L of blood and I did not feel proceeding would be contagious and so I elected to terminate the procedure.  The sheaths were removed and 3-0 nylon bolster sutures were used to close the venography sites.  Sterile dressings were applied followed by compression wrap.  The patient tolerated the procedure well there were no immediate complications   Impression:  #1  Successful percutaneous mechanical thrombectomy using the CAT 16 device the inferior vena cava, bilateral common iliac vein, bilateral external iliac veins, bilateral common femoral vein, the right femoral and right popliteal veins  #2  Balloon venoplasty was performed using a 12 mm balloon  #3  Massive amounts of thrombus were evacuated.  There is still a small amount of residual chronic thrombus within the filter.  #4  If the patient continues to have difficulty with leg swelling, I would consider removing the filter with the angio [facility] device as protection.  Hopefully with the decrease in thrombus burden, he will be significantly better and will not require additional treatment  #5  The patient will continue with anticoagulation.  He has compression wraps on his legs now and will be transition to compression stockings tomorrow once the sutures have been removed from bilateral popliteal  veins   V. Annamarie Major, M.D., [facility] [place] Office: [phone] Pager:  [phone]

## 2021-11-24 NOTE — Progress Notes (Signed)
Patient arrived from cath lab to 4E01, patient with B legs wrapped from thigh to toes. No bleeding through the dressings at this time. Vital signs obtained and patient on monitor. Call bell with in reach. Maleeah Crossman, Bettina Gavia RN

## 2021-11-24 NOTE — Progress Notes (Signed)
Mobility Specialist: Progress Note   11/24/21 1124  Mobility  Activity Ambulated with assistance in hallway  Level of Assistance Minimal assist, patient does 75% or more  Assistive Device Four wheel walker  Distance Ambulated (ft) 170 ft (85'x2)  Activity Response Tolerated well  $Mobility charge 1 Mobility   During Mobility: 116 HR Post-Mobility: 77 HR, 98% SpO2  Pt received in the bed and agreeable to ambulation. Mod I with bed mobility and minA to stand. Stopped x1 for seated break secondary to SOB, otherwise no c/o. Pt set up at the sink after session per NT request.   Harrell Gave Aadyn Buchheit Mobility Specialist Mobility Specialist [facility] [place]: [phone]

## 2021-11-24 NOTE — Progress Notes (Signed)
PROGRESS NOTE        PATIENT DETAILS Name: Nicholas Lam Age: 66 y.o. Sex: male Date of Birth: 1955/11/03 Admit Date: 11/18/2021 Admitting Physician Lenore Cordia, MD NFA:[id number], [name]  Brief Summary: Patient is a 66 y.o.  male with history of PAF, DM-2, left renal stone-who apparently had a IVC filter placed in 2009 to prevent VTE given his sedentary lifestyle-presented with significant right lower extremity swelling-found to have extensive venous thrombosis up to the level of IVC.  Significant events: 6/7>> admit to TRH-RLE swelling-extensive DVT.  Significant studies: 6/7>> RLE Doppler: Extensive DVT involving multiple veins. 6/8>> CT venogram: Extensive DVT involving IVC, common/internal/external iliac bilaterally and common femoral vein on the right.  Moderate to severe obstructive uropathy with 1.5 cm calculus at the UPJ.  Left adrenal adenoma.  Significant microbiology data:   Procedures:   Consults: Vascular surgery.  Subjective: RLE swelling appears slightly better.  No chest pain or shortness of breath.  Objective: Vitals: Blood pressure (!) 148/89, pulse 72, temperature (!) 97.5 F (36.4 C), temperature source Oral, resp. rate 20, height '5\' 10"'$  (1.778 m), weight 115.7 kg, SpO2 98 %.   Exam: Gen Exam:Alert awake-not in any distress HEENT:atraumatic, normocephalic Chest: B/L clear to auscultation anteriorly CVS:S1S2 regular Abdomen:soft non tender, non distended Extremities: RLE swelling present-slightly decreased. Neurology: Non focal Skin: no rash   Pertinent Labs/Radiology:    Latest Ref Rng & Units 11/23/2021   11:45 PM 11/23/2021    5:39 AM 11/22/2021    1:52 AM  CBC  WBC 4.0 - 10.5 K/uL 7.4  6.8  7.0   Hemoglobin 13.0 - 17.0 g/dL 12.6  12.6  12.3   Hematocrit 39.0 - 52.0 % 36.6  36.8  35.9   Platelets 150 - 400 K/uL 165  165  159     Lab Results  Component Value Date   NA 133 (L) 11/23/2021   K 4.1  11/23/2021   CL 104 11/23/2021   CO2 24 11/23/2021      Assessment/Plan: Extensive RLE DVT-with left iliac vein DVT-and thrombosis extending up to the IVC: On IV heparin-vascular surgery following-thrombectomy planned on 6/13.  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  If he develops symptoms/infection-we will need to reconsult urology.  PAF: Monitor on telemetry-on IV heparin.  DM-2 (A1c 8.2 on 6/8): Not a new diagnosis per patient-he previously was on metformin-but has been noncompliant-and does not take any medications at home.  Recent Labs    11/23/21 2104 11/24/21 0640 11/24/21 1119  GLUCAP 189* 175* 122*      Mechanical fall 2 weeks prior to this hospitalization: PT/OT eval  BMI: Estimated body mass index is 36.6 kg/m as calculated from the following:   Height as of this encounter: '5\' 10"'$  (1.778 m).   Weight as of this encounter: 115.7 kg.   Code status:   Code Status: Full Code   DVT Prophylaxis: IV heparin  Family Communication: None at bedside   Disposition Plan: Status is: Inpatient Remains inpatient appropriate because: Extensive VTE-thrombectomy planned next week-on IV heparin-thrombectomy planned for today.  Not stable for discharge.   Planned Discharge Destination:Home likely on 6/14 or 6/15 depending on how he does postoperatively.   Diet: Diet Order  Diet NPO time specified Except for: Sips with Meds  Diet effective midnight                     Antimicrobial agents: Anti-infectives (From admission, onward)    None        MEDICATIONS: Scheduled Meds:  insulin aspart  0-9 Units Subcutaneous TID WC   sodium chloride flush  3 mL Intravenous Q12H   Continuous Infusions:  sodium chloride 100 mL/hr at 11/24/21 0801   heparin 1,850 Units/hr (11/24/21 0801)   PRN  Meds:.acetaminophen **OR** acetaminophen, ondansetron **OR** ondansetron (ZOFRAN) IV, senna-docusate   I have personally reviewed following labs and imaging studies  LABORATORY DATA: CBC: Recent Labs  Lab 11/18/21 1458 11/20/21 0246 11/21/21 0045 11/22/21 0152 11/23/21 0539 11/23/21 2345  WBC 8.0 7.9 7.1 7.0 6.8 7.4  NEUTROABS 5.4  --   --   --   --   --   HGB 14.6 12.8* 11.8* 12.3* 12.6* 12.6*  HCT 42.7 38.1* 35.0* 35.9* 36.8* 36.6*  MCV 89.3 89.4 88.8 90.2 89.1 89.7  PLT 205 157 162 159 165 165     Basic Metabolic Panel: Recent Labs  Lab 11/18/21 1458 11/19/21 0407 11/20/21 0246 11/22/21 0152 11/23/21 2345  NA 137 130* 133* 136 133*  K 4.4 4.2 4.0 4.0 4.1  CL 105 101 103 104 104  CO2 25 21* '22 22 24  '$ GLUCOSE 172* 256* 195* 158* 238*  BUN '19 17 23 23 21  '$ CREATININE 1.16 1.00 1.20 1.22 1.12  CALCIUM 9.1 8.4* 8.3* 8.5* 8.6*     GFR: Estimated Creatinine Clearance: 81.6 mL/min (by C-G formula based on SCr of 1.12 mg/dL).  Liver Function Tests: No results for input(s): "AST", "ALT", "ALKPHOS", "BILITOT", "PROT", "ALBUMIN" in the last 168 hours. No results for input(s): "LIPASE", "AMYLASE" in the last 168 hours. No results for input(s): "AMMONIA" in the last 168 hours.  Coagulation Profile: No results for input(s): "INR", "PROTIME" in the last 168 hours.  Cardiac Enzymes: No results for input(s): "CKTOTAL", "CKMB", "CKMBINDEX", "TROPONINI" in the last 168 hours.  BNP (last 3 results) No results for input(s): "PROBNP" in the last 8760 hours.  Lipid Profile: No results for input(s): "CHOL", "HDL", "LDLCALC", "TRIG", "CHOLHDL", "LDLDIRECT" in the last 72 hours.  Thyroid Function Tests: No results for input(s): "TSH", "T4TOTAL", "FREET4", "T3FREE", "THYROIDAB" in the last 72 hours.  Anemia Panel: No results for input(s): "VITAMINB12", "FOLATE", "FERRITIN", "TIBC", "IRON", "RETICCTPCT" in the last 72 hours.  Urine analysis: No results found for:  "COLORURINE", "APPEARANCEUR", "LABSPEC", "PHURINE", "GLUCOSEU", "HGBUR", "BILIRUBINUR", "KETONESUR", "PROTEINUR", "UROBILINOGEN", "NITRITE", "LEUKOCYTESUR"  Sepsis Labs: Lactic Acid, Venous No results found for: "LATICACIDVEN"  MICROBIOLOGY: No results found for this or any previous visit (from the past 240 hour(s)).  RADIOLOGY STUDIES/RESULTS: No results found.   LOS: 6 days   Oren Binet, MD  [facility]    To contact the attending provider between 7A-7P or the covering provider during after hours 7P-7A, please log into the web site [email or web address] and access using universal [place] password for that web site. If you do not have the password, please call the hospital operator.  11/24/2021, 12:26 PM

## 2021-11-25 ENCOUNTER — Encounter (HOSPITAL_COMMUNITY): Payer: Self-pay | Admitting: Surgery

## 2021-11-25 ENCOUNTER — Other Ambulatory Visit (HOSPITAL_COMMUNITY): Payer: Self-pay

## 2021-11-25 DIAGNOSIS — I480 Paroxysmal atrial fibrillation: Secondary | ICD-10-CM | POA: Diagnosis not present

## 2021-11-25 DIAGNOSIS — E1169 Type 2 diabetes mellitus with other specified complication: Secondary | ICD-10-CM | POA: Diagnosis not present

## 2021-11-25 DIAGNOSIS — I82423 Acute embolism and thrombosis of iliac vein, bilateral: Secondary | ICD-10-CM | POA: Diagnosis not present

## 2021-11-25 LAB — LIPID PANEL
Cholesterol: 177 mg/dL (ref 0–200)
HDL: 43 mg/dL (ref >40)
LDL Cholesterol: 114 mg/dL — ABNORMAL HIGH (ref 0–99)
Total CHOL/HDL Ratio: 4.1 RATIO
Triglycerides: 102 mg/dL (ref <150)
VLDL: 20 mg/dL (ref 0–40)

## 2021-11-25 LAB — BASIC METABOLIC PANEL
Anion gap: 7 (ref 5–15)
BUN: 21 mg/dL (ref 8–23)
CO2: 19 mmol/L — ABNORMAL LOW (ref 22–32)
Calcium: 8.1 mg/dL — ABNORMAL LOW (ref 8.9–10.3)
Chloride: 105 mmol/L (ref 98–111)
Creatinine, Ser: 1.4 mg/dL — ABNORMAL HIGH (ref 0.61–1.24)
GFR, Estimated: 55 mL/min — ABNORMAL LOW (ref >60)
Glucose, Bld: 374 mg/dL — ABNORMAL HIGH (ref 70–99)
Potassium: 4.8 mmol/L (ref 3.5–5.1)
Sodium: 131 mmol/L — ABNORMAL LOW (ref 135–145)

## 2021-11-25 LAB — CBC
HCT: 33.3 % — ABNORMAL LOW (ref 39.0–52.0)
Hemoglobin: 11.3 g/dL — ABNORMAL LOW (ref 13.0–17.0)
MCH: 30.4 pg (ref 26.0–34.0)
MCHC: 33.9 g/dL (ref 30.0–36.0)
MCV: 89.5 fL (ref 80.0–100.0)
Platelets: 169 10*3/uL (ref 150–400)
RBC: 3.72 MIL/uL — ABNORMAL LOW (ref 4.22–5.81)
RDW: 13.9 % (ref 11.5–15.5)
WBC: 10 10*3/uL (ref 4.0–10.5)
nRBC: 0 % (ref 0.0–0.2)

## 2021-11-25 LAB — GLUCOSE, CAPILLARY
Glucose-Capillary: 185 mg/dL — ABNORMAL HIGH (ref 70–99)
Glucose-Capillary: 239 mg/dL — ABNORMAL HIGH (ref 70–99)
Glucose-Capillary: 243 mg/dL — ABNORMAL HIGH (ref 70–99)
Glucose-Capillary: 248 mg/dL — ABNORMAL HIGH (ref 70–99)

## 2021-11-25 LAB — HEPARIN LEVEL (UNFRACTIONATED)
Heparin Unfractionated: 0.88 IU/mL — ABNORMAL HIGH (ref 0.30–0.70)
Heparin Unfractionated: 0.77 IU/mL — ABNORMAL HIGH (ref 0.30–0.70)

## 2021-11-25 MED ORDER — HEPARIN (PORCINE) 25000 UT/250ML-% IV SOLN
1800.0000 [IU]/h | INTRAVENOUS | Status: DC
Start: 2021-11-25 — End: 2021-11-25

## 2021-11-25 MED ORDER — APIXABAN 5 MG PO TABS
10.0000 mg | ORAL_TABLET | Freq: Two times a day (BID) | ORAL | Status: DC
  Filled 2021-11-25: qty 2

## 2021-11-25 MED ORDER — APIXABAN 5 MG PO TABS: 5.0000 mg | ORAL_TABLET | Freq: Two times a day (BID) | ORAL | Status: DC

## 2021-11-25 MED ORDER — APIXABAN 5 MG PO TABS
10.0000 mg | ORAL_TABLET | Freq: Two times a day (BID) | ORAL | Status: DC
  Administered 2021-11-25 – 2021-11-26 (×3): 10 mg via ORAL
  Filled 2021-11-25 (×2): qty 2

## 2021-11-25 MED ORDER — APIXABAN (ELIQUIS) VTE STARTER PACK (10MG AND 5MG)
ORAL_TABLET | ORAL | 3 refills | Status: DC
  Filled 2021-11-25: qty 74, 30d supply, fill #0

## 2021-11-25 MED ORDER — SODIUM CHLORIDE 0.9 % IV SOLN: INTRAVENOUS | Status: AC

## 2021-11-25 NOTE — Progress Notes (Signed)
OT Cancellation Note  Patient Details Name: Nicholas Lam MRN: [id number] DOB: 1955-12-09   Cancelled Treatment:    Reason Eval/Treat Not Completed: Other (comment)working with PT.  Will continue efforts as schedule allows.  Tinaya Ceballos D Elayah Klooster 11/25/2021, 3:33 PM

## 2021-11-25 NOTE — TOC Initial Note (Signed)
Transition of Care [facility]) - Initial/Assessment Note    Patient Details  Name: Nicholas Lam MRN: [id number] Date of Birth: Aug 10, 1955  Transition of [facility]) CM/SW Contact:    Pollie Friar, RN Phone Number: 11/25/2021, 2:22 PM  Clinical Narrative:                 Patient is from home alone. He has a caregiver that comes 2 days a week to assist with home needs. She is able to provide transportation to appointments on these days.  He also has started with [employer] and is figuring out what they can assist with.  Due to a recent fall he has started a program called [employer]. He checks in with them 3 times a day and if he doesn't they call him and then his emergency contacts to check in on him.  PCP appointment on the AVS.  Patient has groceries delivered to the home per [place].  Pt feels his caregiver will be able to pick up prescriptions as needed. Pt doesn't have prescription drug coverage and doesn't want to get enrolled. He states he can afford the medications prescribed.  Orders for home health services. Pt selected [facility] as his ex-spouse had used the company in the past. Information on the AVS.  Pts caregiver is able to provide transport home tomorrow.    Expected Discharge Plan: [place] Barriers to Discharge: Continued Medical Work up   Patient Goals and CMS Choice   CMS Medicare.gov Compare Post Acute Care list provided to:: Patient Choice offered to / list presented to : Patient  Expected Discharge Plan and Services Expected Discharge Plan: [place]   Discharge Planning Services: CM Consult Post Acute Care Choice: [place] arrangements for the past 2 months: Apartment                           HH Arranged: PT, OT HH Agency: [place] Date [facility] Agency Contacted: 11/25/21   Representative spoke with at [place]: Tommi Rumps  Prior Living Arrangements/Services Living arrangements for the past 2  months: Apartment Lives with:: Self Patient language and need for interpreter reviewed:: Yes Do you feel safe going back to the place where you live?: Yes        Care giver support system in place?: No (comment) Current home services: DME (cane/ rollator/ shower seat) Criminal Activity/Legal Involvement Pertinent to Current Situation/Hospitalization: No - Comment as needed  Activities of Daily Living Home Assistive Devices/Equipment: Hearing aid ADL Screening (condition at time of admission) Patient's cognitive ability adequate to safely complete daily activities?: Yes Is the patient deaf or have difficulty hearing?: Yes Does the patient have difficulty seeing, even when wearing glasses/contacts?: Yes Does the patient have difficulty concentrating, remembering, or making decisions?: No Patient able to express need for assistance with ADLs?: Yes Does the patient have difficulty dressing or bathing?: No Independently performs ADLs?: Yes (appropriate for developmental age) Does the patient have difficulty walking or climbing stairs?: Yes Weakness of Legs: Both Weakness of Arms/Hands: Both  Permission Sought/Granted                  Emotional Assessment Appearance:: Appears stated age Attitude/Demeanor/Rapport: Engaged Affect (typically observed): Accepting Orientation: : Oriented to Self, Oriented to Place, Oriented to  Time, Oriented to Situation   Psych Involvement: No (comment)  Admission diagnosis:  Acute deep vein thrombosis (DVT) of  proximal vein of both lower extremities (HCC) [I82.4Y3] Acute bilateral deep vein thrombosis (DVT) of iliac veins of lower extremities (HCC) [I82.423] Patient Active Problem List   Diagnosis Date Noted   Acute bilateral deep vein thrombosis (DVT) of iliac veins of lower extremities (HCC) 11/18/2021   Paroxysmal atrial fibrillation ([place]) 11/18/2021   Hyperglycemia 11/18/2021   Pain due to onychomycosis of toenails of both feet 02/05/2020    Porokeratosis 02/05/2020   Amblyopia of left eye 04/16/2016   Hypertropia of right eye 04/16/2016   PVD (posterior vitreous detachment), bilateral 04/16/2016   Ureteral stone 01/14/2016   Hydronephrosis, left 12/31/2015   UTI (urinary tract infection) 12/31/2015   Metatarsalgia 11/23/2015   Abnormal CT scan, esophagus 10/17/2015   Abdominal wall mass 10/16/2015   Dizziness 09/28/2015   Adenoma of left adrenal gland 09/04/2015   Hepatic cyst 09/04/2015   Idiopathic hypotension 09/04/2015   Diabetes mellitus ([place]) 09/04/2015   Ventral hernia without obstruction or gangrene 09/04/2015   Left retinal detachment 08/29/2015   Localized edema 08/29/2015   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 [place] [address] Phone: [phone] Fax: [phone]     Social Determinants of Health (SDOH) Interventions    Readmission Risk Interventions     No data to display

## 2021-11-25 NOTE — Discharge Instructions (Signed)
Information on my medicine - ELIQUIS (apixaban)  This medication education was reviewed with me or my healthcare representative as part of my discharge preparation.  Why was Eliquis prescribed for you? Eliquis was prescribed to treat blood clots that may have been found in the veins of your legs (deep vein thrombosis) or in your lungs (pulmonary embolism) and to reduce the risk of them occurring again.  What do You need to know about Eliquis ? The starting dose is 10 mg (two 5 mg tablets) taken TWICE daily for the FIRST SEVEN (7) DAYS, then on (enter date)  12/02/2021  the dose is reduced to ONE 5 mg tablet taken TWICE daily.  Eliquis may be taken with or without food.   Try to take the dose about the same time in the morning and in the evening. If you have difficulty swallowing the tablet whole please discuss with your pharmacist how to take the medication safely.  Take Eliquis exactly as prescribed and DO NOT stop taking Eliquis without talking to the doctor who prescribed the medication.  Stopping may increase your risk of developing a new blood clot.  Refill your prescription before you run out.  After discharge, you should have regular check-up appointments with your healthcare provider that is prescribing your Eliquis.    What do you do if you miss a dose? If a dose of ELIQUIS is not taken at the scheduled time, take it as soon as possible on the same day and twice-daily administration should be resumed. The dose should not be doubled to make up for a missed dose.  Important Safety Information A possible side effect of Eliquis is bleeding. You should call your healthcare provider right away if you experience any of the following: Bleeding from an injury or your nose that does not stop. Unusual colored urine (red or dark brown) or unusual colored stools (red or black). Unusual bruising for unknown reasons. A serious fall or if you hit your head (even if there is no  bleeding).  Some medicines may interact with Eliquis and might increase your risk of bleeding or clotting while on Eliquis. To help avoid this, consult your healthcare provider or pharmacist prior to using any new prescription or non-prescription medications, including herbals, vitamins, non-steroidal anti-inflammatory drugs (NSAIDs) and supplements.  This website has more information on Eliquis (apixaban): http://[email or web address].com/eliquis/home

## 2021-11-25 NOTE — Progress Notes (Addendum)
[place] for Heparin to Apixabn Indication:  VTE treatment  Allergies  Allergen Reactions   Iodinated Contrast Media Hives, Rash and Swelling   Iodine     Patient Measurements: Height: '5\' 10"'$  (177.8 cm) Weight: 113.7 kg (250 lb 10.6 oz) IBW/kg (Calculated) : 73  Heparin Dosing Weight: 97 kg  Vital Signs: Temp: 98.5 F (36.9 C) (06/14 0321) Temp Source: Oral (06/14 0321) BP: 129/72 (06/14 0321) Pulse Rate: 91 (06/14 0321)  Labs: Recent Labs    11/23/21 0539 11/23/21 1544 11/23/21 2345 11/24/21 0033 11/25/21 0202  HGB 12.6*  --  12.6*  --  11.3*  HCT 36.8*  --  36.6*  --  33.3*  PLT 165  --  165  --  169  HEPARINUNFRC 0.25*   < > 0.46 0.33 0.88*  CREATININE  --   --  1.12  --  1.40*   < > = values in this interval not displayed.    Estimated Creatinine Clearance: 64.7 mL/min (A) (by C-G formula based on SCr of 1.4 mg/dL (H)).   Assessment: 66 y.o. male presented with 3 days of R leg swelling and warmth. 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 Pharmacy has been consulted to transition patient from heparin infusion to apixaban for acute DVT.   CBC stable, platelets 169. No signs bleeding reported. Apixaban education provided to patient along with options for affordability including patient assistance programs. Patient verbalized understanding.    Goal of Therapy:  Heparin level 0.3-0.7 units/ml Monitor platelets by anticoagulation protocol: Yes   Plan:  Stop heparin infusion at 10:00 Start apixaban '10mg'$  BID x7 days followed by apixaban '5mg'$  BID thereafter, first dose today at 10:00  Continue to monitor H&H and platelets   Addendum:  Per discussion from RN, patient wanting to delay transition from heparin infusion to PO anticoagulation until able to discuss with family RN to notify MD, will continue heparin infusion at current rate and transition to PO when able   Thank you for allowing pharmacy to be a  part of this patient's care.  Ardyth Harps, PharmD Clinical Pharmacist

## 2021-11-25 NOTE — Progress Notes (Addendum)
  Progress Note    11/25/2021 8:04 AM 1 Day Post-Op  Subjective:  says his O2 sats are better and his back pain is better  afebrile  Vitals:   11/24/21 2329 11/25/21 0321  BP: (!) 141/78 129/72  Pulse: 98 91  Resp: 13 20  Temp: 98.6 F (37 C) 98.5 F (36.9 C)  SpO2:  96%    Physical Exam: General:  non distress Lungs:  non labored Extremities:  bilateral legs wrapped with coban   CBC    Component Value Date/Time   WBC 10.0 11/25/2021 0202   RBC 3.72 (L) 11/25/2021 0202   HGB 11.3 (L) 11/25/2021 0202   HCT 33.3 (L) 11/25/2021 0202   PLT 169 11/25/2021 0202   MCV 89.5 11/25/2021 0202   MCH 30.4 11/25/2021 0202   MCHC 33.9 11/25/2021 0202   RDW 13.9 11/25/2021 0202   LYMPHSABS 2.0 11/18/2021 1458   MONOABS 0.5 11/18/2021 1458   EOSABS 0.1 11/18/2021 1458   BASOSABS 0.0 11/18/2021 1458    BMET    Component Value Date/Time   NA 131 (L) 11/25/2021 0202   K 4.8 11/25/2021 0202   CL 105 11/25/2021 0202   CO2 19 (L) 11/25/2021 0202   GLUCOSE 374 (H) 11/25/2021 0202   BUN 21 11/25/2021 0202   CREATININE 1.40 (H) 11/25/2021 0202   CALCIUM 8.1 (L) 11/25/2021 0202   GFRNONAA 55 (L) 11/25/2021 0202    INR No results found for: "INR"   Intake/Output Summary (Last 24 hours) at 11/25/2021 0804 Last data filed at 11/25/2021 0330 Gross per 24 hour  Intake 1524.63 ml  Output 3050 ml  Net -1525.37 ml     Assessment/Plan:  66 y.o. male is s/p:  1.  Ultrasound-guided access, left popliteal vein 2.  Ultrasound-guided access, right popliteal vein 3.  Intravascular ultrasound (IVUS): Inferior vena cava, bilateral common iliac vein, bilateral external iliac vein, bilateral common femoral vein, right femoral vein, right popliteal vein 4.  Mechanical thrombectomy:  Inferior vena cava, bilateral common iliac vein, bilateral external iliac vein, bilateral common femoral vein, right femoral vein, right popliteal vein 5.  Balloon venoplasty:  bilateral common iliac vein,  bilateral external iliac vein, bilateral common femoral vein, right femoral vein, right popliteal vein 6.  Ilio caval venogram, right leg venogram 7.  Conscious sedation, 103 minutes 1 Day Post-Op   -pt doing well this am.  Hgb stable -mild increase in creatinine to 1.4-may need gentle hydration today-will defer to primary team -DVT prophylaxis:  heparin gtt-okay to transition to po anticoagulation -pt will need thigh high compression socks as bolsters in popliteal fossas have been removed.  -f/u in our office in 6 weeks with studies.    Leontine Locket, PA-C [facility] [phone] 11/25/2021 8:04 AM  I agree with the above.  Have seen and evaluated the patient.  He is postoperative day 1 status post mechanical venous thrombectomy.  He is now in compression socks.  He states his legs feel better.  He will be transition to a DOAC.  He can be discharged from my perspective and follow-up in the office in 4 to 6 weeks  [place]

## 2021-11-25 NOTE — Progress Notes (Signed)
ANTICOAGULATION CONSULT NOTE  Pharmacy Consult:  Heparin Indication: DVT  Allergies  Allergen Reactions   Iodinated Contrast Media Hives, Rash and Swelling   Iodine     Patient Measurements: Height: '5\' 10"'$  (177.8 cm) Weight: 113.7 kg (250 lb 10.6 oz) IBW/kg (Calculated) : 73 Heparin Dosing Weight: 97 kg  Vital Signs: Temp: 98.5 F (36.9 C) (06/14 0321) Temp Source: Oral (06/14 0321) BP: 129/72 (06/14 0321) Pulse Rate: 91 (06/14 0321)  Labs: Recent Labs    11/23/21 0539 11/23/21 1544 11/23/21 2345 11/24/21 0033 11/25/21 0202  HGB 12.6*  --  12.6*  --  11.3*  HCT 36.8*  --  36.6*  --  33.3*  PLT 165  --  165  --  169  HEPARINUNFRC 0.25*   < > 0.46 0.33 0.88*  CREATININE  --   --  1.12  --   --    < > = values in this interval not displayed.    Estimated Creatinine Clearance: 80.8 mL/min (by C-G formula based on SCr of 1.12 mg/dL).  Assessment: Nicholas Lam a 66 y.o. male presented with 3 days of R leg swelling and warmth. 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 Pharmacy has been consulted for heparin dosing for acute DVT.   Heparin level above goal 0.88 after being previously therapeutic x2; No overt bleeding or complications with infusion reported by RN, CBC stable; Patient did receive 10,000 units during thrombectomy yesterday evening. Given two previously therapeutic levels on this rate, will reduce slightly and recheck level later this morning.   Goal of Therapy:  Heparin level 0.3-0.7 units/ml Monitor platelets by anticoagulation protocol: Yes   Plan:  Decrease IV heparin gtt to 1800 units/hr Daily heparin level and CBC. F/u plans for oral anticoagulation after procedure tomorrow.  Of note, no prescription drug coverage found in Epic.  Georga Bora, PharmD Clinical Pharmacist 11/25/2021 3:49 AM Please check AMION for all [place] numbers\

## 2021-11-25 NOTE — Progress Notes (Signed)
PHARMACIST LIPID MONITORING   Nicholas Lam is a 66 y.o. 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  Pharmacy has been consulted to optimize lipid-lowering therapy with the indication of secondary prevention for clinical ASCVD.  Recent Labs:  Lipid Panel (last 6 months):   Lab Results  Component Value Date   CHOL 177 11/25/2021   TRIG 102 11/25/2021   HDL 43 11/25/2021   CHOLHDL 4.1 11/25/2021   VLDL 20 11/25/2021   LDLCALC 114 (H) 11/25/2021    Hepatic function panel (last 6 months):   No results found for: "AST", "ALT", "ALKPHOS", "BILITOT", "BILIDIR", "IBILI"  SCr (since admission):   Serum creatinine: 1.4 mg/dL (H) 11/25/21 0202 Estimated creatinine clearance: 64.7 mL/min (A)  Current therapy and lipid therapy tolerance Current lipid-lowering therapy: none Previous lipid-lowering therapies (if applicable): none Documented or reported allergies or intolerances to lipid-lowering therapies (if applicable): none  Assessment:   Patient prefers no changes in lipid-lowering therapy at this time due to personal preference .  Plan:   1.Statin intensity (high intensity recommended for all patients regardless of the LDL):  No statin changes per patient preference/request  2.Add ezetimibe (if any one of the following):   Not indicated at this time.  3.Refer to lipid clinic:   No  4.Follow-up with:  Primary care provider - Patient, [name]  5.Follow-up labs after discharge:  No changes in lipid therapy, repeat a lipid panel in one year.      Thank you for allowing pharmacy to be a part of this patient's care.  Ardyth Harps, PharmD Clinical Pharmacist

## 2021-11-25 NOTE — Progress Notes (Addendum)
PROGRESS NOTE        PATIENT DETAILS Name: Nicholas Lam Age: 66 y.o. Sex: male Date of Birth: 1956-02-17 Admit Date: 11/18/2021 Admitting Physician Lenore Cordia, MD EHU:[id number], [name]  Brief Summary: Patient is a 66 y.o.  male with history of PAF, DM-2, left renal stone-who apparently had a IVC filter placed in 2009 to prevent VTE given his sedentary lifestyle-presented with significant right lower extremity swelling-found to have extensive venous thrombosis up to the level of IVC.  Significant events: 6/7>> admit to [facility]-RLE swelling-extensive DVT-IV heparin 6/13>> thrombectomy  Significant studies: 6/7>> RLE Doppler: Extensive DVT involving multiple veins. 6/8>> CT venogram: Extensive DVT involving IVC, common/internal/external iliac bilaterally and common femoral vein on the right.  Moderate to severe obstructive uropathy with 1.5 cm calculus at the UPJ.  Left adrenal adenoma.  Significant microbiology data:   Procedures: 6/13>> mechanical thrombectomy by vascular surgery  Consults: Vascular surgery.  Subjective: No major issues-both legs wrapped.  Does not want to be started on any medications for his diabetes.  Agreeable to be started on Eliquis-agreeable to get medications from TOC.  Objective: Vitals: Blood pressure 129/72, pulse 91, temperature 98.5 F (36.9 C), temperature source Oral, resp. rate 20, height '5\' 10"'$  (1.778 m), weight 113.7 kg, SpO2 96 %.   Exam: Gen Exam:Alert awake-not in any distress HEENT:atraumatic, normocephalic Chest: B/L clear to auscultation anteriorly CVS:S1S2 regular Abdomen:soft non tender, non distended Extremities: Both legs are wrapped Neurology: Non focal Skin: no rash   Pertinent Labs/Radiology:    Latest Ref Rng & Units 11/25/2021    2:02 AM 11/23/2021   11:45 PM 11/23/2021    5:39 AM  CBC  WBC 4.0 - 10.5 K/uL 10.0  7.4  6.8   Hemoglobin 13.0 - 17.0 g/dL 11.3  12.6  12.6    Hematocrit 39.0 - 52.0 % 33.3  36.6  36.8   Platelets 150 - 400 K/uL 169  165  165     Lab Results  Component Value Date   NA 131 (L) 11/25/2021   K 4.8 11/25/2021   CL 105 11/25/2021   CO2 19 (L) 11/25/2021      Assessment/Plan: Extensive RLE DVT-with left iliac vein DVT-and thrombosis extending up to the IVC: S/p thrombectomy on 6/13-discussed with vascular surgery-okay to transition to Eliquis.  Mobilize today-monitor closely-possible discharge on 6/13 if he continues to improve.    AKI: Mild-likely due to hemodynamic mediated injury-in the setting of thrombectomy and mild blood loss-gently hydrate with IVF today.  Recheck electrolytes tomorrow.  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  If he develops symptoms/infection-we will need to reconsult urology.  PAF: Monitor on telemetry-on anticoagulation.  DM-2 (A1c 8.2 on 6/8): Not a new diagnosis per patient-he previously was on metformin-but has been noncompliant-and does not take any medications at home.  Discussed at length-he does not want to be placed on any medications for his diabetes.  Per patient-he has been refusing SQ insulin in the hospital occasionally.  Recent Labs    11/24/21 2146 11/24/21 2326 11/25/21 0615  GLUCAP 405* 382* 239*      Mechanical fall 2 weeks prior to this hospitalization: PT/OT eval -Home health recommended.  BMI: Estimated body mass index is 35.97 kg/m as  calculated from the following:   Height as of this encounter: '5\' 10"'$  (1.778 m).   Weight as of this encounter: 113.7 kg.   Code status:   Code Status: Full Code   DVT Prophylaxis: IV heparin  Family Communication: None at bedside   Disposition Plan: Status is: Inpatient Remains inpatient appropriate because: Extensive VTE-thrombectomy on 6/13-switching to oral anticoagulation  today-ambulate-if stable discharge on 6/15.   Planned Discharge Destination:Home likely on 6/15   Diet: Diet Order             Diet Carb Modified Fluid consistency: Thin; Room service appropriate? Yes  Diet effective now                     Antimicrobial agents: Anti-infectives (From admission, onward)    None        MEDICATIONS: Scheduled Meds:  insulin aspart  0-9 Units Subcutaneous TID WC   sodium chloride flush  3 mL Intravenous Q12H   sodium chloride flush  3 mL Intravenous Q12H   Continuous Infusions:  sodium chloride 100 mL/hr at 11/24/21 1429   sodium chloride     sodium chloride     heparin 1,800 Units/hr (11/25/21 0640)   PRN Meds:.sodium chloride, acetaminophen **OR** acetaminophen, hydrALAZINE, labetalol, ondansetron **OR** ondansetron (ZOFRAN) IV, senna-docusate, sodium chloride flush   I have personally reviewed following labs and imaging studies  LABORATORY DATA: CBC: Recent Labs  Lab 11/18/21 1458 11/20/21 0246 11/21/21 0045 11/22/21 0152 11/23/21 0539 11/23/21 2345 11/25/21 0202  WBC 8.0   < > 7.1 7.0 6.8 7.4 10.0  NEUTROABS 5.4  --   --   --   --   --   --   HGB 14.6   < > 11.8* 12.3* 12.6* 12.6* 11.3*  HCT 42.7   < > 35.0* 35.9* 36.8* 36.6* 33.3*  MCV 89.3   < > 88.8 90.2 89.1 89.7 89.5  PLT 205   < > 162 159 165 165 169   < > = values in this interval not displayed.     Basic Metabolic Panel: Recent Labs  Lab 11/19/21 0407 11/20/21 0246 11/22/21 0152 11/23/21 2345 11/25/21 0202  NA 130* 133* 136 133* 131*  K 4.2 4.0 4.0 4.1 4.8  CL 101 103 104 104 105  CO2 21* '22 22 24 '$ 19*  GLUCOSE 256* 195* 158* 238* 374*  BUN '17 23 23 21 21  '$ CREATININE 1.00 1.20 1.22 1.12 1.40*  CALCIUM 8.4* 8.3* 8.5* 8.6* 8.1*     GFR: Estimated Creatinine Clearance: 64.7 mL/min (A) (by C-G formula based on SCr of 1.4 mg/dL (H)).  Liver Function Tests: No results for input(s): "AST", "ALT", "ALKPHOS", "BILITOT", "PROT", "ALBUMIN" in the  last 168 hours. No results for input(s): "LIPASE", "AMYLASE" in the last 168 hours. No results for input(s): "AMMONIA" in the last 168 hours.  Coagulation Profile: No results for input(s): "INR", "PROTIME" in the last 168 hours.  Cardiac Enzymes: No results for input(s): "CKTOTAL", "CKMB", "CKMBINDEX", "TROPONINI" in the last 168 hours.  BNP (last 3 results) No results for input(s): "PROBNP" in the last 8760 hours.  Lipid Profile: Recent Labs    11/25/21 0202  CHOL 177  HDL 43  LDLCALC 114*  TRIG 102  CHOLHDL 4.1    Thyroid Function Tests: No results for input(s): "TSH", "T4TOTAL", "FREET4", "T3FREE", "THYROIDAB" in the last 72 hours.  Anemia Panel: No results for input(s): "VITAMINB12", "FOLATE", "FERRITIN", "TIBC", "IRON", "RETICCTPCT" in the last 72 hours.  Urine analysis: No results found for: "COLORURINE", "APPEARANCEUR", "LABSPEC", "PHURINE", "GLUCOSEU", "HGBUR", "BILIRUBINUR", "KETONESUR", "PROTEINUR", "UROBILINOGEN", "NITRITE", "LEUKOCYTESUR"  Sepsis Labs: Lactic Acid, Venous No results found for: "LATICACIDVEN"  MICROBIOLOGY: No results found for this or any previous visit (from the past 240 hour(s)).  RADIOLOGY STUDIES/RESULTS: PERIPHERAL VASCULAR CATHETERIZATION  Result Date: 11/24/2021 Images from the original result were not included. Patient name: Mack Alvidrez MRN: [id number] DOB: 1955-07-25 Sex: male 11/24/2021 Pre-operative Diagnosis: DVT Post-operative diagnosis:  Same Surgeon:  Annamarie Major Procedure Performed:  1.  Ultrasound-guided access, left popliteal vein  2.  Ultrasound-guided access, right popliteal vein  3.  Intravascular ultrasound (IVUS): Inferior vena cava, bilateral common iliac vein, bilateral external iliac vein, bilateral common femoral vein, right femoral vein, right popliteal vein  4.  Mechanical thrombectomy:  Inferior vena cava, bilateral common iliac vein, bilateral external iliac vein, bilateral common femoral vein, right femoral  vein, right popliteal vein  5.  Balloon venoplasty:  bilateral common iliac vein, bilateral external iliac vein, bilateral common femoral vein, right femoral vein, right popliteal vein  6.  Ilio caval venogram, right leg venogram  7.  Conscious sedation, 103 minutes Indications: This is a 66 year old gentleman with history of IVC filter who comes in with severe right leg swelling.  Preoperative imaging included a CT venogram as well as right leg ultrasound.  He did not want his filter removed. Procedure:  The patient was identified in the holding area and taken to room 8.  The patient was then placed supine on the table and prepped and draped in the usual sterile fashion.  A time out was called.  Conscious sedation was administered with the use of IV fentanyl and Versed under continuous physician and nurse monitoring.  Heart rate, blood pressure, and oxygen saturation were continuously monitored.  Total sedation time was 103 minutes.  Ultrasound was used to evaluate the right popliteal vein.  There was thrombus present and it was dilated.  It was not compressible.  It was cannulated under ultrasound guidance with a micropuncture needle.  A [place] wire was advanced followed by micropuncture sheath.  I then injected contrast to confirm that I was in the vein.  A Glidewire advantage was then advanced into the right subclavian vein and an 8 French sheath was placed.  I then evaluated the left popliteal vein with ultrasound.  This vein was dilated but compressible.  It was cannulated under ultrasound guidance with a micropuncture needle.  An 018 wire was inserted followed by placement of micropuncture sheath.  Next a Bentson wire was inserted and a 8 [place] sheath was placed.  The patient was then fully heparinized.  I then used intravascular ultrasound to evaluate the right popliteal, femoral, common femoral, external iliac and common iliac veins as well as the inferior vena cava.  This showed thrombus throughout.   Next, a Gore dry seal 16 French sheath was placed.  I then inserted the CAT 16 venous mechanical thrombectomy device.  I performed mechanical thrombectomy of the right popliteal, femoral, common femoral, external iliac, and common iliac veins as well as atria vena cava.  Multiple passes were made evacuating a significant amount of thrombus.  IVUS was used multiple times to ensure that most of the thrombus was evacuated.  There was no obvious stenosis.  I performed venography which showed some residual thrombus particularly in the vena cava filter.  I then performed balloon venoplasty of the right popliteal, femoral, common femoral, external iliac, and common iliac veins.  Now  had established a flow channel. Attention was then turned towards the left iliac venous system.  I did not want to upsized the sheath in the left popliteal vein and so I snared the wire from the right leg via a 10 French snare and the vena cava from the left.  I brought the wire out through the sheath in the left popliteal vein for through and through access.  I then reinserted the CAT 16 device and performed mechanical thrombectomy of the left common iliac, external iliac, and common femoral vein.  Balloon venoplasty was then performed with a 12 mm balloon of the left common iliac, external iliac, and common femoral vein.  I reevaluated this with ultrasound and saw a residual thrombus within the iliac system and so an additional pass with the CAT 16 device was made.  Venography was then performed of the iliac system which now showed inline flow through the left iliac veins into the IVC.  There appeared to be residual thrombus within the vena cava filter and so I again repeated mechanical thrombectomy using the CAT 16 device of the filter.  Multiple additional injections were performed there was still some retained chronic thrombus within the filter but there was a good flow channel through this.  At this point, we had lost approximately 1 L of  blood and I did not feel proceeding would be contagious and so I elected to terminate the procedure.  The sheaths were removed and 3-0 nylon bolster sutures were used to close the venography sites.  Sterile dressings were applied followed by compression wrap.  The patient tolerated the procedure well there were no immediate complications Impression:  #1  Successful percutaneous mechanical thrombectomy using the CAT 16 device the inferior vena cava, bilateral common iliac vein, bilateral external iliac veins, bilateral common femoral vein, the right femoral and right popliteal veins  #2  Balloon venoplasty was performed using a 12 mm balloon  #3  Massive amounts of thrombus were evacuated.  There is still a small amount of residual chronic thrombus within the filter.  #4  If the patient continues to have difficulty with leg swelling, I would consider removing the filter with the angio [facility] device as protection.  Hopefully with the decrease in thrombus burden, he will be significantly better and will not require additional treatment  #5  The patient will continue with anticoagulation.  He has compression wraps on his legs now and will be transition to compression stockings tomorrow once the sutures have been removed from bilateral popliteal veins V. Annamarie Major, M.D., [facility] [place] Office: [phone] Pager:  [phone]   CARDIAC CATHETERIZATION  Result Date: 11/24/2021 Images from the original result were not included. Patient name: Welford Christmas MRN: [id number] DOB: 23-Jan-1956 Sex: male 11/24/2021 Pre-operative Diagnosis: DVT Post-operative diagnosis:  Same Surgeon:  Annamarie Major Procedure Performed:  1.  Ultrasound-guided access, left popliteal vein  2.  Ultrasound-guided access, right popliteal vein  3.  Intravascular ultrasound (IVUS): Inferior vena cava, bilateral common iliac vein, bilateral external iliac vein, bilateral common femoral vein, right femoral vein, right  popliteal vein  4.  Mechanical thrombectomy:  Inferior vena cava, bilateral common iliac vein, bilateral external iliac vein, bilateral common femoral vein, right femoral vein, right popliteal vein  5.  Balloon venoplasty:  bilateral common iliac vein, bilateral external iliac vein, bilateral common femoral vein, right femoral vein, right popliteal vein  6.  Ilio caval venogram, right leg venogram  7.  Conscious  sedation, 103 minutes Indications: This is a 66 year old gentleman with history of IVC filter who comes in with severe right leg swelling.  Preoperative imaging included a CT venogram as well as right leg ultrasound.  He did not want his filter removed. Procedure:  The patient was identified in the holding area and taken to room 8.  The patient was then placed supine on the table and prepped and draped in the usual sterile fashion.  A time out was called.  Conscious sedation was administered with the use of IV fentanyl and Versed under continuous physician and nurse monitoring.  Heart rate, blood pressure, and oxygen saturation were continuously monitored.  Total sedation time was 103 minutes.  Ultrasound was used to evaluate the right popliteal vein.  There was thrombus present and it was dilated.  It was not compressible.  It was cannulated under ultrasound guidance with a micropuncture needle.  A [place] wire was advanced followed by micropuncture sheath.  I then injected contrast to confirm that I was in the vein.  A Glidewire advantage was then advanced into the right subclavian vein and an 8 French sheath was placed.  I then evaluated the left popliteal vein with ultrasound.  This vein was dilated but compressible.  It was cannulated under ultrasound guidance with a micropuncture needle.  An 018 wire was inserted followed by placement of micropuncture sheath.  Next a Bentson wire was inserted and a 8 [place] sheath was placed.  The patient was then fully heparinized.  I then used intravascular ultrasound  to evaluate the right popliteal, femoral, common femoral, external iliac and common iliac veins as well as the inferior vena cava.  This showed thrombus throughout.  Next, a Gore dry seal 16 French sheath was placed.  I then inserted the CAT 16 venous mechanical thrombectomy device.  I performed mechanical thrombectomy of the right popliteal, femoral, common femoral, external iliac, and common iliac veins as well as atria vena cava.  Multiple passes were made evacuating a significant amount of thrombus.  IVUS was used multiple times to ensure that most of the thrombus was evacuated.  There was no obvious stenosis.  I performed venography which showed some residual thrombus particularly in the vena cava filter.  I then performed balloon venoplasty of the right popliteal, femoral, common femoral, external iliac, and common iliac veins.  Now had established a flow channel. Attention was then turned towards the left iliac venous system.  I did not want to upsized the sheath in the left popliteal vein and so I snared the wire from the right leg via a 10 French snare and the vena cava from the left.  I brought the wire out through the sheath in the left popliteal vein for through and through access.  I then reinserted the CAT 16 device and performed mechanical thrombectomy of the left common iliac, external iliac, and common femoral vein.  Balloon venoplasty was then performed with a 12 mm balloon of the left common iliac, external iliac, and common femoral vein.  I reevaluated this with ultrasound and saw a residual thrombus within the iliac system and so an additional pass with the CAT 16 device was made.  Venography was then performed of the iliac system which now showed inline flow through the left iliac veins into the IVC.  There appeared to be residual thrombus within the vena cava filter and so I again repeated mechanical thrombectomy using the CAT 16 device of the filter.  Multiple additional injections  were  performed there was still some retained chronic thrombus within the filter but there was a good flow channel through this.  At this point, we had lost approximately 1 L of blood and I did not feel proceeding would be contagious and so I elected to terminate the procedure.  The sheaths were removed and 3-0 nylon bolster sutures were used to close the venography sites.  Sterile dressings were applied followed by compression wrap.  The patient tolerated the procedure well there were no immediate complications Impression:  #1  Successful percutaneous mechanical thrombectomy using the CAT 16 device the inferior vena cava, bilateral common iliac vein, bilateral external iliac veins, bilateral common femoral vein, the right femoral and right popliteal veins  #2  Balloon venoplasty was performed using a 12 mm balloon  #3  Massive amounts of thrombus were evacuated.  There is still a small amount of residual chronic thrombus within the filter.  #4  If the patient continues to have difficulty with leg swelling, I would consider removing the filter with the angio [facility] device as protection.  Hopefully with the decrease in thrombus burden, he will be significantly better and will not require additional treatment  #5  The patient will continue with anticoagulation.  He has compression wraps on his legs now and will be transition to compression stockings tomorrow once the sutures have been removed from bilateral popliteal veins V. Annamarie Major, M.D., [facility] [place] Office: [phone] Pager:  [phone]     LOS: 7 days   Oren Binet, MD  [facility]    To contact the attending provider between 7A-7P or the covering provider during after hours 7P-7A, please log into the web site [email or web address] and access using universal [place] password for that web site. If you do not have the password, please call the hospital operator.  11/25/2021, 8:44 AM

## 2021-11-25 NOTE — Plan of Care (Signed)
  Problem: Skin Integrity: Goal: Risk for impaired skin integrity will decrease Outcome: Progressing   Problem: Tissue Perfusion: Goal: Adequacy of tissue perfusion will improve Outcome: Progressing   

## 2021-11-25 NOTE — Progress Notes (Signed)
Physical Therapy Treatment Patient Details Name: Nicholas Lam MRN: [id number] DOB: 1956/02/09 Today's Date: 11/25/2021   History of Present Illness Pt is a 66 y.o. 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 S/p mechanical thrombectomy, balloon venoplasty, ilio caval venogram R leg venogram 6/13. PMH includes PAF, s/p IVC filter (2009), DM2.    PT Comments    Pt continues to display increased WOB with standing activity, needing cues to pause or cease talking to allow him to recover. His HR would increase up to 147 bpm with activity. Focused session on improving pt's dynamic standing balance with the rollator, challenging his gait stability to improve his safety with mobility. Pt was able to ambulate majority of session at a min guard assist level but required up to minA when ambulating heel-to-toe. Pt would benefit from further education on safe hand placement with transfers, currently pulling on rollator. Will continue to follow acutely. Current recommendations remain appropriate.     Recommendations for follow up therapy are one component of a multi-disciplinary discharge planning process, led by the attending physician.  Recommendations may be updated based on patient status, additional functional criteria and insurance authorization.  Follow Up Recommendations  Home health PT     Assistance Recommended at Discharge PRN  Patient can return home with the following A little help with bathing/dressing/bathroom;Assistance with cooking/housework   Equipment Recommendations  None recommended by PT    Recommendations for Other Services       Precautions / Restrictions Precautions Precautions: Fall Precaution Comments: watch HR Restrictions Weight Bearing Restrictions: No     Mobility  Bed Mobility Overal bed mobility: Modified Independent             General bed mobility comments: HOB elevated with pt using rails, no assistance needed,  extra time.    Transfers Overall transfer level: Needs assistance Equipment used: Rollator (4 wheels) Transfers: Sit to/from Stand Sit to Stand: Min guard, From elevated surface           General transfer comment: Min guard assist for safety coming to stand from slightly elevated EOB, per pt request. Educated pt to push up from bed to stand and reach back to sit for safety, but poor compliance.    Ambulation/Gait Ambulation/Gait assistance: Min guard, Min assist Gait Distance (Feet): 130 Feet Assistive device: Rollator (4 wheels) Gait Pattern/deviations: Step-through pattern, Trunk flexed, Decreased stride length Gait velocity: Decreased Gait velocity interpretation: <1.8 ft/sec, indicate of risk for recurrent falls   General Gait Details: Slow, mildly unsteady gait with flexed posture, likely due to chronic back pain. No LOB, min guard assist to ambulate forwards, sideways, and backwards but minA intermittently to ambulate forwards and backwards heel-to-toe   Stairs             Wheelchair Mobility    Modified Rankin (Stroke Patients Only)       Balance Overall balance assessment: Needs assistance Sitting-balance support: Feet supported, No upper extremity supported Sitting balance-Leahy Scale: Fair     Standing balance support: Bilateral upper extremity supported, During functional activity Standing balance-Leahy Scale: Poor Standing balance comment: Reliant on rollator for mobility               High Level Balance Comments: Pt cued to step anteriorly, posteriorly, and laterally >/= 10 ft each direction with rollator, min guard. Pt cued to step heel-to-toe anteriorly and posteriorly with rollator >/= 10 steps each, up to minA  Cognition Arousal/Alertness: Awake/alert Behavior During Therapy: WFL for tasks assessed/performed Overall Cognitive Status: Within Functional Limits for tasks assessed                                  General Comments: WFL for simple tasks; attention limited by tangential conversation requiring intermittent redirection to current topic/task; suspect baseline cognition        Exercises Other Exercises Other Exercises: Pt cued to step anteriorly, posteriorly, and laterally >/= 10 ft each direction with rollator, min guard. Pt cued to step heel-to-toe anteriorly and posteriorly with rollator >/= 10 steps each, up to [place] comments (skin integrity, edema, etc.): HR up to 147 bpm; HR 82 BP 136/68 supine; HR 102 BP 157/87 sitting; denied any lightheadedness throughout session; educated pt of PT's recs to use rollator at d/c, not his cane yet      Pertinent Vitals/Pain Pain Assessment Pain Assessment: Faces Faces Pain Scale: Hurts a little bit Pain Location: back Pain Descriptors / Indicators: Discomfort, Sore Pain Intervention(s): Limited activity within patient's tolerance, Monitored during session, Repositioned    Home Living                          Prior Function            PT Goals (current goals can now be found in the care plan section) Acute Rehab PT Goals Patient Stated Goal: to get better PT Goal Formulation: With patient Time For Goal Achievement: 12/04/21 Potential to Achieve Goals: Good Progress towards PT goals: Progressing toward goals    Frequency    Min 3X/week      PT Plan Current plan remains appropriate    Co-evaluation              AM-PAC PT "6 Clicks" Mobility   Outcome Measure  Help needed turning from your back to your side while in a flat bed without using bedrails?: None Help needed moving from lying on your back to sitting on the side of a flat bed without using bedrails?: None Help needed moving to and from a bed to a chair (including a wheelchair)?: A Little Help needed standing up from a chair using your arms (e.g., wheelchair or bedside chair)?: A Little Help needed to walk in hospital  room?: A Little Help needed climbing 3-5 steps with a railing? : A Little 6 Click Score: 20    End of Session Equipment Utilized During Treatment: Gait belt Activity Tolerance: Patient tolerated treatment well Patient left: in bed;with call bell/phone within reach;with bed alarm set Nurse Communication: Mobility status PT Visit Diagnosis: Other abnormalities of gait and mobility (R26.89);Unsteadiness on feet (R26.81)     Time: 8144-8185 PT Time Calculation (min) (ACUTE ONLY): 43 min  Charges:  $Gait Training: 8-22 mins $Therapeutic Exercise: 8-22 mins $Therapeutic Activity: 8-22 mins                     Moishe Spice, PT, DPT Acute Rehabilitation Services  Office: [place] 11/25/2021, 5:47 PM

## 2021-11-25 NOTE — Progress Notes (Signed)
Mobility Specialist: Progress Note   11/25/21 1244  Mobility  Activity Ambulated with assistance in hallway  Level of Assistance Minimal assist, patient does 75% or more  Assistive Device Front wheel walker  Distance Ambulated (ft) 130 ft  Activity Response Tolerated well  $Mobility charge 1 Mobility   Pre-Mobility: 93 HR, 96% SpO2 During Mobility: 148 HR Post-Mobility: 116 HR, 160/87 (106) BP, 97% SpO2  Pt received in the bed and agreeable to ambulation. C/o mild RLE discomfort during session, otherwise asymptomatic. Pt to recliner after session with call bell at his side. Pt set up with his lunch.   [facility] Evonne Rinks Mobility Specialist Mobility Specialist [address]: [phone]

## 2021-11-26 ENCOUNTER — Encounter: Payer: Medicare Other | Admitting: Internal Medicine

## 2021-11-26 ENCOUNTER — Other Ambulatory Visit (HOSPITAL_COMMUNITY): Payer: Self-pay

## 2021-11-26 DIAGNOSIS — I82423 Acute embolism and thrombosis of iliac vein, bilateral: Secondary | ICD-10-CM | POA: Diagnosis not present

## 2021-11-26 DIAGNOSIS — I480 Paroxysmal atrial fibrillation: Secondary | ICD-10-CM | POA: Diagnosis not present

## 2021-11-26 DIAGNOSIS — E1169 Type 2 diabetes mellitus with other specified complication: Secondary | ICD-10-CM | POA: Diagnosis not present

## 2021-11-26 LAB — GLUCOSE, CAPILLARY: Glucose-Capillary: 127 mg/dL — ABNORMAL HIGH (ref 70–99)

## 2021-11-26 LAB — BASIC METABOLIC PANEL
Anion gap: 8 (ref 5–15)
BUN: 29 mg/dL — ABNORMAL HIGH (ref 8–23)
CO2: 21 mmol/L — ABNORMAL LOW (ref 22–32)
Calcium: 8.2 mg/dL — ABNORMAL LOW (ref 8.9–10.3)
Chloride: 106 mmol/L (ref 98–111)
Creatinine, Ser: 1.26 mg/dL — ABNORMAL HIGH (ref 0.61–1.24)
GFR, Estimated: >60 mL/min (ref >60)
Glucose, Bld: 180 mg/dL — ABNORMAL HIGH (ref 70–99)
Potassium: 4.1 mmol/L (ref 3.5–5.1)
Sodium: 135 mmol/L (ref 135–145)

## 2021-11-26 LAB — CBC
HCT: 28.9 % — ABNORMAL LOW (ref 39.0–52.0)
Hemoglobin: 9.7 g/dL — ABNORMAL LOW (ref 13.0–17.0)
MCH: 30.7 pg (ref 26.0–34.0)
MCHC: 33.6 g/dL (ref 30.0–36.0)
MCV: 91.5 fL (ref 80.0–100.0)
Platelets: 149 10*3/uL — ABNORMAL LOW (ref 150–400)
RBC: 3.16 MIL/uL — ABNORMAL LOW (ref 4.22–5.81)
RDW: 14.4 % (ref 11.5–15.5)
WBC: 8.3 10*3/uL (ref 4.0–10.5)
nRBC: 0 % (ref 0.0–0.2)

## 2021-11-26 NOTE — Progress Notes (Signed)
Occupational Therapy Treatment Patient Details Name: Nicholas Lam MRN: [id number] DOB: [birth year] Today's Date: 11/26/2021   History of present illness Pt is a 66 y.o. 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 S/p mechanical thrombectomy, balloon venoplasty, ilio caval venogram R leg venogram 6/13. PMH includes PAF, s/p IVC filter (2009), DM2.   OT comments  Patient seen this date for ADL prior to discharge.  All goal areas appropriate for discharge.  He is close to baseline, but would benefit from [facility] OT to maximize his independence for meal prep and ADL when PCA is not there.  OT will defer to [facility] post acute.     Recommendations for follow up therapy are one component of a multi-disciplinary discharge planning process, led by the attending physician.  Recommendations may be updated based on patient status, additional functional criteria and insurance authorization.    Follow Up Recommendations  Home health OT    Assistance Recommended at Discharge Intermittent Supervision/Assistance  Patient can return home with the following      Equipment Recommendations  None recommended by OT    Recommendations for Other Services      Precautions / Restrictions Precautions Precautions: Fall Precaution Comments: watch HR Restrictions Weight Bearing Restrictions: No       Mobility Bed Mobility                    Transfers Overall transfer level: Needs assistance   Transfers: Sit to/from Stand Sit to Stand: Supervision                 Balance   Sitting-balance support: Feet supported, No upper extremity supported Sitting balance-Leahy Scale: Fair     Standing balance support: Bilateral upper extremity supported, During functional activity Standing balance-Leahy Scale: Poor                             ADL either performed or assessed with clinical judgement   ADL Overall ADL's : At baseline                                        General ADL Comments: assist as needed from PCA    Extremity/Trunk Assessment Upper Extremity Assessment Upper Extremity Assessment: Overall WFL for tasks assessed   Lower Extremity Assessment Lower Extremity Assessment: Generalized weakness   Cervical / Trunk Assessment Cervical / Trunk Assessment: Kyphotic    Vision Patient Visual Report: No change from baseline     Perception Perception Perception: Not tested   Praxis Praxis Praxis: Not tested    Cognition Arousal/Alertness: Awake/alert Behavior During Therapy: WFL for tasks assessed/performed Overall Cognitive Status: Within Functional Limits for tasks assessed                                          Exercises      Shoulder Instructions       General Comments 2/4 DOE    Pertinent Vitals/ Pain       Pain Assessment Pain Assessment: No/denies pain Pain Intervention(s): Monitored during session  Frequency           Progress Toward Goals  OT Goals(current goals can now be found in the care plan section)     Acute Rehab OT Goals OT Goal Formulation: All assessment and education complete, DC therapy  Plan All goals met and education completed, patient discharged from OT services    Co-evaluation                 AM-PAC OT "6 Clicks" Daily Activity     Outcome Measure   Help from another person eating meals?: None Help from another person taking care of personal grooming?: None Help from another person toileting, which includes using toliet, bedpan, or urinal?: A Little Help from another person bathing (including washing, rinsing, drying)?: A Little Help from another person to put on and taking off regular upper body clothing?: A Little Help from another person to put on and taking off regular lower body clothing?: A Little 6 Click Score: 20    End of  Session Equipment Utilized During Treatment: Rolling walker (2 wheels)  OT Visit Diagnosis: Unsteadiness on feet (R26.81)   Activity Tolerance Patient tolerated treatment well   Patient Left in chair;with call bell/phone within reach;with family/visitor present   Nurse Communication          Time: 1010-1030 OT Time Calculation (min): 20 min  Charges: OT General Charges $OT Visit: 1 Visit OT Treatments $Self Care/Home Management : 8-22 mins  11/26/2021  RP, OTR/L  Acute Rehabilitation Services  Office:  [phone]   Metta Clines 11/26/2021, 10:36 AM

## 2021-11-26 NOTE — TOC Transition Note (Signed)
Transition of Care (TOC) - CM/SW Discharge Note Marvetta Gibbons RN, BSN Transitions of Care Unit 4E- RN Case Manager See Treatment Team for direct phone #    Patient Details  Name: Mazen Marcin MRN: [id number] Date of Birth: [birth year]  Transition of Care [facility]) CM/SW Contact:  Dawayne Patricia, RN Phone Number: 11/26/2021, 10:07 AM   Clinical Narrative:    Pt stable for transition home today, HHPT/OT has been set up with Alvis Lemmings- who will f/u for start of care post discharge. PCP has been set up w/ [facility] for 6/22- appointment on AVS.    [facility] pharmacy has delivered meds/Eliquis to bedside.   Pt has transportation home today.   No further TOC needs noted.      Barriers to Discharge: Continued Medical Work up   Patient Goals and CMS Choice   CMS Medicare.gov Compare Post Acute Care list provided to:: Patient Choice offered to / list presented to : Patient  Discharge Placement                 Home w/ [facility]      Discharge Plan and Services   Discharge Planning Services: CM Consult Post Acute Care Choice: Home Health                    HH Arranged: PT, OT [facility] Agency: [place] Date [place]: 11/25/21   Representative spoke with at [place]: [place] ([place]) Interventions     Readmission Risk Interventions    11/26/2021   10:07 AM  Readmission Risk Prevention Plan  Post Dischage Appt Complete  Medication Screening Complete  Transportation Screening Complete

## 2021-11-26 NOTE — Plan of Care (Signed)
  Problem: Education: Goal: Ability to describe self-care measures that may prevent or decrease complications (Diabetes Survival Skills Education) will improve Outcome: Adequate for Discharge Goal: Individualized Educational Video(s) Outcome: Adequate for Discharge   Problem: Coping: Goal: Ability to adjust to condition or change in health will improve Outcome: Adequate for Discharge   Problem: Fluid Volume: Goal: Ability to maintain a balanced intake and output will improve Outcome: Adequate for Discharge   Problem: Health Behavior/Discharge Planning: Goal: Ability to identify and utilize available resources and services will improve Outcome: Adequate for Discharge Goal: Ability to manage health-related needs will improve Outcome: Adequate for Discharge   Problem: Activity: Goal: Risk for activity intolerance will decrease Outcome: Adequate for Discharge   Problem: Elimination: Goal: Will not experience complications related to bowel motility Outcome: Adequate for Discharge Goal: Will not experience complications related to urinary retention Outcome: Adequate for Discharge   Problem: Pain Managment: Goal: General experience of comfort will improve Outcome: Adequate for Discharge   Problem: Cardiovascular: Goal: Ability to achieve and maintain adequate cardiovascular perfusion will improve Outcome: Adequate for Discharge Goal: Vascular access site(s) Level 0-1 will be maintained Outcome: Adequate for Discharge   Problem: Health Behavior/Discharge Planning: Goal: Ability to safely manage health-related needs after discharge will improve Outcome: Adequate for Discharge

## 2021-11-26 NOTE — Care Management Important Message (Signed)
Important Message  Patient Details  Name: Nicholas Lam MRN: [id number] Date of Birth: Aug 09, 1955   Medicare Important Message Given:  Yes     Shelda Altes 11/26/2021, 7:51 AM

## 2021-11-26 NOTE — Discharge Summary (Signed)
PATIENT DETAILS Name: Nicholas Lam Age: 66 y.o. Sex: male Date of Birth: [birth year] MRN: [id number]. Admitting Physician: Lenore Cordia, MD [id number], No Pcp Per  Admit Date: 11/18/2021 Discharge date: 11/26/2021  Recommendations for Outpatient Follow-up:  Follow up with PCP in 1-2 weeks Please obtain CMP/CBC in one week Please ensure follow-up with vascular surgery  Admitted From:  Home  Disposition: Home health   Discharge Condition: good  CODE STATUS:   Code Status: Full Code   Diet recommendation:  Diet Order             Diet - low sodium heart healthy           Diet Carb Modified           Diet Carb Modified Fluid consistency: Thin; Room service appropriate? Yes  Diet effective now                    Brief Summary: Patient is a 66 y.o.  male with history of PAF, DM-2, left renal stone-who apparently had a IVC filter placed in 2009 to prevent VTE given his sedentary lifestyle-presented with significant right lower extremity swelling-found to have extensive venous thrombosis up to the level of IVC.   Significant events: 6/7>> admit to [facility]-RLE swelling-extensive DVT-IV heparin 6/13>> thrombectomy   Significant studies: 6/7>> RLE Doppler: Extensive DVT involving multiple veins. 6/8>> CT venogram: Extensive DVT involving IVC, common/internal/external iliac bilaterally and common femoral vein on the right.  Moderate to severe obstructive uropathy with 1.5 cm calculus at the UPJ.  Left adrenal adenoma.   Significant microbiology data:     Procedures: 6/13>> mechanical thrombectomy by vascular surgery   Consults: Vascular surgery.  Brief Hospital Course: Extensive RLE DVT-with left iliac vein DVT-and thrombosis extending up to the IVC: S/p thrombectomy on 6/13-maintained on IV heparin during this hospitalization-transition to Eliquis yesterday-doing well overnight.  Long discussion with patient-he does not want to get a 67-monthsupply of Eliquis, he  claims he will only get a month supply-and will make necessary arrangements to get further refills.  He is aware that he needs uninterrupted anticoagulation-and he remains at risk for VTE given IVC filter is in place.  He will follow-up with vascular surgery-and appropriate duration of anticoagulation will be deferred to the vascular surgeons.   AKI: Mild-likely due to hemodynamic mediated injury-in the setting of thrombectomy and mild blood loss-improving with IVF.   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  If he develops symptoms/infection-we will need to reconsult urology.   PAF:-Remains in sinus rhythm-now on anticoagulation.  DM-2 (A1c 8.2 on 6/8): Not a new diagnosis per patient-he previously was on metformin-but has been noncompliant-and does not take any medications at home.  Discussed at length-he does not want to be placed on any medications for his diabetes.  Per patient-he has been refusing SQ insulin in the hospital occasionally.  Mechanical fall 2 weeks prior to this hospitalization: PT/OT eval -Home health recommended.  Left adrenal gland nodule: This is a chronic issue for patient has apparently been worked up in the past.   BMI: Estimated body mass index is 35.97 kg/m as calculated from the following:   Height as of this encounter: '5\' 10"'$  (1.778 m).   Weight as of this encounter: 113.7 kg.     Discharge Diagnoses:  Principal Problem:   Acute bilateral deep vein thrombosis (DVT) of  iliac veins of lower extremities (HCC) Active Problems:   Paroxysmal atrial fibrillation (HCC)   Diabetes mellitus ([place])   Hyperglycemia   Discharge Instructions:  Activity:  As tolerated with Full fall precautions use walker/cane & assistance as needed   Discharge Instructions     Call MD for:  extreme fatigue   Complete by: As  directed    Call MD for:  persistant dizziness or light-headedness   Complete by: As directed    Call MD for:  redness, tenderness, or signs of infection (pain, swelling, redness, odor or green/yellow discharge around incision site)   Complete by: As directed    Diet - low sodium heart healthy   Complete by: As directed    Diet Carb Modified   Complete by: As directed    Discharge instructions   Complete by: As directed    Follow with Primary MD  in 1-2 weeks-please discuss with your primary MD/primary care practitioner about being started on diabetic medications.  Please follow-up with vascular surgery as instructed.  Please ensure that you get refills for Eliquis before they run out.  Please get a complete blood count and chemistry panel checked by your Primary MD at your next visit, and again as instructed by your Primary MD.  Get Medicines reviewed and adjusted: Please take all your medications with you for your next visit with your Primary MD  Laboratory/radiological data: Please request your Primary MD to go over all hospital tests and procedure/radiological results at the follow up, please ask your Primary MD to get all Hospital records sent to his/her office.  In some cases, they will be blood work, cultures and biopsy results pending at the time of your discharge. Please request that your primary care M.D. follows up on these results.  Also Note the following: If you experience worsening of your admission symptoms, develop shortness of breath, life threatening emergency, suicidal or homicidal thoughts you must seek medical attention immediately by calling 911 or calling your MD immediately  if symptoms less severe.  You must read complete instructions/literature along with all the possible adverse reactions/side effects for all the Medicines you take and that have been prescribed to you. Take any new Medicines after you have completely understood and accpet all the possible  adverse reactions/side effects.   Do not drive when taking Pain medications or sleeping medications (Benzodaizepines)  Do not take more than prescribed Pain, Sleep and Anxiety Medications. It is not advisable to combine anxiety,sleep and pain medications without talking with your primary care practitioner  Special Instructions: If you have smoked or chewed Tobacco  in the last 2 yrs please stop smoking, stop any regular Alcohol  and or any Recreational drug use.  Wear Seat belts while driving.  Please note: You were cared for by a hospitalist during your hospital stay. Once you are discharged, your primary care physician will handle any further medical issues. Please note that NO REFILLS for any discharge medications will be authorized once you are discharged, as it is imperative that you return to your primary care physician (or establish a relationship with a primary care physician if you do not have one) for your post hospital discharge needs so that they can reassess your need for medications and monitor your lab values.   Increase activity slowly   Complete by: As directed    No wound care   Complete by: As directed       Allergies as of 11/26/2021  Reactions   Iodinated Contrast Media Hives, Rash, Swelling   Iodine         Medication List     STOP taking these medications    ibuprofen 200 MG tablet Commonly known as: ADVIL       TAKE these medications    Eliquis DVT/PE Starter Pack Generic drug: Apixaban Starter Pack ('10mg'$  and '5mg'$ ) Take 10 mg (2 tablets) twice daily for 7 days starting on 6/14, and then 5 mg twice daily.   ONE-A-DAY MENS 50+ PO Take 1 tablet by mouth daily.   OVER THE COUNTER MEDICATION Place 2 drops into both eyes 2 (two) times daily as needed (blurry vision). Saline eye drops   Vitamin D3 250 MCG (10000 UT) capsule Take 10,000 Units by mouth daily.        Follow-up Information     Lajean Manes, MD. Go on 12/03/2021.   Specialty:  Internal Medicine Why: TIME : 3:15pm DATE: JUNE  22 ,2023 ADDRESS: [facility] [place] information: [place] [address] [phone]         Care, [facility] Follow up.   Specialty: Home Health Services Why: The home health agency will contact you for the first home visit. Contact information: [place] [address] [phone]         [facility] Follow up in 6 week(s).   Specialty: Vascular Surgery Why: Office will call you to arrange your appt (sent) Contact information: [place] [address] [phone]        Alexis Frock, MD. Schedule an appointment as soon as possible for a visit in 2 week(s).   Specialty: Urology Why: Hospital follow up-for your left ureteral stone. Contact information: [place] [address] [phone]                Allergies  Allergen Reactions   Iodinated Contrast Media Hives, Rash and Swelling   Iodine      Other Procedures/Studies: PERIPHERAL VASCULAR CATHETERIZATION  Result Date: 11/24/2021 Images from the original result were not included. Patient name: Nicholas Lam MRN: [id number] DOB: Dec 10, 1955 Sex: male 11/24/2021 Pre-operative Diagnosis: DVT Post-operative diagnosis:  Same Surgeon:  Annamarie Major Procedure Performed:  1.  Ultrasound-guided access, left popliteal vein  2.  Ultrasound-guided access, right popliteal vein  3.  Intravascular ultrasound (IVUS): Inferior vena cava, bilateral common iliac vein, bilateral external iliac vein, bilateral common femoral vein, right femoral vein, right popliteal vein  4.  Mechanical thrombectomy:  Inferior vena cava, bilateral common iliac vein, bilateral external iliac vein, bilateral common femoral vein, right femoral vein, right popliteal vein  5.  Balloon venoplasty:   bilateral common iliac vein, bilateral external iliac vein, bilateral common femoral vein, right femoral vein, right popliteal vein  6.  Ilio caval venogram, right leg venogram  7.  Conscious sedation, 103 minutes Indications: This is a 66 year old gentleman with history of IVC filter who comes in with severe right leg swelling.  Preoperative imaging included a CT venogram as well as right leg ultrasound.  He did not want his filter removed. Procedure:  The patient was identified in the holding area and taken to room 8.  The patient was then placed supine on the table and prepped and draped in the usual sterile fashion.  A time out was called.  Conscious sedation was administered with the  use of IV fentanyl and Versed under continuous physician and nurse monitoring.  Heart rate, blood pressure, and oxygen saturation were continuously monitored.  Total sedation time was 103 minutes.  Ultrasound was used to evaluate the right popliteal vein.  There was thrombus present and it was dilated.  It was not compressible.  It was cannulated under ultrasound guidance with a micropuncture needle.  A [place] wire was advanced followed by micropuncture sheath.  I then injected contrast to confirm that I was in the vein.  A Glidewire advantage was then advanced into the right subclavian vein and an 8 French sheath was placed.  I then evaluated the left popliteal vein with ultrasound.  This vein was dilated but compressible.  It was cannulated under ultrasound guidance with a micropuncture needle.  An 018 wire was inserted followed by placement of micropuncture sheath.  Next a Bentson wire was inserted and a 8 [place] sheath was placed.  The patient was then fully heparinized.  I then used intravascular ultrasound to evaluate the right popliteal, femoral, common femoral, external iliac and common iliac veins as well as the inferior vena cava.  This showed thrombus throughout.  Next, a Gore dry seal 16 French sheath was placed.  I  then inserted the CAT 16 venous mechanical thrombectomy device.  I performed mechanical thrombectomy of the right popliteal, femoral, common femoral, external iliac, and common iliac veins as well as atria vena cava.  Multiple passes were made evacuating a significant amount of thrombus.  IVUS was used multiple times to ensure that most of the thrombus was evacuated.  There was no obvious stenosis.  I performed venography which showed some residual thrombus particularly in the vena cava filter.  I then performed balloon venoplasty of the right popliteal, femoral, common femoral, external iliac, and common iliac veins.  Now had established a flow channel. Attention was then turned towards the left iliac venous system.  I did not want to upsized the sheath in the left popliteal vein and so I snared the wire from the right leg via a 10 French snare and the vena cava from the left.  I brought the wire out through the sheath in the left popliteal vein for through and through access.  I then reinserted the CAT 16 device and performed mechanical thrombectomy of the left common iliac, external iliac, and common femoral vein.  Balloon venoplasty was then performed with a 12 mm balloon of the left common iliac, external iliac, and common femoral vein.  I reevaluated this with ultrasound and saw a residual thrombus within the iliac system and so an additional pass with the CAT 16 device was made.  Venography was then performed of the iliac system which now showed inline flow through the left iliac veins into the IVC.  There appeared to be residual thrombus within the vena cava filter and so I again repeated mechanical thrombectomy using the CAT 16 device of the filter.  Multiple additional injections were performed there was still some retained chronic thrombus within the filter but there was a good flow channel through this.  At this point, we had lost approximately 1 L of blood and I did not feel proceeding would be  contagious and so I elected to terminate the procedure.  The sheaths were removed and 3-0 nylon bolster sutures were used to close the venography sites.  Sterile dressings were applied followed by compression wrap.  The patient tolerated the procedure well there were no immediate complications Impression:  #  1  Successful percutaneous mechanical thrombectomy using the CAT 16 device the inferior vena cava, bilateral common iliac vein, bilateral external iliac veins, bilateral common femoral vein, the right femoral and right popliteal veins  #2  Balloon venoplasty was performed using a 12 mm balloon  #3  Massive amounts of thrombus were evacuated.  There is still a small amount of residual chronic thrombus within the filter.  #4  If the patient continues to have difficulty with leg swelling, I would consider removing the filter with the angio [facility] device as protection.  Hopefully with the decrease in thrombus burden, he will be significantly better and will not require additional treatment  #5  The patient will continue with anticoagulation.  He has compression wraps on his legs now and will be transition to compression stockings tomorrow once the sutures have been removed from bilateral popliteal veins V. Annamarie Major, M.D., [facility] [place] Office: [phone] Pager:  [phone]   CARDIAC CATHETERIZATION  Result Date: 11/24/2021 Images from the original result were not included. Patient name: Nicholas Lam MRN: [id number] DOB: 04-04-1956 Sex: male 11/24/2021 Pre-operative Diagnosis: DVT Post-operative diagnosis:  Same Surgeon:  Annamarie Major Procedure Performed:  1.  Ultrasound-guided access, left popliteal vein  2.  Ultrasound-guided access, right popliteal vein  3.  Intravascular ultrasound (IVUS): Inferior vena cava, bilateral common iliac vein, bilateral external iliac vein, bilateral common femoral vein, right femoral vein, right popliteal vein  4.  Mechanical thrombectomy:   Inferior vena cava, bilateral common iliac vein, bilateral external iliac vein, bilateral common femoral vein, right femoral vein, right popliteal vein  5.  Balloon venoplasty:  bilateral common iliac vein, bilateral external iliac vein, bilateral common femoral vein, right femoral vein, right popliteal vein  6.  Ilio caval venogram, right leg venogram  7.  Conscious sedation, 103 minutes Indications: This is a 66 year old gentleman with history of IVC filter who comes in with severe right leg swelling.  Preoperative imaging included a CT venogram as well as right leg ultrasound.  He did not want his filter removed. Procedure:  The patient was identified in the holding area and taken to room 8.  The patient was then placed supine on the table and prepped and draped in the usual sterile fashion.  A time out was called.  Conscious sedation was administered with the use of IV fentanyl and Versed under continuous physician and nurse monitoring.  Heart rate, blood pressure, and oxygen saturation were continuously monitored.  Total sedation time was 103 minutes.  Ultrasound was used to evaluate the right popliteal vein.  There was thrombus present and it was dilated.  It was not compressible.  It was cannulated under ultrasound guidance with a micropuncture needle.  A Obinna wire was advanced followed by micropuncture sheath.  I then injected contrast to confirm that I was in the vein.  A Glidewire advantage was then advanced into the right subclavian vein and an 8 French sheath was placed.  I then evaluated the left popliteal vein with ultrasound.  This vein was dilated but compressible.  It was cannulated under ultrasound guidance with a micropuncture needle.  An 018 wire was inserted followed by placement of micropuncture sheath.  Next a Bentson wire was inserted and a 8 [place] sheath was placed.  The patient was then fully heparinized.  I then used intravascular ultrasound to evaluate the right popliteal, femoral,  common femoral, external iliac and common iliac veins as well as the inferior vena cava.  This showed thrombus throughout.  Next, a Gore dry seal 16 French sheath was placed.  I then inserted the CAT 16 venous mechanical thrombectomy device.  I performed mechanical thrombectomy of the right popliteal, femoral, common femoral, external iliac, and common iliac veins as well as atria vena cava.  Multiple passes were made evacuating a significant amount of thrombus.  IVUS was used multiple times to ensure that most of the thrombus was evacuated.  There was no obvious stenosis.  I performed venography which showed some residual thrombus particularly in the vena cava filter.  I then performed balloon venoplasty of the right popliteal, femoral, common femoral, external iliac, and common iliac veins.  Now had established a flow channel. Attention was then turned towards the left iliac venous system.  I did not want to upsized the sheath in the left popliteal vein and so I snared the wire from the right leg via a 10 French snare and the vena cava from the left.  I brought the wire out through the sheath in the left popliteal vein for through and through access.  I then reinserted the CAT 16 device and performed mechanical thrombectomy of the left common iliac, external iliac, and common femoral vein.  Balloon venoplasty was then performed with a 12 mm balloon of the left common iliac, external iliac, and common femoral vein.  I reevaluated this with ultrasound and saw a residual thrombus within the iliac system and so an additional pass with the CAT 16 device was made.  Venography was then performed of the iliac system which now showed inline flow through the left iliac veins into the IVC.  There appeared to be residual thrombus within the vena cava filter and so I again repeated mechanical thrombectomy using the CAT 16 device of the filter.  Multiple additional injections were performed there was still some retained chronic  thrombus within the filter but there was a good flow channel through this.  At this point, we had lost approximately 1 L of blood and I did not feel proceeding would be contagious and so I elected to terminate the procedure.  The sheaths were removed and 3-0 nylon bolster sutures were used to close the venography sites.  Sterile dressings were applied followed by compression wrap.  The patient tolerated the procedure well there were no immediate complications Impression:  #1  Successful percutaneous mechanical thrombectomy using the CAT 16 device the inferior vena cava, bilateral common iliac vein, bilateral external iliac veins, bilateral common femoral vein, the right femoral and right popliteal veins  #2  Balloon venoplasty was performed using a 12 mm balloon  #3  Massive amounts of thrombus were evacuated.  There is still a small amount of residual chronic thrombus within the filter.  #4  If the patient continues to have difficulty with leg swelling, I would consider removing the filter with the angio [facility] device as protection.  Hopefully with the decrease in thrombus burden, he will be significantly better and will not require additional treatment  #5  The patient will continue with anticoagulation.  He has compression wraps on his legs now and will be transition to compression stockings tomorrow once the sutures have been removed from bilateral popliteal veins V. Annamarie Major, M.D., FACS [facility] [place] Office: [phone] Pager:  [phone]   CT VENOGRAM ABD/PEL  Addendum Date: 11/19/2021   ADDENDUM REPORT: 11/19/2021 03:28 ADDENDUM: Critical findings were reported to Dr. Alcario Drought at 3:27 a.m. Electronically Signed  By: Brett Fairy M.D.   On: 11/19/2021 03:28   Result Date: 11/19/2021 CLINICAL DATA:  Deep venous thrombosis. EXAM: CT VENOGRAM ABDOMEN AND PELVIS TECHNIQUE: Multidetector CT imaging of the abdomen and pelvis was performed using standard protocol during bolus  administration of intravenous contrast. Multiplanar reconstructed images and MIPS were obtained and reviewed to evaluate the vascular anatomy. RADIATION DOSE REDUCTION: This exam was performed according to the departmental dose-optimization program which includes automated exposure control, adjustment of the mA and/or kV according to patient size and/or use of iterative reconstruction technique. CONTRAST:  12m OMNIPAQUE IOHEXOL 350 MG/ML SOLN COMPARISON:  None Available. FINDINGS: There is occlusion of the common femoral, external iliac, and common iliac veins on the right. Thrombus is present in the proximal external iliac and common iliac veins on the left. There is likely occlusive thrombus involving the internal iliac vein on the right and partial occlusion of the internal iliac vein on the left. There is thrombus in the IVC to the level of a IVC filter in the infrarenal region. Distal to the IVC filter. No thrombus is identified. The portal vein, superior mesenteric vein, and splenic vein are patent. Heart is enlarged and there is no pericardial effusion. Atelectasis and mild fibrotic changes are noted at the lung bases. Subcentimeter hypodensities are present in the liver which are too small to further characterize. Hepatic steatosis is noted. The gallbladder is without stones. No biliary ductal dilatation. Pancreas is unremarkable. No pancreatic ductal dilatation or surrounding fat stranding. The spleen is mildly enlarged at 14 cm in length. No focal abnormality is seen. The right adrenal gland is within normal limits. There is a 2.2 cm nodule in the left adrenal gland with attenuation of -5 Hounsfield units, compatible with adenoma. The kidneys enhance symmetrically. Evaluation for renal calculi is limited due to the presence of contrast at the renal pyramids bilaterally. There is moderate to severe hydronephrosis on the left with multiple calcifications in the left renal pelvis, the largest at the  ureteropelvic junction measuring 1.5 cm. No obstructive uropathy on the right. Bladder is within normal limits. The stomach is within normal limits. No bowel obstruction, free air, or pneumatosis. Scattered diverticula are present along the colon without evidence of diverticulitis. The appendix is not visualized on exam. No focal bowel wall thickening. There is atherosclerotic calcification of the aorta without evidence of aneurysm. No abdominal or pelvic lymphadenopathy. Prostate gland is unremarkable. There small fat containing inguinal hernias bilaterally. Evidence of prior abdominal wall hernia repairs is noted. There is a fat containing anterior abdominal wall hernia in the midline adjacent to prior hernia repair. No abdominopelvic ascites. A trace amount of edema is noted in the presacral space. Subcutaneous fat stranding is noted at the right hip laterally. Degenerative changes are present in the thoracolumbar spine. There are mild compression deformities in the superior endplate at L1 and superior endplate at L3, indeterminate in age. There are severe degenerative changes at the right hip and mild degenerative changes at the left hip. Sclerotic regions are present in the proximal right femur, likely bone infarct or enchondroma. IMPRESSION: 1. 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 The IVC thrombus extends to the level of a IVC filter. No thrombus is seen distal to the IVC filter. 2. 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 3. Left adrenal adenoma. 4. Subcutaneous fat  stranding over the lateral aspect of the right hip, possible edema, contusion, or infection. 5. Mild compression deformities at L1 and L3, indeterminate in age. 6. Aortic atherosclerosis. 7. Remaining chronic findings as described above. Electronically Signed: By: Brett Fairy  M.D. On: 11/19/2021 03:13   VAS [place] LOWER EXTREMITY VENOUS (DVT) (7a-7p)  Result Date: 11/18/2021  Lower Venous DVT Study Patient Name:  Nicholas Lam  Date of Exam:   11/18/2021 Medical Rec #: [id number]      Accession #:    [id number] Date of Birth: 11-Sep-1955     Patient Gender: M Patient Age:   85 years Exam Location:  [facility] Procedure:      VAS [place] LOWER EXTREMITY VENOUS (DVT) Referring Phys: HALEY SAGE --------------------------------------------------------------------------------  Indications: Swelling, and Pain.  Risk Factors: Patient had IVC filter placed in 2009. States he was diagnosed to DVT without imaging study - only through elevated D-dimer. Limitations: Body habitus and poor ultrasound/tissue interface. Comparison Study: No previous exams Performing Technologist: Jody Hill RVT, RDMS  Examination Guidelines: A complete evaluation includes B-mode imaging, spectral Doppler, color Doppler, and power Doppler as needed of all accessible portions of each vessel. Bilateral testing is considered an integral part of a complete examination. Limited examinations for reoccurring indications may be performed as noted. The reflux portion of the exam is performed with the patient in reverse Trendelenburg.  +---------+---------------+---------+-----------+----------+--------------+ RIGHT    CompressibilityPhasicitySpontaneityPropertiesThrombus Aging +---------+---------------+---------+-----------+----------+--------------+ CFV      None           No       No                   Acute          +---------+---------------+---------+-----------+----------+--------------+ SFJ      None                                         Acute          +---------+---------------+---------+-----------+----------+--------------+ FV Prox  None           No       No                   Acute          +---------+---------------+---------+-----------+----------+--------------+ FV Mid   None            No       No                   Acute          +---------+---------------+---------+-----------+----------+--------------+ FV DistalNone           No       No                   Acute          +---------+---------------+---------+-----------+----------+--------------+ PFV      None           No       No                   Acute          +---------+---------------+---------+-----------+----------+--------------+ POP      None           No       No  Acute          +---------+---------------+---------+-----------+----------+--------------+ Gastroc  None           No       No                   Acute          +---------+---------------+---------+-----------+----------+--------------+ GSV      None           No       No                   Acute          +---------+---------------+---------+-----------+----------+--------------+ SSV      None           No       No                   Acute          +---------+---------------+---------+-----------+----------+--------------+   Right Technical Findings: Only able to see proximal portion of SSV due to immobility  +----+---------------+---------+-----------+----------+--------------+ LEFTCompressibilityPhasicitySpontaneityPropertiesThrombus Aging +----+---------------+---------+-----------+----------+--------------+ CFV Full           Yes      Yes                                 +----+---------------+---------+-----------+----------+--------------+ Rouleaux flow seen in CFV    Summary: RIGHT: - Findings consistent with acute deep vein thrombosis involving the right common femoral vein, SF junction, right femoral vein, right proximal profunda vein, right popliteal vein, right posterior tibial veins, right peroneal veins, and right gastrocnemius veins. - Findings consistent with acute superficial vein thrombosis involving the right small saphenous vein, and right great saphenous vein. - No cystic structure  found in the popliteal fossa.  LEFT: - No evidence of common femoral vein obstruction.  *See table(s) above for measurements and observations. Electronically signed by Harold Barban MD on 11/18/2021 at 10:56:30 PM.    Final    VAS [place] IVC/ILIAC (VENOUS ONLY)  Result Date: 11/18/2021 IVC/ILIAC STUDY Patient Name:  Nicholas Lam  Date of Exam:   11/18/2021 Medical Rec #: [id number]      Accession #:    [id number] Date of Birth: [birth year]     Patient Gender: M Patient Age:   64 years Exam [facility]:  [facility] Procedure:      VAS [place] IVC/ILIAC (VENOUS ONLY) Referring Phys: --------------------------------------------------------------------------------  Indications: RLE DVT Limitations: Obesity and Abdominal hernia.  Comparison Study: No previous exams Performing Technologist: Jody Hill RVT, RDMS  Examination Guidelines: A complete evaluation includes B-mode imaging, spectral Doppler, color Doppler, and power Doppler as needed of all accessible portions of each vessel. Bilateral testing is considered an integral part of a complete examination. Limited examinations for reoccurring indications may be performed as noted.  IVC/Iliac Findings: +----------+------+--------+--------------+    IVC    PatentThrombus   Comments    +----------+------+--------+--------------+ IVC Prox                not visualized +----------+------+--------+--------------+ IVC Mid                 not visualized +----------+------+--------+--------------+ IVC Distal              not visualized +----------+------+--------+--------------+  +-------------------+---------+-----------+---------+-----------+--------+         CIV        RT-PatentRT-ThrombusLT-PatentLT-ThrombusComments +-------------------+---------+-----------+---------+-----------+--------+ Common Iliac Prox  acute               acute            +-------------------+---------+-----------+---------+-----------+--------+ Common Iliac Mid                acute               acute            +-------------------+---------+-----------+---------+-----------+--------+ Common Iliac Distal                                acute            +-------------------+---------+-----------+---------+-----------+--------+ Right distal CIV not visualized +-------------------------+---------+-----------+---------+-----------+--------+            EIV           RT-PatentRT-ThrombusLT-PatentLT-ThrombusComments +-------------------------+---------+-----------+---------+-----------+--------+ External Iliac Vein Prox             acute               acute            +-------------------------+---------+-----------+---------+-----------+--------+ External Iliac Vein Mid              acute               acute            +-------------------------+---------+-----------+---------+-----------+--------+ External Iliac Vein                  acute               acute            Distal                                                                    +-------------------------+---------+-----------+---------+-----------+--------+  Non occlusive thrombus seen in left EIV  Summary: IVC/Iliac: There is evidence of acute thrombus involving the right common iliac vein. There is evidence of acute thrombus involving the left common iliac vein. There is evidence of acute thrombus involving the right external iliac vein. There is evidence  of nonacute thrombus involving the left external iliac vein. IVC not visualized due to overlying abdominal hernia.  *See table(s) above for measurements and observations.  Electronically signed by Harold Barban MD on 11/18/2021 at 10:54:34 PM.    Final      TODAY-DAY OF DISCHARGE:  Subjective:   Nicholas Lam today has no headache,no chest abdominal pain,no new weakness tingling or numbness, feels much better wants to go home today.   Objective:   Blood pressure 131/73, pulse 62, temperature 97.8 F  (36.6 C), temperature source Oral, resp. rate 19, height '5\' 10"'$  (1.778 m), weight 114.7 kg, SpO2 97 %.  Intake/Output Summary (Last 24 hours) at 11/26/2021 0850 Last data filed at 11/26/2021 0734 Gross per 24 hour  Intake 699.15 ml  Output 2850 ml  Net -2150.85 ml   Filed Weights   11/24/21 0345 11/25/21 0321 11/26/21 9563  Weight: 115.7 kg 113.7 kg 114.7 kg    Exam: Awake Alert, Oriented *3, No new F.N deficits, Normal affect [place].AT,PERRAL Supple Neck,No JVD, No cervical lymphadenopathy appriciated.  Symmetrical Chest wall movement, Good air movement bilaterally, CTAB RRR,No Gallops,Rubs  or new Murmurs, No Parasternal Heave +ve B.Sounds, Abd Soft, Non tender, No organomegaly appriciated, No rebound -guarding or rigidity. No Cyanosis, Clubbing or edema, No new Rash or bruise   PERTINENT RADIOLOGIC STUDIES: PERIPHERAL VASCULAR CATHETERIZATION  Result Date: 11/24/2021 Images from the original result were not included. Patient name: Nicholas Lam MRN: [id number] DOB: [birth year] Sex: male 11/24/2021 Pre-operative Diagnosis: DVT Post-operative diagnosis:  Same Surgeon:  Annamarie Major Procedure Performed:  1.  Ultrasound-guided access, left popliteal vein  2.  Ultrasound-guided access, right popliteal vein  3.  Intravascular ultrasound (IVUS): Inferior vena cava, bilateral common iliac vein, bilateral external iliac vein, bilateral common femoral vein, right femoral vein, right popliteal vein  4.  Mechanical thrombectomy:  Inferior vena cava, bilateral common iliac vein, bilateral external iliac vein, bilateral common femoral vein, right femoral vein, right popliteal vein  5.  Balloon venoplasty:  bilateral common iliac vein, bilateral external iliac vein, bilateral common femoral vein, right femoral vein, right popliteal vein  6.  Ilio caval venogram, right leg venogram  7.  Conscious sedation, 103 minutes Indications: This is a 66 year old gentleman with history of IVC filter who comes in with  severe right leg swelling.  Preoperative imaging included a CT venogram as well as right leg ultrasound.  He did not want his filter removed. Procedure:  The patient was identified in the holding area and taken to room 8.  The patient was then placed supine on the table and prepped and draped in the usual sterile fashion.  A time out was called.  Conscious sedation was administered with the use of IV fentanyl and Versed under continuous physician and nurse monitoring.  Heart rate, blood pressure, and oxygen saturation were continuously monitored.  Total sedation time was 103 minutes.  Ultrasound was used to evaluate the right popliteal vein.  There was thrombus present and it was dilated.  It was not compressible.  It was cannulated under ultrasound guidance with a micropuncture needle.  A [place] wire was advanced followed by micropuncture sheath.  I then injected contrast to confirm that I was in the vein.  A Glidewire advantage was then advanced into the right subclavian vein and an 8 French sheath was placed.  I then evaluated the left popliteal vein with ultrasound.  This vein was dilated but compressible.  It was cannulated under ultrasound guidance with a micropuncture needle.  An 018 wire was inserted followed by placement of micropuncture sheath.  Next a Bentson wire was inserted and a 8 [place] sheath was placed.  The patient was then fully heparinized.  I then used intravascular ultrasound to evaluate the right popliteal, femoral, common femoral, external iliac and common iliac veins as well as the inferior vena cava.  This showed thrombus throughout.  Next, a Gore dry seal 16 French sheath was placed.  I then inserted the CAT 16 venous mechanical thrombectomy device.  I performed mechanical thrombectomy of the right popliteal, femoral, common femoral, external iliac, and common iliac veins as well as atria vena cava.  Multiple passes were made evacuating a significant amount of thrombus.  IVUS was used  multiple times to ensure that most of the thrombus was evacuated.  There was no obvious stenosis.  I performed venography which showed some residual thrombus particularly in the vena cava filter.  I then performed balloon venoplasty of the right popliteal, femoral, common femoral, external iliac, and common iliac veins.  Now had established a flow channel. Attention was then turned towards the left iliac  venous system.  I did not want to upsized the sheath in the left popliteal vein and so I snared the wire from the right leg via a 10 French snare and the vena cava from the left.  I brought the wire out through the sheath in the left popliteal vein for through and through access.  I then reinserted the CAT 16 device and performed mechanical thrombectomy of the left common iliac, external iliac, and common femoral vein.  Balloon venoplasty was then performed with a 12 mm balloon of the left common iliac, external iliac, and common femoral vein.  I reevaluated this with ultrasound and saw a residual thrombus within the iliac system and so an additional pass with the CAT 16 device was made.  Venography was then performed of the iliac system which now showed inline flow through the left iliac veins into the IVC.  There appeared to be residual thrombus within the vena cava filter and so I again repeated mechanical thrombectomy using the CAT 16 device of the filter.  Multiple additional injections were performed there was still some retained chronic thrombus within the filter but there was a good flow channel through this.  At this point, we had lost approximately 1 L of blood and I did not feel proceeding would be contagious and so I elected to terminate the procedure.  The sheaths were removed and 3-0 nylon bolster sutures were used to close the venography sites.  Sterile dressings were applied followed by compression wrap.  The patient tolerated the procedure well there were no immediate complications Impression:  #1   Successful percutaneous mechanical thrombectomy using the CAT 16 device the inferior vena cava, bilateral common iliac vein, bilateral external iliac veins, bilateral common femoral vein, the right femoral and right popliteal veins  #2  Balloon venoplasty was performed using a 12 mm balloon  #3  Massive amounts of thrombus were evacuated.  There is still a small amount of residual chronic thrombus within the filter.  #4  If the patient continues to have difficulty with leg swelling, I would consider removing the filter with the angio [facility] device as protection.  Hopefully with the decrease in thrombus burden, he will be significantly better and will not require additional treatment  #5  The patient will continue with anticoagulation.  He has compression wraps on his legs now and will be transition to compression stockings tomorrow once the sutures have been removed from bilateral popliteal veins V. Annamarie Major, M.D., [facility] [place] Office: [phone] Pager:  [phone]   CARDIAC CATHETERIZATION  Result Date: 11/24/2021 Images from the original result were not included. Patient name: Nicholas Lam MRN: [id number] DOB: 19-Apr-1956 Sex: male 11/24/2021 Pre-operative Diagnosis: DVT Post-operative diagnosis:  Same Surgeon:  Annamarie Major Procedure Performed:  1.  Ultrasound-guided access, left popliteal vein  2.  Ultrasound-guided access, right popliteal vein  3.  Intravascular ultrasound (IVUS): Inferior vena cava, bilateral common iliac vein, bilateral external iliac vein, bilateral common femoral vein, right femoral vein, right popliteal vein  4.  Mechanical thrombectomy:  Inferior vena cava, bilateral common iliac vein, bilateral external iliac vein, bilateral common femoral vein, right femoral vein, right popliteal vein  5.  Balloon venoplasty:  bilateral common iliac vein, bilateral external iliac vein, bilateral common femoral vein, right femoral vein, right popliteal vein   6.  Ilio caval venogram, right leg venogram  7.  Conscious sedation, 103 minutes Indications: This is a 66 year old gentleman with history of IVC  filter who comes in with severe right leg swelling.  Preoperative imaging included a CT venogram as well as right leg ultrasound.  He did not want his filter removed. Procedure:  The patient was identified in the holding area and taken to room 8.  The patient was then placed supine on the table and prepped and draped in the usual sterile fashion.  A time out was called.  Conscious sedation was administered with the use of IV fentanyl and Versed under continuous physician and nurse monitoring.  Heart rate, blood pressure, and oxygen saturation were continuously monitored.  Total sedation time was 103 minutes.  Ultrasound was used to evaluate the right popliteal vein.  There was thrombus present and it was dilated.  It was not compressible.  It was cannulated under ultrasound guidance with a micropuncture needle.  A [place] wire was advanced followed by micropuncture sheath.  I then injected contrast to confirm that I was in the vein.  A Glidewire advantage was then advanced into the right subclavian vein and an 8 French sheath was placed.  I then evaluated the left popliteal vein with ultrasound.  This vein was dilated but compressible.  It was cannulated under ultrasound guidance with a micropuncture needle.  An 018 wire was inserted followed by placement of micropuncture sheath.  Next a Bentson wire was inserted and a 8 [place] sheath was placed.  The patient was then fully heparinized.  I then used intravascular ultrasound to evaluate the right popliteal, femoral, common femoral, external iliac and common iliac veins as well as the inferior vena cava.  This showed thrombus throughout.  Next, a Gore dry seal 16 French sheath was placed.  I then inserted the CAT 16 venous mechanical thrombectomy device.  I performed mechanical thrombectomy of the right popliteal, femoral,  common femoral, external iliac, and common iliac veins as well as atria vena cava.  Multiple passes were made evacuating a significant amount of thrombus.  IVUS was used multiple times to ensure that most of the thrombus was evacuated.  There was no obvious stenosis.  I performed venography which showed some residual thrombus particularly in the vena cava filter.  I then performed balloon venoplasty of the right popliteal, femoral, common femoral, external iliac, and common iliac veins.  Now had established a flow channel. Attention was then turned towards the left iliac venous system.  I did not want to upsized the sheath in the left popliteal vein and so I snared the wire from the right leg via a 10 French snare and the vena cava from the left.  I brought the wire out through the sheath in the left popliteal vein for through and through access.  I then reinserted the CAT 16 device and performed mechanical thrombectomy of the left common iliac, external iliac, and common femoral vein.  Balloon venoplasty was then performed with a 12 mm balloon of the left common iliac, external iliac, and common femoral vein.  I reevaluated this with ultrasound and saw a residual thrombus within the iliac system and so an additional pass with the CAT 16 device was made.  Venography was then performed of the iliac system which now showed inline flow through the left iliac veins into the IVC.  There appeared to be residual thrombus within the vena cava filter and so I again repeated mechanical thrombectomy using the CAT 16 device of the filter.  Multiple additional injections were performed there was still some retained chronic thrombus within the filter but  there was a good flow channel through this.  At this point, we had lost approximately 1 L of blood and I did not feel proceeding would be contagious and so I elected to terminate the procedure.  The sheaths were removed and 3-0 nylon bolster sutures were used to close the  venography sites.  Sterile dressings were applied followed by compression wrap.  The patient tolerated the procedure well there were no immediate complications Impression:  #1  Successful percutaneous mechanical thrombectomy using the CAT 16 device the inferior vena cava, bilateral common iliac vein, bilateral external iliac veins, bilateral common femoral vein, the right femoral and right popliteal veins  #2  Balloon venoplasty was performed using a 12 mm balloon  #3  Massive amounts of thrombus were evacuated.  There is still a small amount of residual chronic thrombus within the filter.  #4  If the patient continues to have difficulty with leg swelling, I would consider removing the filter with the angio [facility] device as protection.  Hopefully with the decrease in thrombus burden, he will be significantly better and will not require additional treatment  #5  The patient will continue with anticoagulation.  He has compression wraps on his legs now and will be transition to compression stockings tomorrow once the sutures have been removed from bilateral popliteal veins V. Annamarie Major, M.D., FACS [facility] [place] Office: [phone] Pager:  [phone]     PERTINENT LAB RESULTS: CBC: Recent Labs    11/25/21 0202 11/26/21 0051  WBC 10.0 8.3  HGB 11.3* 9.7*  HCT 33.3* 28.9*  PLT 169 149*   CMET CMP     Component Value Date/Time   NA 135 11/26/2021 0051   K 4.1 11/26/2021 0051   CL 106 11/26/2021 0051   CO2 21 (L) 11/26/2021 0051   GLUCOSE 180 (H) 11/26/2021 0051   BUN 29 (H) 11/26/2021 0051   CREATININE 1.26 (H) 11/26/2021 0051   CALCIUM 8.2 (L) 11/26/2021 0051   GFRNONAA >60 11/26/2021 0051    GFR Estimated Creatinine Clearance: 72.2 mL/min (A) (by C-G formula based on SCr of 1.26 mg/dL (H)). No results for input(s): "LIPASE", "AMYLASE" in the last 72 hours. No results for input(s): "CKTOTAL", "CKMB", "CKMBINDEX", "TROPONINI" in the last 72 hours. Invalid  input(s): "POCBNP" No results for input(s): "DDIMER" in the last 72 hours. No results for input(s): "HGBA1C" in the last 72 hours. Recent Labs    11/25/21 0202  CHOL 177  HDL 43  LDLCALC 114*  TRIG 102  CHOLHDL 4.1   No results for input(s): "TSH", "T4TOTAL", "T3FREE", "THYROIDAB" in the last 72 hours.  Invalid input(s): "FREET3" No results for input(s): "VITAMINB12", "FOLATE", "FERRITIN", "TIBC", "IRON", "RETICCTPCT" in the last 72 hours. Coags: No results for input(s): "INR" in the last 72 hours.  Invalid input(s): "PT" Microbiology: No results found for this or any previous visit (from the past 240 hour(s)).  FURTHER DISCHARGE INSTRUCTIONS:  Get Medicines reviewed and adjusted: Please take all your medications with you for your next visit with your Primary MD  Laboratory/radiological data: Please request your Primary MD to go over all hospital tests and procedure/radiological results at the follow up, please ask your Primary MD to get all Hospital records sent to his/her office.  In some cases, they will be blood work, cultures and biopsy results pending at the time of your discharge. Please request that your primary care M.D. goes through all the records of your hospital data and follows up  on these results.  Also Note the following: If you experience worsening of your admission symptoms, develop shortness of breath, life threatening emergency, suicidal or homicidal thoughts you must seek medical attention immediately by calling [phone] or calling your MD immediately  if symptoms less severe.  You must read complete instructions/literature along with all the possible adverse reactions/side effects for all the Medicines you take and that have been prescribed to you. Take any new Medicines after you have completely understood and accpet all the possible adverse reactions/side effects.   Do not drive when taking Pain medications or sleeping medications (Benzodaizepines)  Do not  take more than prescribed Pain, Sleep and Anxiety Medications. It is not advisable to combine anxiety,sleep and pain medications without talking with your primary care practitioner  Special Instructions: If you have smoked or chewed Tobacco  in the last 2 yrs please stop smoking, stop any regular Alcohol  and or any Recreational drug use.  Wear Seat belts while driving.  Please note: You were cared for by a hospitalist during your hospital stay. Once you are discharged, your primary care physician will handle any further medical issues. Please note that NO REFILLS for any discharge medications will be authorized once you are discharged, as it is imperative that you return to your primary care physician (or establish a relationship with a primary care physician if you do not have one) for your post hospital discharge needs so that they can reassess your need for medications and monitor your lab values.  Total Time spent coordinating discharge including counseling, education and face to face time equals greater than 30 minutes.  SignedOren Binet 11/26/2021 8:50 AM

## 2021-11-26 NOTE — Progress Notes (Signed)
Physical Therapy Treatment Patient Details Name: Nicholas Lam MRN: [id number] DOB: 20-Oct-1955 Today's Date: 11/26/2021   History of Present Illness Pt is a 66 y.o. 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 S/p mechanical thrombectomy, balloon venoplasty, ilio caval venogram R leg venogram 6/13. PMH includes PAF, s/p IVC filter (2009), DM2.    PT Comments    Pt received supine with HOB elevated and agreeable to session with continued focus on dynamic balance with rollator; pt demonstrating ability to ambulate with grossly min guard with min A for head turns in all planes while maintaining ambulation. Pt encouraged to pause or cease talking throughout for noted dyspnea and cues for pacing throughout with good return with HR max 121 bpm during activity. Pt continuing to require cues for hand placement on when coming to standing and sitting. Pt continues to benefit from skilled PT services to progress toward functional mobility goals.    Recommendations for follow up therapy are one component of a multi-disciplinary discharge planning process, led by the attending physician.  Recommendations may be updated based on patient status, additional functional criteria and insurance authorization.  Follow Up Recommendations  Home health PT     Assistance Recommended at Discharge PRN  Patient can return home with the following A little help with bathing/dressing/bathroom;Assistance with cooking/housework   Equipment Recommendations  None recommended by PT    Recommendations for Other Services       Precautions / Restrictions Precautions Precautions: Fall Precaution Comments: watch HR Restrictions Weight Bearing Restrictions: No     Mobility  Bed Mobility Overal bed mobility: Modified Independent             General bed mobility comments: HOB elevated with pt using rails, no assistance needed, extra time.    Transfers Overall transfer level:  Needs assistance Equipment used: Rollator (4 wheels) Transfers: Sit to/from Stand Sit to Stand: Min guard           General transfer comment: Min guard assist for safety coming to stand. Educated pt to push up from bed to stand and reach back to sit for safety, but poor compliance.    Ambulation/Gait Ambulation/Gait assistance: Min guard, Min assist Gait Distance (Feet): 130 Feet Assistive device: Rollator (4 wheels) Gait Pattern/deviations: Step-through pattern, Trunk flexed, Decreased stride length Gait velocity: Decreased     General Gait Details: Slow, mildly unsteady gait with flexed posture No LOB, min guard assist to ambulate backwards, min A to ambulate and forwards with head turns in all planes.   Stairs             Wheelchair Mobility    Modified Rankin (Stroke Patients Only)       Balance Overall balance assessment: Needs assistance Sitting-balance support: Feet supported, No upper extremity supported Sitting balance-Leahy Scale: Fair     Standing balance support: Bilateral upper extremity supported, During functional activity Standing balance-Leahy Scale: Poor Standing balance comment: Reliant on rollator for mobility               High Level Balance Comments: Pt cued toturn head L/R up/down while continuing ambulation with rollator with min A to maintain balance for 50'            Cognition Arousal/Alertness: Awake/alert Behavior During Therapy: WFL for tasks assessed/performed Overall Cognitive Status: Within Functional Limits for tasks assessed  General Comments: WFL for simple tasks; attention limited by tangential conversation requiring intermittent redirection to current topic/task        Exercises Other Exercises Other Exercises: Pt cued toturn head L/R up/down while continuing ambulation with rollator with min A to maintain balance for 50'    General Comments General comments  (skin integrity, edema, etc.): Hr at rest 88bpm, HRmax during ambulation 121bpm      Pertinent Vitals/Pain Pain Assessment Pain Assessment: Faces Faces Pain Scale: Hurts a little bit Pain Location: back Pain Descriptors / Indicators: Discomfort, Sore Pain Intervention(s): Monitored during session, Repositioned    Home Living                          Prior Function            PT Goals (current goals can now be found in the care plan section) Acute Rehab PT Goals Patient Stated Goal: to get better PT Goal Formulation: With patient Time For Goal Achievement: 12/04/21    Frequency    Min 3X/week      PT Plan Current plan remains appropriate    Co-evaluation              AM-PAC PT "6 Clicks" Mobility   Outcome Measure  Help needed turning from your back to your side while in a flat bed without using bedrails?: None Help needed moving from lying on your back to sitting on the side of a flat bed without using bedrails?: None Help needed moving to and from a bed to a chair (including a wheelchair)?: A Little Help needed standing up from a chair using your arms (e.g., wheelchair or bedside chair)?: A Little Help needed to walk in hospital room?: A Little Help needed climbing 3-5 steps with a railing? : A Little 6 Click Score: 20    End of Session Equipment Utilized During Treatment: Gait belt Activity Tolerance: Patient tolerated treatment well Patient left: in chair;with call bell/phone within reach;with family/visitor present Nurse Communication: Mobility status PT Visit Diagnosis: Other abnormalities of gait and mobility (R26.89);Unsteadiness on feet (R26.81)     Time: 1275-1700 PT Time Calculation (min) (ACUTE ONLY): 30 min  Charges:  $Gait Training: 8-22 mins $Therapeutic Activity: 8-22 mins                     Wilfred Siverson R. PTA Acute Rehabilitation Services Office: [place] 11/26/2021, 10:14 AM

## 2021-12-01 ENCOUNTER — Other Ambulatory Visit (HOSPITAL_COMMUNITY): Payer: Self-pay

## 2021-12-01 ENCOUNTER — Ambulatory Visit (INDEPENDENT_AMBULATORY_CARE_PROVIDER_SITE_OTHER): Payer: Medicare Other | Admitting: Student

## 2021-12-01 ENCOUNTER — Other Ambulatory Visit: Payer: Self-pay

## 2021-12-01 ENCOUNTER — Encounter: Payer: Self-pay | Admitting: Student

## 2021-12-01 VITALS — BP 138/88 | HR 98 | Temp 98.0°F | Ht 70.0 in | Wt 242.1 lb

## 2021-12-01 DIAGNOSIS — I480 Paroxysmal atrial fibrillation: Secondary | ICD-10-CM | POA: Diagnosis not present

## 2021-12-01 DIAGNOSIS — I950 Idiopathic hypotension: Secondary | ICD-10-CM

## 2021-12-01 DIAGNOSIS — N201 Calculus of ureter: Secondary | ICD-10-CM

## 2021-12-01 DIAGNOSIS — E1169 Type 2 diabetes mellitus with other specified complication: Secondary | ICD-10-CM | POA: Diagnosis not present

## 2021-12-01 DIAGNOSIS — I82423 Acute embolism and thrombosis of iliac vein, bilateral: Secondary | ICD-10-CM

## 2021-12-01 DIAGNOSIS — R030 Elevated blood-pressure reading, without diagnosis of hypertension: Secondary | ICD-10-CM

## 2021-12-01 NOTE — Patient Instructions (Signed)
Thank you, [name] for allowing [place] to provide your care today. Today we discussed your recent hospital visit.  I am glad that you are doing well. Continue home PT and continue taking your Eliquis as prescribed.  Please make sure to follow-up with vascular surgery next month.  I have ordered the following labs for you:   Lab Orders         BMP8+Anion Gap         CBC no Diff      I will call if any are abnormal. All of your labs can be accessed through "My Chart".  My Chart Access: [email or web address]  Please follow-up in 6 to 8 weeks to establish care after seeing vascular surgery  Please make sure to arrive 15 minutes prior to your next appointment. If you arrive late, you may be asked to reschedule.    We look forward to seeing you next time. Please call our clinic at [phone] if you have any questions or concerns. The best time to call is Monday-Friday from 9am-4pm, but there is someone available 24/7. If after hours or the weekend, call the main hospital number and ask for the Internal Medicine Resident On-Call. If you need medication refills, please notify your pharmacy one week in advance and they will send [place] a request.   Thank you for letting us take part in your care. Wishing you the best!  Lacinda Axon, MD 12/01/2021, 11:50 AM IM Resident, PGY-2 [facility]al[facility] Milian 41:10

## 2021-12-01 NOTE — Progress Notes (Unsigned)
    CC: Hospital follow-up  HPI:  Nicholas Lam is a 66 y.o. male accompanied by his caretaker who presents to clinic today for hospital follow-up after recent hospitalization for DVT. Please see problem based charting for evaluation, assessment and plan.  Past Medical History:  Diagnosis Date   History of DVT (deep vein thrombosis)    Paroxysmal atrial fibrillation [facility])     Social history: Patient moved from [place] to [place] about 3 years ago.  Does not have a PCP here in [place]. Uses a rollator walker to ambulate. Lives by himself but has a caretaker who comes to his house but 2 days a week for about 4 hours to help with ADLs.  He denies any alcohol, tobacco or illicit drug use.  Review of Systems:  Constitutional: Negative for fever or fatigue Eyes: Negative for visual changes Respiratory: Negative for shortness of breath or dyspnea on exertion Cardiac: Negative for chest pain or palpitations. MSK: Positive for leg swelling and occasional right hip pain Abdomen: Negative for abdominal pain, constipation or diarrhea Neuro: Negative for headache, dizziness or weakness  Physical Exam: General: Pleasant, well-appearing elderly man in wheelchair. No acute distress. 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 No murmurs, rubs or gallops.  Respiratory: Lungs CTAB.  Decreased air movement.  No wheezing or crackles. Abdominal: Soft, symmetric and non tender. Normal BS. Skin: Warm, dry and intact without rashes or lesions Extremities: Atraumatic. Full ROM. Nontender calf muscles. Mild BLE edema R>L.  Neuro: A&O x 3. Moves all extremities.  Normal sensation to gross touch Psych: Appropriate mood and affect.  Vitals:   12/01/21 1112 12/01/21 1121  BP: (!) 134/95 138/88  Pulse: 98 98  Temp: 98 F (36.7 C)   TempSrc: Oral   SpO2: 99%   Weight: 242 lb 1.6 oz (109.8 kg)   Height: '5\' 10"'$  (1.778 m)     Assessment & Plan:   See  Encounters Tab for problem based charting.  Patient discussed with Dr.  Thomes Cake, MD, MPH

## 2021-12-02 ENCOUNTER — Telehealth: Payer: Self-pay | Admitting: *Deleted

## 2021-12-02 ENCOUNTER — Encounter: Payer: Self-pay | Admitting: Student

## 2021-12-02 LAB — CBC
Hematocrit: 38.1 % (ref 37.5–51.0)
Hemoglobin: 13.1 g/dL (ref 13.0–17.7)
MCH: 30.4 pg (ref 26.6–33.0)
MCHC: 34.4 g/dL (ref 31.5–35.7)
MCV: 88 fL (ref 79–97)
Platelets: 257 10*3/uL (ref 150–450)
RBC: 4.31 x10E6/uL (ref 4.14–5.80)
RDW: 13.4 % (ref 11.6–15.4)
WBC: 9.1 10*3/uL (ref 3.4–10.8)

## 2021-12-02 LAB — BMP8+ANION GAP
Anion Gap: 16 mmol/L (ref 10.0–18.0)
BUN/Creatinine Ratio: 15 (ref 10–24)
BUN: 15 mg/dL (ref 8–27)
CO2: 18 mmol/L — ABNORMAL LOW (ref 20–29)
Calcium: 8.6 mg/dL (ref 8.6–10.2)
Chloride: 101 mmol/L (ref 96–106)
Creatinine, Ser: 0.98 mg/dL (ref 0.76–1.27)
Glucose: 174 mg/dL — ABNORMAL HIGH (ref 70–99)
Potassium: 4.5 mmol/L (ref 3.5–5.2)
Sodium: 135 mmol/L (ref 134–144)
eGFR: 85 mL/min/{1.73_m2} (ref >59)

## 2021-12-02 NOTE — Assessment & Plan Note (Addendum)
Patient with a history of IVC filter placed in 2009 recently hospitalized for right lower extremity DVT from 6/7-6/15.  Patient presented with significant right lower extremity swelling and found to have extensive DVT involving multiple veins on Doppler. A CT venogram confirmed DVT involving IVC, bilateral iliac veins and neck, femoral vein as well as moderate to severe obstructive uropathy with 1.5 cm calculus at the UPJ. Vascular surgery was consulted and patient received thrombectomy on 6/13. Patient was transition of IV heparin to Eliquis and discharged on 6/15.  Patient instructed to continue Eliquis and follow-up with vascular surgery in the outpatient for further management of his IVC filter. Patient presents today for hospital follow-up. States that he is doing well. He does endorse some bilateral hip pain but denies any shortness of breath, chest pain or palpitations. Right leg swelling has improved.  CBC shows improvement in hemoglobin from 9.7-13.1. BMP showed normal kidney function and electrolytes.  Plan: -Continue Eliquis starter pack, take 10 mg (2 tablets) twice daily for 7 days starting on 6/14, and then 5 mg twice daily. -Continue home PT -Scheduled to follow-up with vascular surgery on 7/31

## 2021-12-02 NOTE — Telephone Encounter (Signed)
I approve the above recommendations.

## 2021-12-02 NOTE — Assessment & Plan Note (Addendum)
Patient found to have an A1c of 8.2% during a recent hospitalization. Patient states he was previously on metformin but stopped taking it. Patient refused subcu insulin during hospitalization. Discussed with patient about his diabetes and he stated he does not want to start any treatment right now.  Instructed patient to check his blood sugar at home and bring his meter to his next office visit. -Revisit diabetes management at next office visit -Repeat A1c in 3 months

## 2021-12-02 NOTE — Telephone Encounter (Signed)
Call from [place] with [facility] requesting verbal orders for "PTo nce a week x 1week; twice a week x 2 weeks; then once a week x 2 weeks for gait training, strengthening, balance ,safety and education". VO given - routed to MD for approval or denial. Thanks

## 2021-12-02 NOTE — Assessment & Plan Note (Signed)
Imaging during recent hospitalization showed 1.5 cm calculus at the UPJ with moderate to severe hydronephrosis.  Urology evaluated patient and thought this was chronic. Patient remained asymptomatic so plan was for observation and outpatient follow-up with urology. Plan for eventual treatment with be ESWL or possible ureteroscope. Patient denies any urinary symptoms.  Plan: -[facility]erral to [facility]

## 2021-12-02 NOTE — Assessment & Plan Note (Signed)
Patient remains in normal sinus with heart rate in the high 90s. 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 We will continue to monitor closely. -Continue Eliquis

## 2021-12-03 ENCOUNTER — Encounter: Payer: Medicare Other | Admitting: Internal Medicine

## 2021-12-04 ENCOUNTER — Telehealth: Payer: Self-pay

## 2021-12-04 NOTE — Telephone Encounter (Signed)
Pt called stating that he was given some thigh-high compression stockings and needed some guidance on them.  Reviewed pt's chart, returned pt's call, two identifiers used. Instructed pt to wear them as much as possible during the day, and if he wanted to wear them during the night, he could. He was given some helpful tips on how to put them on since he was struggling with this. Informed him that if he needed an additional pair, he could come to our office. Pt confirmed understanding.

## 2021-12-08 ENCOUNTER — Other Ambulatory Visit: Payer: Self-pay | Admitting: *Deleted

## 2021-12-08 DIAGNOSIS — Z95828 Presence of other vascular implants and grafts: Secondary | ICD-10-CM

## 2021-12-10 ENCOUNTER — Telehealth (HOSPITAL_BASED_OUTPATIENT_CLINIC_OR_DEPARTMENT_OTHER): Payer: Self-pay

## 2021-12-10 ENCOUNTER — Other Ambulatory Visit (HOSPITAL_COMMUNITY): Payer: Self-pay

## 2021-12-10 NOTE — Telephone Encounter (Signed)
Pharmacy Transitions of Care Follow-up Telephone Call  Date of discharge: 11/26/21  Discharge Diagnosis: DVT  How have you been since you were released from the hospital? Patient has been doing well since leaving the hospital and PT has been helping him gain some mobility back. No side effects so far from his Eliquis and it has already been reordered from [facility]. He is having trouble putting on his compression socks, but is getting a tool today that should help him put them on easier.   Medication changes made at discharge: START taking these medications  START taking these medications  Apixaban Starter Pack ('10mg'$  and '5mg'$ ) Commonly known as: ELIQUIS STARTER PACK Take 10 mg (2 tablets) twice daily for 7 days starting on 6/14, and then 5 mg twice daily.   CONTINUE taking these medications  CONTINUE taking these medications  ONE-A-DAY MENS 50+ PO  OVER THE COUNTER MEDICATION  Vitamin D3 250 MCG (10000 UT) capsule   STOP taking these medications  STOP taking these medications  ibuprofen 200 MG tablet Commonly known as: ADVIL    Medication changes verified by the patient? yes    Medication Accessibility:  [place] Pharm[place]   Was the patient provided with refills on discharged medications? yes   Have all prescriptions been transferred from [facility] to home pharmacy? Already transferred      Medication Review:   APIXABAN (ELIQUIS)  Apixaban 10 mg BID initiated on 11/26/21. Will switch to apixaban 5 mg BID after 7 days (DATE 12/03/21).  - Discussed importance of taking medication around the same time everyday  - Reviewed potential DDIs with patient  - Advised patient of medications to avoid (NSAIDs, ASA)  - Educated that Tylenol (acetaminophen) will be the preferred analgesic to prevent risk of bleeding  - Emphasized importance of monitoring for signs and symptoms of bleeding (abnormal bruising, prolonged bleeding, nose bleeds, bleeding from gums, discolored urine,  black tarry stools)  - Advised patient to alert all providers of anticoagulation therapy prior to starting a new medication or having a procedure   Follow-up Appointments: F/U 06/20 with internal medicine Dr. Coy Saunas  If their condition worsens, is the pt aware to call PCP or go to the Emergency Dept.? yes  Final Patient Assessment: Patient has been doing well since leaving the hospital and PT has been helping him gain some mobility back. No side effects so far from his Eliquis and it has already been reordered from [facility]. He is having trouble putting on his compression socks, but is getting a tool today that should help him put them on easier.   Nicholas Lam, [facility] 12/10/2021 10:50 AM

## 2022-01-11 ENCOUNTER — Ambulatory Visit (HOSPITAL_COMMUNITY)
Admission: RE | Admit: 2022-01-11 | Discharge: 2022-01-11 | Disposition: A | Discharge status: Home / Self Care | Payer: Medicare Other | Source: Ambulatory Visit | Attending: Vascular Surgery | Admitting: Vascular Surgery

## 2022-01-11 DIAGNOSIS — Z95828 Presence of other vascular implants and grafts: Secondary | ICD-10-CM | POA: Insufficient documentation

## 2022-02-07 NOTE — Progress Notes (Unsigned)
[facility] [place] HISTORY AND PHYSICAL   History of Present Illness:  Patient is a 66 y.o. year old male who presents for evaluation right lower extremity edema.  He does have a history of a IVC filter placed in 2009.  According to the patient, he never had a DVT, but feels like he is at high risk.  The filter was placed for preventative measures.  It was initially placed to be taken out however the patient decided that he wanted to leave it in.  The patient has a history of A-fib.  He spontaneously converted, and so his Xarelto was discontinued by cardiology.   The CT angiogram demonstrated an occluded IVC filter. He was taken to the OR by DR. Brabham on 11/24/21 for  Mechanical thrombectomy:  Inferior vena cava, bilateral common iliac vein, bilateral external iliac vein, bilateral common femoral vein, right femoral vein, right popliteal vein and  Balloon venoplasty:  bilateral common iliac vein, bilateral external iliac vein, bilateral common femoral vein, right femoral vein, right popliteal vein.   Past Medical History:  Diagnosis Date   History of DVT (deep vein thrombosis)    Paroxysmal atrial fibrillation [facility])     Past Surgical History:  Procedure Laterality Date   INTRAVASCULAR ULTRASOUND/IVUS Bilateral 11/24/2021   Procedure: Intravascular Ultrasound/IVUS;  Surgeon: Serafina Mitchell, MD;  Location: [place] [facility];  Service: Cardiovascular;  Laterality: Bilateral;  IVC BIL LOWER EXT   LOWER EXTREMITY VENOGRAPHY Bilateral 11/24/2021   Procedure: LOWER EXTREMITY VENOGRAPHY;  Surgeon: Serafina Mitchell, MD;  Location: [place] CV LAB;  Service: Cardiovascular;  Laterality: Bilateral;   PERIPHERAL VASCULAR BALLOON ANGIOPLASTY Bilateral 11/24/2021   Procedure: PERIPHERAL VASCULAR BALLOON ANGIOPLASTY;  Surgeon: Serafina Mitchell, MD;  Location: [place] CV LAB;  Service: Cardiovascular;  Laterality: Bilateral;   PERIPHERAL VASCULAR THROMBECTOMY Bilateral 11/24/2021    Procedure: PERIPHERAL VASCULAR THROMBECTOMY;  Surgeon: Serafina Mitchell, MD;  Location: [place] CV LAB;  Service: Cardiovascular;  Laterality: Bilateral;  LOWER EXT    ROS:   General:  No weight loss, Fever, chills  HEENT: No recent headaches, no nasal bleeding, no visual changes, no sore throat  Neurologic: No dizziness, blackouts, seizures. No recent symptoms of stroke or mini- stroke. No recent episodes of slurred speech, or temporary blindness.  Cardiac: No recent episodes of chest pain/pressure, no shortness of breath at rest.  No shortness of breath with exertion.  Denies history of atrial fibrillation or irregular heartbeat  Vascular: No history of rest pain in feet.  No history of claudication.  No history of non-healing ulcer, No history of DVT   Pulmonary: No home oxygen, no productive cough, no hemoptysis,  No asthma or wheezing  Musculoskeletal:  '[ ]'$  Arthritis, '[ ]'$  Low back pain,  '[ ]'$  Joint pain  Hematologic:No history of hypercoagulable state.  No history of easy bleeding.  No history of anemia  Gastrointestinal: No hematochezia or melena,  No gastroesophageal reflux, no trouble swallowing  Urinary: '[ ]'$  chronic Kidney disease, '[ ]'$  on HD - '[ ]'$  MWF or '[ ]'$  TTHS, '[ ]'$  Burning with urination, '[ ]'$  Frequent urination, '[ ]'$  Difficulty urinating;   Skin: No rashes  Psychological: No history of anxiety,  No history of depression  Social History Social History   Tobacco Use   Smoking status: Never   Smokeless tobacco: Never  Substance Use Topics   Alcohol use: Never   Drug use: Never    Family History  No family history on file.  Allergies  Allergies  Allergen Reactions   Iodinated Contrast Media Hives, Rash and Swelling   Iodine      Current Outpatient Medications  Medication Sig Dispense Refill   APIXABAN (ELIQUIS) VTE STARTER PACK ('10MG'$  AND '5MG'$ ) Take 10 mg (2 tablets) twice daily for 7 days starting on 6/14, and then 5 mg twice daily. 90 tablet 3    Cholecalciferol (VITAMIN D3) 250 MCG (10000 UT) capsule Take 10,000 Units by mouth daily.     Multiple Vitamins-Minerals (ONE-A-DAY MENS 50+ PO) Take 1 tablet by mouth daily.     OVER THE COUNTER MEDICATION Place 2 drops into both eyes 2 (two) times daily as needed (blurry vision). Saline eye drops     No current facility-administered medications for this visit.    Physical Examination  There were no vitals filed for this visit.  There is no height or weight on file to calculate BMI.  General:  Alert and oriented, no acute distress HEENT: Normal Neck: No bruit or JVD Pulmonary: Clear to auscultation bilaterally Cardiac: Regular Rate and Rhythm without murmur Abdomen: Soft, non-tender, non-distended, no mass, no scars Skin: No rash Extremity Pulses:  2+ radial, brachial, femoral, dorsalis pedis, posterior tibial pulses bilaterally Musculoskeletal: No deformity or edema  Neurologic: Upper and lower extremity motor 5/5 and symmetric  DATA: ***  01/11/22    IVC/Iliac Findings:  +----------+------+--------+--------+     IVC    PatentThrombusComments  +----------+------+--------+--------+  IVC Prox  patent                  +----------+------+--------+--------+  IVC Mid   patent                  +----------+------+--------+--------+  IVC Distalpatent                  +----------+------+--------+--------+      +-------------------+---------+-----------+---------+-----------+--------+          CIV        RT-PatentRT-ThrombusLT-PatentLT-ThrombusComments  +-------------------+---------+-----------+---------+-----------+--------+  Common Iliac Prox   patent              patent                       +-------------------+---------+-----------+---------+-----------+--------+  Common Iliac Mid    patent              patent                       +-------------------+---------+-----------+---------+-----------+--------+  Common Iliac Distal  patent              patent                       +-------------------+---------+-----------+---------+-----------+--------+       +-------------------------+---------+-----------+---------+-----------+----  ----+             EIV            RT-PatentRT-ThrombusLT-PatentLT-ThrombusComments  +-------------------------+---------+-----------+---------+-----------+----  ----+  External Iliac Vein Prox  patent              patent                         +-------------------------+---------+-----------+---------+-----------+----  ----+  External Iliac Vein Mid   patent              patent                         +-------------------------+---------+-----------+---------+-----------+----  ----+  External Iliac Vein       patent              patent                         Distal                                                                       +-------------------------+---------+-----------+---------+-----------+----  ----+     Summary:  There is no visualized thrombus involving the IVC or iliac veins, limited  visualization. Further imaging modality may be warranted. ASSESSMENT: ***   PLAN: ***   Roxy Horseman PA-C [facility] [place] Office: [phone]  MD in office

## 2022-02-08 ENCOUNTER — Ambulatory Visit (INDEPENDENT_AMBULATORY_CARE_PROVIDER_SITE_OTHER): Payer: Medicare Other | Admitting: Physician Assistant

## 2022-02-08 ENCOUNTER — Encounter: Payer: Self-pay | Admitting: Surgery

## 2022-02-08 VITALS — BP 149/93 | HR 103 | Temp 97.0°F | Resp 18 | Ht 70.0 in | Wt 234.0 lb

## 2022-02-08 DIAGNOSIS — Z95828 Presence of other vascular implants and grafts: Secondary | ICD-10-CM

## 2022-07-08 ENCOUNTER — Other Ambulatory Visit: Payer: Self-pay

## 2022-07-08 ENCOUNTER — Ambulatory Visit (INDEPENDENT_AMBULATORY_CARE_PROVIDER_SITE_OTHER): Payer: Medicare Other | Admitting: Student

## 2022-07-08 ENCOUNTER — Encounter: Payer: Self-pay | Admitting: Student

## 2022-07-08 VITALS — BP 136/67 | HR 76 | Temp 97.7°F | Ht 70.0 in | Wt 235.1 lb

## 2022-07-08 DIAGNOSIS — N201 Calculus of ureter: Secondary | ICD-10-CM | POA: Diagnosis present

## 2022-07-08 DIAGNOSIS — M869 Osteomyelitis, unspecified: Secondary | ICD-10-CM | POA: Diagnosis not present

## 2022-07-08 DIAGNOSIS — Z86718 Personal history of other venous thrombosis and embolism: Secondary | ICD-10-CM

## 2022-07-08 DIAGNOSIS — E1169 Type 2 diabetes mellitus with other specified complication: Secondary | ICD-10-CM | POA: Diagnosis not present

## 2022-07-08 DIAGNOSIS — M1611 Unilateral primary osteoarthritis, right hip: Secondary | ICD-10-CM

## 2022-07-08 NOTE — Patient Instructions (Signed)
Thank you, [name] for allowing [place] to provide your care today. Today we discussed your recurrent UTIs, IVC filter, blood pressure, right hip osteoarthritis and recent lab work.    For history of clots, we highly recommend continuing anticoagulation to decrease her risk of further clots.  I have place a referrals to urology and orthopedic surgery  My Chart Access: [email or web address]  Please follow-up in 3 months  Please make sure to arrive 15 minutes prior to your next appointment. If you arrive late, you may be asked to reschedule.    We look forward to seeing you next time. Please call our clinic at [phone] if you have any questions or concerns. The best time to call is Monday-Friday from 9am-4pm, but there is someone available 24/7. If after hours or the weekend, call the main hospital number and ask for the Internal Medicine Resident On-Call. If you need medication refills, please notify your pharmacy one week in advance and they will send [place] a request.   Thank you for letting us take part in your care. Wishing you the best!  Lacinda Axon, MD 07/08/2022, 3:04 PM IM Resident, PGY-3 Oswaldo Milian 41:10

## 2022-07-08 NOTE — Progress Notes (Signed)
CC: Recurrent UTIs  HPI:  Nicholas Lam is a 67 y.o. male with PMH as below who presents to clinic accompanied by a caretaker for referral to orthopedic surgery and urology. Please see problem based charting for evaluation, assessment and plan.  Past Medical History:  Diagnosis Date   History of DVT (deep vein thrombosis)    Left retinal detachment    Paroxysmal atrial fibrillation (HCC)    Type 2 diabetes mellitus ([place])    Ureteral stone with hydronephrosis    Review of Systems:  Constitutional: Negative for fever or fatigue Eyes: Negative for visual changes Respiratory: Negative for shortness of breath Cardiac: Negative for chest pain MSK: Positive for chronic hip and back pain. Extremities: Positive for mild leg swelling.  Negative for leg pain. GU: Positive for nocturia Neuro: Negative for headache or weakness  Physical Exam: General: Pleasant, well-appearing elderly man. No acute distress. 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 Cardiac: RRR. No murmurs, rubs or gallops. No LE edema Respiratory: Lungs CTAB. No wheezing or crackles. Abdominal: Soft, symmetric and non tender. Normal BS. Skin: Warm, dry and intact without rashes or lesions Extremities:  Varicose veins with chronic calf enlargement from multiple DVTs. Trace BLE edema. Nontender calves. Bilateral DP pulses 1+ and symmetric. Neuro: A&O x 3. Moves all extremities. Normal sensation to gross touch. Psych: Appropriate mood and affect.  Vitals:   07/08/22 1421 07/08/22 1457  BP: (!) 142/89 136/67  Pulse: 87 76  Temp: 97.7 F (36.5 C)   TempSrc: Oral   SpO2: 93%   Weight: 235 lb 1.6 oz (106.6 kg)   Height: '5\' 10"'$  (1.778 m)     Assessment & Plan:   Ureteral stone Patient with a history of kidney stones years ago while living in [place]. He reports that during procedure to remove the stone, his ureter was punctured requiring brief management with nephrostomy tube. He has had recurrent  nocturia and UTIs since then. He was last treated for UTI last November with ciprofloxacin. Patient was recently seen at the primary care office and [facility] and a UA showed mild leuks and hematuria but no nitrites.  During his last hospitalization patient was found to have a 1.5 cm calculi at the UPJ with moderate to severe hydronephrosis. Patient referred to urology however, he was unable to make the appointment in July. Patient interested in establishing with urology here in [place].  Plan: -[facility]  Type 2 diabetes mellitus ([place]) Labs from [place] primary care on 1/9 showed A1c slightly improved 8.2% to 7.8%.  Patient still not interested in starting medication for his diabetes. He will continue to work on lifestyle modifications. -Repeat A1c in 3 months -Continue lifestyle modifications with dietary changes and exercise  History of recurrent deep vein thrombosis (DVT) Patient followed by vascular surgery. Informs me that he stopped taking his Eliquis around November 2023 due to concern about possibly bleed. Patient informed that he is at a very high risk of developing another DVT of of anticoagulation as his IVC filter, which was placed 15 years ago, is likely thrombogenic. Patient has chronic calf swelling from his recurrent DVTs but denies any calf tenderness. He denies any bleeding, shortness of breath, palpitations or chest pain. Advised patient to resume Eliquis and follow-up with vascular surgery.   Plan: -Resume Eliquis 5 mg twice daily -Follow-up with vascular surgery as needed  Osteoarthritis of right hip Patient reports history of right hip osteoarthritis that was being followed by  orthopedic surgery in [place].  They held off on surgery due to his other comorbidities and until patient has failed nonsurgical options. Patient interested in referral to orthopedic surgery here in [facility] for reevaluation. He currently ambulates with a rollator  walker. -Referral to orthopedic surgery  See Encounters Tab for problem based charting.  Patient discussed with Dr. Lockie Pares, MD, MPH

## 2022-07-09 ENCOUNTER — Ambulatory Visit (INDEPENDENT_AMBULATORY_CARE_PROVIDER_SITE_OTHER): Payer: Medicare Other

## 2022-07-09 VITALS — BP 142/89 | HR 87 | Temp 97.7°F | Ht 70.0 in | Wt 235.1 lb

## 2022-07-09 DIAGNOSIS — Z0000 Encounter for general adult medical examination without abnormal findings: Secondary | ICD-10-CM

## 2022-07-10 ENCOUNTER — Encounter: Payer: Self-pay | Admitting: Student

## 2022-07-10 DIAGNOSIS — M1611 Unilateral primary osteoarthritis, right hip: Secondary | ICD-10-CM | POA: Insufficient documentation

## 2022-07-10 HISTORY — DX: Unilateral primary osteoarthritis, right hip: M16.11

## 2022-07-10 MED ORDER — APIXABAN 5 MG PO TABS: 5.0000 mg | ORAL_TABLET | Freq: Two times a day (BID) | ORAL | 5 refills | Status: DC

## 2022-07-10 NOTE — Assessment & Plan Note (Signed)
Patient with a history of kidney stones years ago while living in [place]. He reports that during procedure to remove the stone, his ureter was punctured requiring brief management with nephrostomy tube. He has had recurrent nocturia and UTIs since then. He was last treated for UTI last November with ciprofloxacin. Patient was recently seen at the primary care office and [facility] and a UA showed mild leuks and hematuria but no nitrites.  During his last hospitalization patient was found to have a 1.5 cm calculi at the UPJ with moderate to severe hydronephrosis. Patient referred to urology however, he was unable to make the appointment in July. Patient interested in establishing with urology here in [place].  Plan: -Refer back to [facility] urology

## 2022-07-10 NOTE — Assessment & Plan Note (Signed)
Patient reports history of right hip osteoarthritis that was being followed by orthopedic surgery in [place].  They held off on surgery due to his other comorbidities and until patient has failed nonsurgical options. Patient interested in referral to orthopedic surgery here in [facility] for reevaluation. He currently ambulates with a rollator walker. -Referral to orthopedic surgery

## 2022-07-10 NOTE — Assessment & Plan Note (Signed)
Labs from [place] primary care on 1/9 showed A1c slightly improved 8.2% to 7.8%.  Patient still not interested in starting medication for his diabetes. He will continue to work on lifestyle modifications. -Repeat A1c in 3 months -Continue lifestyle modifications with dietary changes and exercise

## 2022-07-10 NOTE — Assessment & Plan Note (Signed)
Patient followed by vascular surgery. Informs me that he stopped taking his Eliquis around November 2023 due to concern about possibly bleed. Patient informed that he is at a very high risk of developing another DVT of of anticoagulation as his IVC filter, which was placed 15 years ago, is likely thrombogenic. Patient has chronic calf swelling from his recurrent DVTs but denies any calf tenderness. He denies any bleeding, shortness of breath, palpitations or chest pain. Advised patient to resume Eliquis and follow-up with vascular surgery.   Plan: -Resume Eliquis 5 mg twice daily -Follow-up with vascular surgery as needed

## 2022-07-12 NOTE — Progress Notes (Signed)
Internal Medicine Clinic Attending  Case discussed with Dr. Amponsah  At the time of the visit.  We reviewed the resident's history and exam and pertinent patient test results.  I agree with the assessment, diagnosis, and plan of care documented in the resident's note.  

## 2022-07-12 NOTE — Progress Notes (Signed)
Subjective:   Nicholas Lam is a 67 y.o. male who presents for an Initial Medicare Annual Wellness Visit. I connected with  Nicholas Lam on 07/12/22 by a  Face-To-Face  enabled telemedicine application and verified that I am speaking with the correct person using two identifiers.  Patient Location: Other:  Office/Clinic  Provider Location: Office/Clinic  I discussed the limitations of evaluation and management by telemedicine. The patient expressed understanding and agreed to proceed.  Review of Systems    Defer to PCP       Objective:    Today's Vitals   07/12/22 1209 07/12/22 1210  BP: (!) 142/89   Pulse: 87   Temp: 97.7 F (36.5 C)   TempSrc: Oral   SpO2: 93%   Weight: 235 lb 1.6 oz (106.6 kg)   Height: '5\' 10"'$  (1.778 m)   PainSc:  5    Body mass index is 33.73 kg/m.     07/12/2022   12:11 PM 07/08/2022    2:12 PM 12/01/2021   11:20 AM 11/22/2021    9:00 PM  Advanced Directives  Does Patient Have a Medical Advance Directive? No No No No  Would patient like information on creating a medical advance directive? No - Patient declined No - Patient declined Yes (ED - Information included in AVS) No - Patient declined    Current Medications (verified) Outpatient Encounter Medications as of 07/09/2022  Medication Sig   apixaban (ELIQUIS) 5 MG TABS tablet Take 1 tablet (5 mg total) by mouth 2 (two) times daily.   Multiple Vitamins-Minerals (ONE-A-DAY MENS 50+ PO) Take 1 tablet by mouth daily.   [DISCONTINUED] APIXABAN (ELIQUIS) VTE STARTER PACK ('10MG'$  AND '5MG'$ ) Take 10 mg (2 tablets) twice daily for 7 days starting on 6/14, and then 5 mg twice daily.   [DISCONTINUED] Cholecalciferol (VITAMIN D3) 250 MCG (10000 UT) capsule Take 10,000 Units by mouth daily.   [DISCONTINUED] influenza vac split quadrivalent PF (FLUARIX) 0.5 ML injection    [DISCONTINUED] OVER THE COUNTER MEDICATION Place 2 drops into both eyes 2 (two) times daily as needed (blurry vision). Saline  eye drops   No facility-administered encounter medications on file as of 07/09/2022.    Allergies (verified) Iodinated contrast media and Iodine   History: Past Medical History:  Diagnosis Date   History of DVT (deep vein thrombosis)    Left retinal detachment    Paroxysmal atrial fibrillation (HCC)    Type 2 diabetes mellitus ([place])    Ureteral stone with hydronephrosis    Past Surgical History:  Procedure Laterality Date   INTRAVASCULAR ULTRASOUND/IVUS Bilateral 11/24/2021   Procedure: Intravascular Ultrasound/IVUS;  Surgeon: Serafina Mitchell, MD;  Location: [facility];  Service: Cardiovascular;  Laterality: Bilateral;  IVC BIL LOWER EXT   LOWER EXTREMITY VENOGRAPHY Bilateral 11/24/2021   Procedure: LOWER EXTREMITY VENOGRAPHY;  Surgeon: Serafina Mitchell, MD;  Location: [facility];  Service: Cardiovascular;  Laterality: Bilateral;   PERIPHERAL VASCULAR BALLOON ANGIOPLASTY Bilateral 11/24/2021   Procedure: PERIPHERAL VASCULAR BALLOON ANGIOPLASTY;  Surgeon: Serafina Mitchell, MD;  Location: [facility];  Service: Cardiovascular;  Laterality: Bilateral;   PERIPHERAL VASCULAR THROMBECTOMY Bilateral 11/24/2021   Procedure: PERIPHERAL VASCULAR THROMBECTOMY;  Surgeon: Serafina Mitchell, MD;  Location: [facility];  Service: Cardiovascular;  Laterality: Bilateral;  LOWER EXT   History reviewed. No pertinent family history. Social History   Socioeconomic History   Marital status: Single    Spouse name: Not on  file   Number of children: Not on file   Years of education: Not on file   Highest education level: Not on file  Occupational History   Not on file  Tobacco Use   Smoking status: Never   Smokeless tobacco: Never  Substance and Sexual Activity   Alcohol use: Never   Drug use: Never   Sexual activity: Not on file  Other Topics Concern   Not on file  Social History Narrative   Not on file   Social Determinants of Health   Financial Resource Strain:  Low Risk  (07/12/2022)   Overall Financial Resource Strain (CARDIA)    Difficulty of Paying Living Expenses: Not hard at all  Food Insecurity: No Food Insecurity (07/12/2022)   Hunger Vital Sign    Worried About Running Out of Food in the Last Year: Never true    Ran Out of Food in the Last Year: Never true  Transportation Needs: No Transportation Needs (07/12/2022)   PRAPARE - Hydrologist (Medical): No    Lack of Transportation (Non-Medical): No  Physical Activity: Inactive (07/12/2022)   Exercise Vital Sign    Days of Exercise per Week: 0 days    Minutes of Exercise per Session: 0 min  Stress: No Stress Concern Present (07/12/2022)   [place]    Feeling of Stress : Not at all  Social Connections: Moderately Isolated (07/12/2022)   Social Connection and Isolation Panel [NHANES]    Frequency of Communication with Friends and Family: More than three times a week    Frequency of Social Gatherings with Friends and Family: Once a week    Attends Religious Services: Never    Marine scientist or Organizations: No    Attends Music therapist: 1 to 4 times per year    Marital Status: Divorced    Tobacco Counseling Counseling given: Not Answered   Clinical Intake:  Pre-visit preparation completed: Yes  Pain : 0-10 Pain Score: 5  Pain Type: Chronic pain Pain Location: Hip Pain Orientation: Right Pain Descriptors / Indicators: Constant, Sharp Pain Onset: More than a month ago Pain Frequency: Constant Pain Relieving Factors: otc meds.  Pain Relieving Factors: otc meds.  Nutritional Risks: None Diabetes: Yes  How often do you need to have someone help you when you read instructions, pamphlets, or other written materials from your doctor or pharmacy?: 1 - Never What is the last grade level you completed in school?: 16 years  Diabetic?Yes  Interpreter Needed?:  No  Information entered by :: Nicholas Lam,cma 07/12/22 12:11am   Activities of Daily Living    07/12/2022   12:12 PM 07/08/2022    2:18 PM  In your present state of health, do you have any difficulty performing the following activities:  Hearing? 1 1  Vision? 0 0  Difficulty concentrating or making decisions? 0 0  Walking or climbing stairs? 1 1  Dressing or bathing? 0 0  Doing errands, shopping? 1 1    Patient Care Team: Lacinda Axon, MD as PCP - General (Internal Medicine)  Indicate any recent Medical Services you may have received from other than [facility] providers in the past year (date may be approximate).     Assessment:   This is a routine wellness examination for Nicholas Lam.  Hearing/Vision screen No results found.  Dietary issues and exercise activities discussed:     Goals Addressed   None  Depression Screen    07/12/2022   12:11 PM 07/08/2022    2:14 PM 12/01/2021   11:19 AM  PHQ 2/9 Scores  PHQ - 2 Score 2 2 0  PHQ- 9 Score 7 7     Fall Risk    07/12/2022   12:11 PM 07/08/2022    2:13 PM 12/01/2021   11:18 AM  Fall Risk   Falls in the past year? '1 1 1  '$ Number falls in past yr: 1 1 0  Injury with Fall? '1 1 1  '$ Risk for fall due to : Impaired balance/gait Impaired balance/gait Other (Comment)  Follow up Falls evaluation completed;Falls prevention discussed Falls evaluation completed Falls evaluation completed;Falls prevention discussed    FALL RISK PREVENTION PERTAINING TO THE HOME:  Any stairs in or around the home? Yes  If so, are there any without handrails? No  Home free of loose throw rugs in walkways, pet beds, electrical cords, etc? Yes  Adequate lighting in your home to reduce risk of falls? Yes   ASSISTIVE DEVICES UTILIZED TO PREVENT FALLS:  Life alert? No  Use of a cane, walker or w/c? Yes  Grab bars in the bathroom? No  Shower chair or bench in shower? No  Elevated toilet seat or a handicapped toilet? Yes   TIMED UP AND  GO:  Was the test performed? Yes .  Length of time to ambulate 10 feet: 1 min.   Gait unsteady with use of assistive device, provider informed and education provided.   Cognitive Function:        07/12/2022   12:12 PM  6CIT Screen  What Year? 0 points  What month? 0 points  What time? 0 points  Count back from 20 0 points  Months in reverse 0 points  Repeat phrase 0 points  Total Score 0 points    Immunizations Immunization History  Administered Date(s) Administered   Janssen (J&J) SARS-COV-2 Vaccination 10/13/2019    TDAP status: Due, Education has been provided regarding the importance of this vaccine. Advised may receive this vaccine at local pharmacy or Health Dept. Aware to provide a copy of the vaccination record if obtained from local pharmacy or Health Dept. Verbalized acceptance and understanding.  Flu Vaccine status: Due, Education has been provided regarding the importance of this vaccine. Advised may receive this vaccine at local pharmacy or Health Dept. Aware to provide a copy of the vaccination record if obtained from local pharmacy or Health Dept. Verbalized acceptance and understanding.  Pneumococcal vaccine status: Due, Education has been provided regarding the importance of this vaccine. Advised may receive this vaccine at local pharmacy or Health Dept. Aware to provide a copy of the vaccination record if obtained from local pharmacy or Health Dept. Verbalized acceptance and understanding.  Covid-19 vaccine status: Information provided on how to obtain vaccines.   Qualifies for Shingles Vaccine? No   Zostavax completed No   Shingrix Completed?: No.    Education has been provided regarding the importance of this vaccine. Patient has been advised to call insurance company to determine out of pocket expense if they have not yet received this vaccine. Advised may also receive vaccine at local pharmacy or Health Dept. Verbalized acceptance and  understanding.  Screening Tests Health Maintenance  Topic Date Due   OPHTHALMOLOGY EXAM  Never done   Diabetic kidney evaluation - Urine ACR  Never done   Hepatitis C Screening  Never done   DTaP/Tdap/Td (1 - Tdap) Never done  COLONOSCOPY (Pts 45-46yr Insurance coverage will need to be confirmed)  Never done   Zoster Vaccines- Shingrix (1 of 2) Never done   Pneumonia Vaccine 69 Years old (1 - PCV) Never done   FOOT EXAM  02/04/2021   INFLUENZA VACCINE  Never done   COVID-19 Vaccine (2 - 2023-24 season) 02/12/2022   HEMOGLOBIN A1C  05/21/2022   Diabetic kidney evaluation - eGFR measurement  12/02/2022   Medicare Annual Wellness (AWV)  07/10/2023   HPV VACCINES  Aged Out    Health Maintenance  Health Maintenance Due  Topic Date Due   OPHTHALMOLOGY EXAM  Never done   Diabetic kidney evaluation - Urine ACR  Never done   Hepatitis C Screening  Never done   DTaP/Tdap/Td (1 - Tdap) Never done   COLONOSCOPY (Pts 45-468yrInsurance coverage will need to be confirmed)  Never done   Zoster Vaccines- Shingrix (1 of 2) Never done   Pneumonia Vaccine 6574Years old (1 - PCV) Never done   FOOT EXAM  02/04/2021   INFLUENZA VACCINE  Never done   COVID-19 Vaccine (2 - 2023-24 season) 02/12/2022   HEMOGLOBIN A1C  05/21/2022      Lung Cancer Screening: (Low Dose CT Chest recommended if Age 69-80ears, 30 pack-year currently smoking OR have quit w/in 15years.) does not qualify.   Lung Cancer Screening Referral: N/A  Additional Screening:  Hepatitis C Screening: N/A  Vision Screening: Recommended annual ophthalmology exams for early detection of glaucoma and other disorders of the eye. Is the patient up to date with their annual eye exam?  No  Who is the provider or what is the name of the office in which the patient attends annual eye exams? N/A If pt is not established with a provider, would they like to be referred to a provider to establish care? No .   Dental Screening:  Recommended annual dental exams for proper oral hygiene  Community Resource Referral / Chronic Care Management: CRR required this visit?  No   CCM required this visit?  No      Plan:     I have personally reviewed and noted the following in the patient's chart:   Medical and social history Use of alcohol, tobacco or illicit drugs  Current medications and supplements including opioid prescriptions. Patient is not currently taking opioid prescriptions. Functional ability and status Nutritional status Physical activity Advanced directives List of other physicians Hospitalizations, surgeries, and ER visits in previous 12 months Vitals Screenings to include cognitive, depression, and falls Referrals and appointments  In addition, I have reviewed and discussed with patient certain preventive protocols, quality metrics, and best practice recommendations. A written personalized care plan for preventive services as well as general preventive health recommendations were provided to patient.     JaKerin PernaCMYarmouth Port 07/12/2022   Nurse Notes: Face-To-Face Visit  Nicholas Lam Thank you for taking time to come for your Medicare Wellness Visit. I appreciate your ongoing commitment to your health goals. Please review the following plan we discussed and let me know if I can assist you in the future.   These are the goals we discussed:  Goals   None     This is a list of the screening recommended for you and due dates:  Health Maintenance  Topic Date Due   Eye exam for diabetics  Never done   Yearly kidney health urinalysis for diabetes  Never done   Hepatitis C Screening: USPSTF Recommendation  to screen - Ages 58-79 yo.  Never done   DTaP/Tdap/Td vaccine (1 - Tdap) Never done   Colon Cancer Screening  Never done   Zoster (Shingles) Vaccine (1 of 2) Never done   Pneumonia Vaccine (1 - PCV) Never done   Complete foot exam   02/04/2021   Flu Shot  Never done   COVID-19 Vaccine (2 -  2023-24 season) 02/12/2022   Hemoglobin A1C  05/21/2022   Yearly kidney function blood test for diabetes  12/02/2022   Medicare Annual Wellness Visit  07/10/2023   HPV Vaccine  Aged Out

## 2022-07-12 NOTE — Progress Notes (Signed)
Internal Medicine Clinic Attending  Case and documentation of Dr. Amponsah  soon after the resident saw the patient reviewed.  I reviewed the AWV findings.  I agree with the assessment, diagnosis, and plan of care documented in the AWV note.     

## 2022-07-13 LAB — COLOGUARD: COLOGUARD: NEGATIVE

## 2022-07-20 ENCOUNTER — Ambulatory Visit (INDEPENDENT_AMBULATORY_CARE_PROVIDER_SITE_OTHER): Payer: Medicare Other

## 2022-07-20 ENCOUNTER — Encounter: Payer: Self-pay | Admitting: Physician Assistant

## 2022-07-20 ENCOUNTER — Telehealth: Payer: Self-pay | Admitting: *Deleted

## 2022-07-20 ENCOUNTER — Telehealth: Payer: Self-pay | Admitting: Student

## 2022-07-20 ENCOUNTER — Ambulatory Visit (INDEPENDENT_AMBULATORY_CARE_PROVIDER_SITE_OTHER): Payer: Medicare Other | Admitting: Physician Assistant

## 2022-07-20 DIAGNOSIS — M1652 Unilateral post-traumatic osteoarthritis, left hip: Secondary | ICD-10-CM

## 2022-07-20 DIAGNOSIS — M1651 Unilateral post-traumatic osteoarthritis, right hip: Secondary | ICD-10-CM

## 2022-07-20 MED ORDER — METFORMIN HCL ER 500 MG PO TB24: ORAL_TABLET | ORAL | 3 refills | Status: DC
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
## 2022-07-20 NOTE — Telephone Encounter (Signed)
Thank you for the update.  I have called patient and addressed his concerns.

## 2022-07-20 NOTE — Telephone Encounter (Signed)
Call from patient would like to get his HbA1C down to less than 7.8. Currently not on any meds for Diabetes.  Wants a call to discuss options for decreasing his glucose levels.  Does not feel that talking with the Diabetes Educator will help.  Has done in the past. Message to be sent to Dr. Coy Saunas to speak with patient.

## 2022-07-20 NOTE — Telephone Encounter (Signed)
Patient called to discuss possible treatment for his diabetes.  His recent A1c is 7.8%.  He was evaluated by orthopedic surgery for possible hip replacement however they would like for his A1c to be below 7.8%.  Patient unable to exercise much due to his hip pain.  He has been working on making dietary changes and interested in medication to help improve his A1c.  Reports being on metformin previously but also reports some GI side effects from this. Discussed plan to start at a low dose and escalate to max dose to see if patient able to tolerate. -Start metformin XR 500 mg tab, 1 tablet with breakfast x 1 week then 1 tablet with breakfast and 1 tablet with dinner x 1 week, then 2 tablets with breakfast, 1 tablet with dinner x 1 week then finally 2 tablets with breakfast and 2 tablets with dinner. -Counseled on decreasing intake of foods high in carbs and sugar -Follow-up A1c in 2 to 3 months

## 2022-08-05 ENCOUNTER — Telehealth: Payer: Self-pay | Admitting: Physician Assistant

## 2022-08-05 NOTE — Telephone Encounter (Signed)
Pt called requesting a call from [place] about next option for plan of care. Please call pt at [phone].

## 2022-08-05 NOTE — Telephone Encounter (Signed)
Patient called and wanted to advise that he has a kidney stone and that he would like to hold off on surgery until it is resolved

## 2022-08-06 NOTE — Telephone Encounter (Signed)
ok 

## 2022-08-13 ENCOUNTER — Telehealth: Payer: Self-pay

## 2022-08-13 NOTE — Telephone Encounter (Signed)
Pt called about receiving a letter from transportation   to be filled by his PCP  in order for him to be able to get transportation to anf from his appts  with [place]

## 2022-08-17 ENCOUNTER — Telehealth: Payer: Self-pay | Admitting: Physician Assistant

## 2022-08-17 NOTE — Telephone Encounter (Signed)
Patient states he need a form filled out for public transportation he is having faxed so M.Venida Jarvis can fill out.

## 2022-08-17 NOTE — Telephone Encounter (Signed)
Ok, I guess just let me know when we get it?

## 2022-08-19 NOTE — Telephone Encounter (Signed)
I haven't seen this so far. Thanks.

## 2022-10-07 ENCOUNTER — Ambulatory Visit (INDEPENDENT_AMBULATORY_CARE_PROVIDER_SITE_OTHER): Payer: Medicare Other | Admitting: Student

## 2022-10-07 ENCOUNTER — Encounter: Payer: Self-pay | Admitting: Student

## 2022-10-07 ENCOUNTER — Other Ambulatory Visit: Payer: Self-pay

## 2022-10-07 VITALS — BP 147/91 | HR 77 | Temp 97.8°F | Ht 70.0 in

## 2022-10-07 DIAGNOSIS — E1169 Type 2 diabetes mellitus with other specified complication: Secondary | ICD-10-CM | POA: Diagnosis present

## 2022-10-07 DIAGNOSIS — Z7984 Long term (current) use of oral hypoglycemic drugs: Secondary | ICD-10-CM

## 2022-10-07 DIAGNOSIS — Z1159 Encounter for screening for other viral diseases: Secondary | ICD-10-CM

## 2022-10-07 DIAGNOSIS — E538 Deficiency of other specified B group vitamins: Secondary | ICD-10-CM

## 2022-10-07 DIAGNOSIS — M1651 Unilateral post-traumatic osteoarthritis, right hip: Secondary | ICD-10-CM

## 2022-10-07 DIAGNOSIS — N201 Calculus of ureter: Secondary | ICD-10-CM

## 2022-10-07 DIAGNOSIS — Z0000 Encounter for general adult medical examination without abnormal findings: Secondary | ICD-10-CM

## 2022-10-07 LAB — POCT GLYCOSYLATED HEMOGLOBIN (HGB A1C): Hemoglobin A1C: 7.7 % — AB (ref 4.0–5.6)

## 2022-10-07 LAB — GLUCOSE, CAPILLARY: Glucose-Capillary: 186 mg/dL — ABNORMAL HIGH (ref 70–99)

## 2022-10-07 NOTE — Progress Notes (Unsigned)
   CC: Follow-up  HPI:  [name] is a 67 y.o. male with PMH as below who presents to clinic for follow-up on his chronic medical problems. Please see problem based charting for evaluation, assessment and plan.  Past Medical History:  Diagnosis Date   History of DVT (deep vein thrombosis)    Left retinal detachment    Paroxysmal atrial fibrillation    Type 2 diabetes mellitus    Ureteral stone with hydronephrosis    Review of Systems:  Constitutional: Negative for fever or fatigue Eyes: Negative for visual changes Respiratory: Negative for shortness of breath Cardiac: Negative for chest pain or palpitations MSK: Positive for chronic hip and back pain GU: Positive for nocturia Neuro: Negative for headache or weakness  Physical Exam: General: Pleasant, well-appearing elderly man in a wheelchair. No acute distress. HEENT: Anicteric sclera.  Amblyopia and strabismus of the left eye. Cardiac: RRR. No murmurs, rubs or gallops. No LE edema Respiratory: Lungs CTAB. No wheezing or crackles. Abdominal: Soft. Nontender, slightly reducible ventral hernia. Normal BS. Skin: Warm, dry and intact without rashes or lesions Extremities: Atraumatic. BLE w/ chronic calf enlargement and varicose veins.  Nontender. Neuro: A&O x 3. Moves all extremities.  Normal sensation to gross touch. Psych: Appropriate mood and affect.  Vitals:   10/07/22 1522 10/07/22 1631  BP: (!) 144/85 (!) 147/91  Pulse: 75 77  Temp: 97.8 F (36.6 C)   TempSrc: Oral   SpO2: 98%   Height: 5\' 10"  (1.778 m)    Assessment & Plan:   Type 2 diabetes mellitus (HCC) Patient has been managing his diabetes with metformin and lifestyle modifications with dietary changes. His A1c is slightly down to 7.7% from 7.8% 3 months ago. He is unable to exercise due chronic hip pain and requires assistance for caretaker for most of his ADLs.  Plan: -Continue metformin 1000 mg twice daily -Continue lifestyle modifications  with dietary changes -Follow-up in 3 months for repeat A1c  Osteoarthritis of right hip He was referred to orthopedic surgery and establish care with them in February. They do not think steroid injection will provide much relief. Patient is interested in possible hip replacement however orthopedic would like his A1c below 7.8% before they will perform a hip replacement. A1c is 7.7% today. Patient states he is working on getting his kidney stone removed first before following up with orthopedic surgery. -Follow-up with orthopedic surgery as needed  Ureteral stone Patient has been evaluated by [facility]. They are considering a percutaneous nephrolithotomy (PCNL) as patient had complications from ureteroscopy in the past. Urology would like medical clearance and plan for holding his anticoagulation before proceeding with the procedure.  Patient has a history of recurrent DVTs with IVC filter in place as well as paroxysmal A-fib (currently in normal sinus) to this makes him high risk of developing clots all 4 for anticoagulation. There is at least a moderate risk of significant bleeding with the PCNL procedure. He will need to be off Eliquis for at least 48 hours.   See Encounters Tab for problem based charting.  Patient discussed with Dr. Yolande Jolly, MD, MPH

## 2022-10-07 NOTE — Patient Instructions (Addendum)
Thank you, [name] for allowing [place] to provide your care today. Today we discussed your diabetes, utereal stone, vitamin B12 deficiency.   For your planned PCNL, I will let you know if you need a bridge before surgery.  I have ordered the following labs for you:   Lab Orders         Glucose, capillary         CMP14 + Anion Gap         Vitamin B12         Hepatitis C Ab reflex to Quant PCR         POC Hbg A1C      I will call if any are abnormal. All of your labs can be accessed through "My Chart".  My Chart Access: [email or web address]  Please follow-up in 2 months  Please make sure to arrive 15 minutes prior to your next appointment. If you arrive late, you may be asked to reschedule.    We look forward to seeing you next time. Please call our clinic at [phone] if you have any questions or concerns. The best time to call is Monday-Friday from 9am-4pm, but there is someone available 24/7. If after hours or the weekend, call the main hospital number and ask for the Internal Medicine Resident On-Call. If you need medication refills, please notify your pharmacy one week in advance and they will send [place] a request.   Thank you for letting us take part in your care. Wishing you the best!  Nicholas Rainwater, MD 10/07/2022, 4:40 PM IM Resident, PGY-3 Nicholas Lam 41:10

## 2022-10-08 DIAGNOSIS — Z0000 Encounter for general adult medical examination without abnormal findings: Secondary | ICD-10-CM | POA: Insufficient documentation

## 2022-10-08 LAB — CMP14 + ANION GAP
ALT: 24 IU/L (ref 0–44)
AST: 24 IU/L (ref 0–40)
Albumin/Globulin Ratio: 1.3 (ref 1.2–2.2)
Albumin: 3.7 g/dL — ABNORMAL LOW (ref 3.9–4.9)
Alkaline Phosphatase: 99 IU/L (ref 44–121)
Anion Gap: 17 mmol/L (ref 10.0–18.0)
BUN/Creatinine Ratio: 13 (ref 10–24)
BUN: 16 mg/dL (ref 8–27)
Bilirubin Total: 0.4 mg/dL (ref 0.0–1.2)
CO2: 18 mmol/L — ABNORMAL LOW (ref 20–29)
Calcium: 9.6 mg/dL (ref 8.6–10.2)
Chloride: 100 mmol/L (ref 96–106)
Creatinine, Ser: 1.24 mg/dL (ref 0.76–1.27)
Globulin, Total: 2.9 g/dL (ref 1.5–4.5)
Glucose: 184 mg/dL — ABNORMAL HIGH (ref 70–99)
Potassium: 4.6 mmol/L (ref 3.5–5.2)
Sodium: 135 mmol/L (ref 134–144)
Total Protein: 6.6 g/dL (ref 6.0–8.5)
eGFR: 63 mL/min/{1.73_m2} (ref >59)

## 2022-10-08 LAB — HCV AB W REFLEX TO QUANT PCR: HCV Ab: NONREACTIVE

## 2022-10-08 LAB — VITAMIN B12: Vitamin B-12: >2000 pg/mL — ABNORMAL HIGH (ref 232–1245)

## 2022-10-08 LAB — HCV INTERPRETATION

## 2022-10-08 MED ORDER — METFORMIN HCL ER 500 MG PO TB24: 1000.0000 mg | ORAL_TABLET | Freq: Two times a day (BID) | ORAL | 3 refills | Status: DC

## 2022-10-08 NOTE — Assessment & Plan Note (Signed)
Hepatitis C screening is negative today. 

## 2022-10-08 NOTE — Assessment & Plan Note (Addendum)
Patient has been evaluated by [facility]. They are considering a percutaneous nephrolithotomy (PCNL) as patient had complications from ureteroscopy in the past. Urology would like medical clearance and plan for holding his anticoagulation before proceeding with the procedure.  Patient has a history of recurrent DVTs with IVC filter in place as well as paroxysmal A-fib (currently in normal sinus) to this makes him high risk of developing clots all 4 for anticoagulation. There is at least a moderate risk of significant bleeding with the PCNL procedure. He will need to be off Eliquis for at least 48 hours.

## 2022-10-08 NOTE — Assessment & Plan Note (Signed)
Patient has been managing his diabetes with metformin and lifestyle modifications with dietary changes. His A1c is slightly down to 7.7% from 7.8% 3 months ago. He is unable to exercise due chronic hip pain and requires assistance for caretaker for most of his ADLs.  Plan: -Continue metformin 1000 mg twice daily -Continue lifestyle modifications with dietary changes -Follow-up in 3 months for repeat A1c

## 2022-10-08 NOTE — Assessment & Plan Note (Addendum)
He was referred to orthopedic surgery and establish care with them in February. They do not think steroid injection will provide much relief. Patient is interested in possible hip replacement however orthopedic would like his A1c below 7.8% before they will perform a hip replacement. A1c is 7.7% today. Patient states he is working on getting his kidney stone removed first before following up with orthopedic surgery. -Follow-up with orthopedic surgery as needed

## 2022-10-11 NOTE — Progress Notes (Signed)
CMP is unremarkable, vitamin B12 significantly elevated and hepatitis C screening negative. Discussed results with patient on Friday and advised him to stop the vitamin B12 supplementation.

## 2022-10-12 ENCOUNTER — Encounter: Payer: Self-pay | Admitting: Student

## 2022-10-14 ENCOUNTER — Encounter: Payer: Self-pay | Admitting: Student

## 2022-10-20 NOTE — Progress Notes (Signed)
Internal Medicine Clinic Attending  Case discussed with Dr. Amponsah  at the time of the visit.  We reviewed the resident's history and exam and pertinent patient test results.  I agree with the assessment, diagnosis, and plan of care documented in the resident's note.  

## 2022-11-02 ENCOUNTER — Telehealth: Payer: Self-pay | Admitting: Student

## 2022-11-02 DIAGNOSIS — N201 Calculus of ureter: Secondary | ICD-10-CM

## 2022-11-02 NOTE — Telephone Encounter (Signed)
Pt is calling back in reference to a new Referral being placed again.  Pt is unhappy with [facility] and would to to be seen at the [facility].  Can a new referral be placed to [facility] instead of the patient going back to [facility] as soon as possible?

## 2022-11-08 ENCOUNTER — Encounter: Payer: Self-pay | Admitting: *Deleted

## 2022-11-09 ENCOUNTER — Other Ambulatory Visit: Payer: Self-pay

## 2022-11-09 DIAGNOSIS — E1169 Type 2 diabetes mellitus with other specified complication: Secondary | ICD-10-CM

## 2022-11-11 ENCOUNTER — Other Ambulatory Visit: Payer: Self-pay | Admitting: Student

## 2022-11-11 DIAGNOSIS — E1169 Type 2 diabetes mellitus with other specified complication: Secondary | ICD-10-CM

## 2022-11-11 MED ORDER — METFORMIN HCL ER (MOD) 1000 MG PO TB24
1000.0000 mg | ORAL_TABLET | Freq: Two times a day (BID) | ORAL | 3 refills | Status: DC
Start: 2022-11-11 — End: 2022-11-11

## 2022-12-20 ENCOUNTER — Encounter: Payer: Self-pay | Admitting: Student

## 2022-12-20 ENCOUNTER — Other Ambulatory Visit: Payer: Self-pay

## 2022-12-20 ENCOUNTER — Ambulatory Visit (INDEPENDENT_AMBULATORY_CARE_PROVIDER_SITE_OTHER): Payer: Medicare Other | Admitting: Student

## 2022-12-20 VITALS — BP 134/91 | HR 86 | Temp 98.0°F | Resp 32 | Ht 70.0 in | Wt 233.0 lb

## 2022-12-20 DIAGNOSIS — Z7901 Long term (current) use of anticoagulants: Secondary | ICD-10-CM | POA: Diagnosis not present

## 2022-12-20 DIAGNOSIS — E1169 Type 2 diabetes mellitus with other specified complication: Secondary | ICD-10-CM

## 2022-12-20 DIAGNOSIS — N201 Calculus of ureter: Secondary | ICD-10-CM

## 2022-12-20 DIAGNOSIS — E1140 Type 2 diabetes mellitus with diabetic neuropathy, unspecified: Secondary | ICD-10-CM

## 2022-12-20 DIAGNOSIS — I480 Paroxysmal atrial fibrillation: Secondary | ICD-10-CM | POA: Diagnosis not present

## 2022-12-20 LAB — GLUCOSE, CAPILLARY: Glucose-Capillary: 180 mg/dL — ABNORMAL HIGH (ref 70–99)

## 2022-12-20 LAB — POCT GLYCOSYLATED HEMOGLOBIN (HGB A1C): Hemoglobin A1C: 7.3 % — AB (ref 4.0–5.6)

## 2022-12-20 NOTE — Progress Notes (Signed)
    Subjective:  Nicholas Lam is a 67 y.o. who presents to clinic for the following:  No acute concerns. Has percutaneous nephrolithotomy for large left UPJ stone upcoming in a few days. Will stop Eliquis 48 hours prior to procedure. For diabetes, currently off all medications. Hasn't been on metformin for several months. This medicine causes intolerable GI side-effects.  Overall doing well, hopeful for resolution of chronic UTI after his lithotripsy.  Objective:   Vitals:   12/20/22 1439 12/20/22 1451 12/20/22 1615  BP: (!) 143/94 (!) 153/82 (!) 134/91  Pulse: 84 82 86  Resp: (!) 32    Temp: 98 F (36.7 C)    TempSrc: Oral    SpO2: 95%    Weight: 233 lb (105.7 kg)    Height: 5\' 10"  (1.778 m)      Physical Exam Overall well-appearing Heart rate is normal, rhythm is regular, radial pulses are strong, no appreciable murmurs, no lower extremity edema Breathing is regular and unlabored, no wheezing or crackles Skin is pale, warm, and dry, lower extremities with mottling but otherwise warm with decent cap refill Both feet are markedly dry, calluses on plantar aspect of right foot, left foot is insensate to monofilament testing, no ulcers Alert and oriented, speech is normal sounding, no facial asymmetry Pleasant, concordant affect  Assessment & Plan:   Problem List Items Addressed This Visit     Diabetes mellitus with diabetic neuropathy (HCC) - Primary    Hemoglobin A1c 7.3%, down from 7.7%.  Has been off all agents for diabetes for several months now.  Metformin was intolerable to him.  SGLT2 inhibitor is a bad choice because of his propensity for UTI.  Talked about GLP-1 agonists today.  He would prefer not to start anything at present, at any rate he seems to be doing well and I think it is reasonable to defer conversations about starting a new medication until after surgery.  Exam notable for neuropathy, his feet are high risk.  Referral to podiatry placed.       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  Awaiting percutaneous nephrolithotomy, which is planned for 12/24/2022.  Will hold Eliquis for 48 hours prior to the surgery.  Following with Dr. Juanito Doom of [facility] urology.      Paroxysmal atrial fibrillation (HCC) (Chronic)    Regular rate and rhythm on exam today.  Anticoagulated on Eliquis.       Return with Dr. Benito Mccreedy for diabetes.  Patient discussed with Dr. Elige Ko MD 12/21/2022, 5:13 PM  [phone]

## 2022-12-20 NOTE — Assessment & Plan Note (Signed)
Hemoglobin A1c 7.3%, down from 7.7%.  Has been off all agents for diabetes for several months now.  Metformin was intolerable to him.  SGLT2 inhibitor is a bad choice because of his propensity for UTI.  Talked about GLP-1 agonists today.  He would prefer not to start anything at present, at any rate he seems to be doing well and I think it is reasonable to defer conversations about starting a new medication until after surgery.

## 2022-12-20 NOTE — Patient Instructions (Signed)
This after visit summary is an important review of tests, referrals, and medication changes that were discussed during your visit. If you have questions or concerns, call [phone]. Outside of clinic business hours, call the main hospital at [phone] and ask the operator for the on-call internal medicine resident.   Ernesta Amble MD 12/20/2022, 3:50 PM

## 2022-12-20 NOTE — Assessment & Plan Note (Addendum)
Chronic and stable.  Awaiting percutaneous nephrolithotomy, which is planned for 12/24/2022.  Will hold Eliquis for 48 hours prior to the surgery.  Following with Dr. Juanito Doom of [facility] urology.

## 2022-12-20 NOTE — Assessment & Plan Note (Signed)
Regular rate and rhythm on exam today.  Anticoagulated on Eliquis.

## 2022-12-21 ENCOUNTER — Telehealth: Payer: Self-pay

## 2022-12-21 LAB — MICROALBUMIN / CREATININE URINE RATIO
Creatinine, Urine: 135.5 mg/dL
Microalb/Creat Ratio: 28 mg/g creat (ref 0–29)
Microalbumin, Urine: 38.6 ug/mL

## 2022-12-21 NOTE — Telephone Encounter (Signed)
Patient called he stated an error has been made in his AVS regarding a procedure he is supposed to be having, he stated the procedure listed in his AVS is incorrect. Please return patients call to discuss further.

## 2022-12-27 ENCOUNTER — Telehealth: Payer: Self-pay

## 2022-12-27 NOTE — Transitions of Care (Post Inpatient/ED Visit) (Signed)
   12/27/2022  Name: Nicholas Lam MRN: [id number] DOB: [birth year]  Today's TOC FU Call Status: Today's TOC FU Call Status:: Successful TOC FU Call Competed TOC FU Call Complete Date: 12/27/22  Transition Care Management Follow-up Telephone Call Date of Discharge: 12/26/22 Discharge Facility: Other ([facility]) Name of [facility] ([facility]) [facility]charge Facility: [facility] Type of Discharge: Inpatient Admission Primary Inpatient Discharge Diagnosis:: kidney stone How have you been since you were released from the hospital?: Better Any questions or concerns?: No  Items Reviewed: Did you receive and understand the discharge instructions provided?: Yes Medications obtained,verified, and reconciled?: Yes (Medications Reviewed) Any new allergies since your discharge?: No Dietary orders reviewed?: Yes Do you have support at home?: Yes People in Home: friend(s)  Medications Reviewed Today: Medications Reviewed Today     Reviewed by Karena Addison, LPN (Licensed Practical Nurse) on 12/27/22 at 1110  Med List Status: <None>   Medication Order Taking? Sig Documenting Provider Last Dose Status Informant  acetaminophen (TYLENOL) 325 MG tablet [id number] Yes Take 325 mg by mouth every 4 (four) hours as needed. [provider]  Active   amoxicillin-clavulanate (AUGMENTIN) 875-125 MG tablet [id number] Yes Take 1 tablet by mouth 2 (two) times daily. [provider]  Active   apixaban (ELIQUIS) 5 MG TABS tablet [id number]  Take 1 tablet (5 mg total) by mouth 2 (two) times daily. Steffanie Rainwater, MD  Active   Bromelains (BROMELAIN PO) [id number]  Take by mouth. [provider]  Active   phenazopyridine (PYRIDIUM) 95 MG tablet [id number]  Take 95 mg by mouth 3 (three) times daily as needed for pain. [provider]  Active             Home Care and Equipment/Supplies: Were Home Health Services Ordered?: NA Any new equipment or medical supplies ordered?:  NA  Functional Questionnaire: Do you need assistance with bathing/showering or dressing?: No Do you need assistance with meal preparation?: No Do you need assistance with eating?: No Do you have difficulty maintaining continence: No Do you need assistance with getting out of bed/getting out of a chair/moving?: No Do you have difficulty managing or taking your medications?: No  Follow up appointments reviewed: PCP Follow-up appointment confirmed?: NA Specialist Hospital Follow-up appointment confirmed?: Yes Date of Specialist follow-up appointment?: 01/06/23 Follow-Up Specialty Provider:: surgeon Do you need transportation to your follow-up appointment?: No Do you understand care options if your condition(s) worsen?: Yes-patient verbalized understanding    SIGNATURE Karena Addison, LPN [facility] Nurse Health Advisor Direct Dial [phone]

## 2022-12-28 NOTE — Progress Notes (Signed)
 Internal Medicine Clinic Attending  Case discussed with the resident at the time of the visit.  We reviewed the resident's history and exam and pertinent patient test results.  I agree with the assessment, diagnosis, and plan of care documented in the resident's note.  

## 2023-01-27 ENCOUNTER — Ambulatory Visit (INDEPENDENT_AMBULATORY_CARE_PROVIDER_SITE_OTHER): Payer: Medicare Other | Admitting: Student

## 2023-01-27 DIAGNOSIS — M1651 Unilateral post-traumatic osteoarthritis, right hip: Secondary | ICD-10-CM

## 2023-01-27 NOTE — Assessment & Plan Note (Signed)
The patient has a history of right hip osteoarthritis and was previously evaluated at [facility] orthopedic practice for a right hip arthroplasty.  The patient endorses right hip pain at this moment and would like to proceed with a total right hip arthroplasty.  The patient would like to have this surgery performed by [facility] in [place].  Plan -Referral for [facility] was sent -The patient's recent A1c was 7.3, patient will need preoperative evaluation in the future if he does proceed with a total right hip arthroplasty. -The patient did report concern about a recent nephrolithiasis being removed at [facility]. He was concerned for possible infection from the renal stone, further reported concerned that this infection could possibly infect the new hip replacement.  I informed him to follow-up with his urologist at [facility] and provided reassurance that we will not proceed with a surgery unless he is safe and optimized.

## 2023-01-27 NOTE — Progress Notes (Signed)
  [facility] Internal Medicine Residency Telephone Encounter Continuity Care Appointment  HPI:  This telephone encounter was created for Mr. Nicholas Lam on 01/27/2023 for the following purpose/cc a referral to [facility] for right hip pain .   Past Medical History:  Past Medical History:  Diagnosis Date   History of DVT (deep vein thrombosis)    Left retinal detachment    Paroxysmal atrial fibrillation (HCC)    Type 2 diabetes mellitus (HCC)    Ureteral stone with hydronephrosis      ROS  No complaints at this time    Assessment / Plan / Recommendations:  Please see A&P under problem oriented charting for assessment of the patient's acute and chronic medical conditions.  As always, pt is advised that if symptoms worsen or new symptoms arise, they should go to an urgent care facility or to to ER for further evaluation.  Osteoarthritis of the right hip The patient has a history of right hip osteoarthritis and was previously evaluated at [facility] orthopedic practice for a right hip arthroplasty.  The patient endorses right hip pain at this moment and would like to proceed with a total right hip arthroplasty.  The patient would like to have this surgery performed by [facility] in [place].  Plan -Referral for [facility] was sent -The patient's recent A1c was 7.3, patient will need preoperative evaluation in the future if he does proceed with a total right hip arthroplasty. -The patient did report concern about a recent nephrolithiasis being removed at [facility]. He was concerned for possible infection from the renal stone, further reported concerned that this infection could possibly infect the new hip replacement.  I informed him to follow-up with his urologist at [facility] and provided reassurance that we will not proceed with a surgery unless he is safe and optimized.   Consent and Medical Decision Making:  Patient seen with Dr.  Lafonda Mosses This is a telephone encounter between [employer] and Faith Rogue on 01/27/2023 for referral for right hip pain. The visit was conducted with the patient located at home and Faith Rogue at [facility]. The patient's identity was confirmed using their DOB and current address. The patient has consented to being evaluated through a telephone encounter and understands the associated risks (an examination cannot be done and the patient may need to come in for an appointment) / benefits (allows the patient to remain at home, decreasing exposure to coronavirus). I personally spent 15 minutes on medical discussion.

## 2023-02-01 NOTE — Progress Notes (Signed)
Internal Medicine Clinic Attending  I was physically present with the resident on this telemedicine encounter.  I agree with the assessment, diagnosis, and plan of care documented in the resident's note. \   Patient presents for telemedicine requesting an Orthopedic Surgery referral. Referral placed.

## 2023-06-30 ENCOUNTER — Telehealth: Payer: Self-pay | Admitting: Student

## 2023-06-30 NOTE — Telephone Encounter (Signed)
Pt states he may have a UTI and would like for a nurse to give him a call back.

## 2023-06-30 NOTE — Telephone Encounter (Signed)
Return pt's call.stated he has been having "bladder pressure" , urinary frequency, and "growth" at the tip of his penis.  Stated he has a hx of UTI- he has been referred to [facility] but "that didn't go too well".  He has transportation issues; uses an electric w/c and has to use a Lift. We have no available appts until the 29th. [facility]ated [facility] [address]. He also stated he needs paperwork completed - he schedule an appt 1/29@ 1415PM.

## 2023-07-01 ENCOUNTER — Ambulatory Visit
Admission: RE | Admit: 2023-07-01 | Discharge: 2023-07-01 | Disposition: A | Discharge status: Home / Self Care | Payer: Medicare Other | Source: Ambulatory Visit | Attending: Urgent Care | Admitting: Urgent Care

## 2023-07-01 VITALS — BP 142/88 | HR 106 | Temp 97.5°F | Resp 20

## 2023-07-01 DIAGNOSIS — N481 Balanitis: Secondary | ICD-10-CM | POA: Insufficient documentation

## 2023-07-01 DIAGNOSIS — Z87440 Personal history of urinary (tract) infections: Secondary | ICD-10-CM | POA: Insufficient documentation

## 2023-07-01 DIAGNOSIS — R350 Frequency of micturition: Secondary | ICD-10-CM | POA: Diagnosis not present

## 2023-07-01 LAB — POCT URINALYSIS DIP (MANUAL ENTRY)
Bilirubin, UA: NEGATIVE
Glucose, UA: >=1000 mg/dL — AB
Ketones, POC UA: NEGATIVE mg/dL
Nitrite, UA: NEGATIVE
Protein Ur, POC: 30 mg/dL — AB
Spec Grav, UA: 1.02 (ref 1.010–1.025)
Urobilinogen, UA: 0.2 U/dL
pH, UA: 5.5 (ref 5.0–8.0)

## 2023-07-01 MED ORDER — NYSTATIN-TRIAMCINOLONE 100000-0.1 UNIT/GM-% EX CREA: TOPICAL_CREAM | Freq: Two times a day (BID) | CUTANEOUS | 0 refills | Status: AC

## 2023-07-01 MED ORDER — FLUCONAZOLE 150 MG PO TABS: 150.0000 mg | ORAL_TABLET | ORAL | 0 refills | Status: DC

## 2023-07-01 NOTE — ED Triage Notes (Signed)
Pt reports increase urinary frequency x 1 week, states this happened every time his colon is active and "a growth or an infection" on the tip of his penis, pt think can be related urinary frequency  frequency or fungal infection; pt saw blood in his towel when he was cleaning his groin area.

## 2023-07-01 NOTE — Discharge Instructions (Signed)
Start oral fluconazole and Mycolog for balanitis. We will hold off on antibiotics for an urinary tract infection. Once we see your urine culture we will reach out to you for that.

## 2023-07-01 NOTE — ED Provider Notes (Signed)
[facility] URGENT CARE C[facility]  Note:  This document was prepared using Conservation officer, historic buildings and may include unintentional dictation errors.  MRN: [id number] DOB: 10/25/1955  Subjective:   Nicholas Lam is a 68 y.o. male presenting for 1 week history of persistent urinary frequency, penile irritation, swelling, difficulty retracting the foreskin.  Patient is concerned about a few spots on the head of the penis itself.  Reports a history of UTIs but is more concerned about fungal infection.  Has previously taken UTI medications that did not do well for him.  He did not have any particular allergic reactions but feels that it did not help his general symptoms at all.  No current facility-administered medications for this encounter.  Current Outpatient Medications:    Cholecalciferol 250 MCG (10000 UT) CAPS, Take by mouth., Disp: , Rfl:    apixaban (ELIQUIS) 5 MG TABS tablet, Take 1 tablet (5 mg total) by mouth 2 (two) times daily., Disp: 180 tablet, Rfl: 5   Bromelains (BROMELAIN PO), Take by mouth., Disp: , Rfl:    Multiple Vitamin (MULTI-VITAMIN) tablet, Take 1 tablet by mouth daily., Disp: , Rfl:    phenazopyridine (PYRIDIUM) 95 MG tablet, Take 95 mg by mouth 3 (three) times daily as needed for pain., Disp: , Rfl:    Allergies  Allergen Reactions   Iodinated Contrast Media Hives, Rash and Swelling   Iodine     Past Medical History:  Diagnosis Date   History of DVT (deep vein thrombosis)    Left retinal detachment    Paroxysmal atrial fibrillation (HCC)    Type 2 diabetes mellitus (HCC)    Ureteral stone with hydronephrosis      Past Surgical History:  Procedure Laterality Date   CORONARY ULTRASOUND/IVUS Bilateral 11/24/2021   Procedure: Intravascular Ultrasound/IVUS;  Surgeon: Nada Libman, MD;  Location: [facility] INVASIVE CV LAB;  Service: Cardiovascular;  Laterality: Bilateral;  IVC BIL LOWER EXT   LOWER EXTREMITY VENOGRAPHY Bilateral 11/24/2021    Procedure: LOWER EXTREMITY VENOGRAPHY;  Surgeon: Nada Libman, MD;  Location: [facility] INVASIVE CV LAB;  Service: Cardiovascular;  Laterality: Bilateral;   PERIPHERAL VASCULAR BALLOON ANGIOPLASTY Bilateral 11/24/2021   Procedure: PERIPHERAL VASCULAR BALLOON ANGIOPLASTY;  Surgeon: Nada Libman, MD;  Location: [facility] INVASIVE CV LAB;  Service: Cardiovascular;  Laterality: Bilateral;   PERIPHERAL VASCULAR THROMBECTOMY Bilateral 11/24/2021   Procedure: PERIPHERAL VASCULAR THROMBECTOMY;  Surgeon: Nada Libman, MD;  Location: [facility] INVASIVE CV LAB;  Service: Cardiovascular;  Laterality: Bilateral;  LOWER EXT    No family history on file.  Social History   Tobacco Use   Smoking status: Never   Smokeless tobacco: Never  Substance Use Topics   Alcohol use: Never   Drug use: Never    ROS   Objective:   Vitals: BP (!) 142/88 (BP Location: Left Arm)   Pulse (!) 106   Temp (!) 97.5 F (36.4 C) (Oral)   Resp 20   SpO2 95%   Physical Exam Constitutional:      General: He is not in acute distress.    Appearance: Normal appearance. He is well-developed and normal weight. He is not ill-appearing, toxic-appearing or diaphoretic.  HENT:     Head: Normocephalic and atraumatic.     Right Ear: External ear normal.     Left Ear: External ear normal.     Nose: Nose normal.     Mouth/Throat:     Pharynx: Oropharynx is clear.  Eyes:  General: No scleral icterus.       Right eye: No discharge.        Left eye: No discharge.     Extraocular Movements: Extraocular movements intact.  Cardiovascular:     Rate and Rhythm: Normal rate.  Pulmonary:     Effort: Pulmonary effort is normal.  Genitourinary:    Penis: Uncircumcised. Tenderness (with erythema of the glans penis) present. No phimosis, paraphimosis, hypospadias, erythema, discharge, swelling or lesions.   Musculoskeletal:     Cervical back: Normal range of motion.  Neurological:     Mental Status: He is alert and oriented to person,  place, and time.  Psychiatric:        Mood and Affect: Mood normal.        Behavior: Behavior normal.        Thought Content: Thought content normal.        Judgment: Judgment normal.     Results for orders placed or performed during the hospital encounter of 07/01/23 (from the past 24 hours)  POCT urinalysis dipstick     Status: Abnormal   Collection Time: 07/01/23  1:43 PM  Result Value Ref Range   Color, UA yellow yellow   Clarity, UA cloudy (A) clear   Glucose, UA >=1,000 (A) negative mg/dL   Bilirubin, UA negative negative   Ketones, POC UA negative negative mg/dL   Spec Grav, UA 6.962 9.528 - 1.025   Blood, UA moderate (A) negative   pH, UA 5.5 5.0 - 8.0   Protein Ur, POC =30 (A) negative mg/dL   Urobilinogen, UA 0.2 0.2 or 1.0 E.U./dL   Nitrite, UA Negative Negative   Leukocytes, UA Trace (A) Negative    Assessment and Plan :   PDMP not reviewed this encounter.  1. Balanitis   2. Urinary frequency   3. History of UTI    Will manage for balanitis with oral fluconazole, Mycolog.  Urine culture pending, will defer antibiotic use pending these results.  Counseled patient on potential for adverse effects with medications prescribed/recommended today, ER and return-to-clinic precautions discussed, patient verbalized understanding.    Wallis Bamberg, [place] 07/01/23 [id number]

## 2023-07-03 LAB — URINE CULTURE: Culture: 60000 — AB

## 2023-07-13 ENCOUNTER — Ambulatory Visit (INDEPENDENT_AMBULATORY_CARE_PROVIDER_SITE_OTHER): Payer: Medicare Other | Admitting: Internal Medicine

## 2023-07-13 VITALS — BP 126/90 | HR 97 | Temp 97.7°F | Ht 70.0 in | Wt 233.7 lb

## 2023-07-13 DIAGNOSIS — M1651 Unilateral post-traumatic osteoarthritis, right hip: Secondary | ICD-10-CM | POA: Diagnosis not present

## 2023-07-13 DIAGNOSIS — B354 Tinea corporis: Secondary | ICD-10-CM | POA: Diagnosis not present

## 2023-07-13 DIAGNOSIS — Z7289 Other problems related to lifestyle: Secondary | ICD-10-CM

## 2023-07-13 DIAGNOSIS — N390 Urinary tract infection, site not specified: Secondary | ICD-10-CM

## 2023-07-13 DIAGNOSIS — R350 Frequency of micturition: Secondary | ICD-10-CM | POA: Diagnosis not present

## 2023-07-13 DIAGNOSIS — Z7985 Long-term (current) use of injectable non-insulin antidiabetic drugs: Secondary | ICD-10-CM

## 2023-07-13 DIAGNOSIS — E1140 Type 2 diabetes mellitus with diabetic neuropathy, unspecified: Secondary | ICD-10-CM

## 2023-07-13 DIAGNOSIS — Z114 Encounter for screening for human immunodeficiency virus [HIV]: Secondary | ICD-10-CM

## 2023-07-13 LAB — POCT URINALYSIS DIPSTICK
Bilirubin, UA: NEGATIVE
Glucose, UA: POSITIVE — AB
Ketones, UA: NEGATIVE
Leukocytes, UA: NEGATIVE
Nitrite, UA: NEGATIVE
Protein, UA: NEGATIVE
Spec Grav, UA: 1.015 (ref 1.010–1.025)
Urobilinogen, UA: 0.2 U/dL
pH, UA: 5.5 (ref 5.0–8.0)

## 2023-07-13 LAB — POCT GLYCOSYLATED HEMOGLOBIN (HGB A1C): Hemoglobin A1C: 9.4 % — AB (ref 4.0–5.6)

## 2023-07-13 LAB — GLUCOSE, CAPILLARY: Glucose-Capillary: 346 mg/dL — ABNORMAL HIGH (ref 70–99)

## 2023-07-13 MED ORDER — OZEMPIC (0.25 OR 0.5 MG/DOSE) 2 MG/1.5ML ~~LOC~~ SOPN: 0.2500 mg | PEN_INJECTOR | SUBCUTANEOUS | 3 refills | Status: DC

## 2023-07-13 MED ORDER — TERBINAFINE HCL 250 MG PO TABS: 250.0000 mg | ORAL_TABLET | Freq: Every day | ORAL | 0 refills | Status: AC

## 2023-07-13 NOTE — Patient Instructions (Signed)
Thank you, Nicholas Lam for allowing [place] to provide your care today.   Fungus infection Stop taking diflucan. I am sending in terbinafine which you will take daily for the next 7 days. I am checking you blood work today to look at immune system.  Diabetes I can no clear you for surgery with your diabetes. I am starting ozempic which you will take weekly.  I do think you may need insulin to get this under control.  I have referred you back to urology.  I have ordered the following labs for you:   Lab Orders         Glucose, capillary         HIV antibody (with reflex)         CBC with Diff         POC Hbg A1C      I have ordered the following medication/changed the following medications:   Stop the following medications: Medications Discontinued During This Encounter  Medication Reason   fluconazole (DIFLUCAN) 150 MG tablet Change in therapy     Start the following medications: Meds ordered this encounter  Medications   Semaglutide,0.25 or 0.5MG /DOS, (OZEMPIC, 0.25 OR 0.5 MG/DOSE,) 2 MG/1.5ML SOPN    Sig: Inject 0.25 mg into the skin once a week.    Dispense:  0.75 mL    Refill:  3   terbinafine (LAMISIL) 250 MG tablet    Sig: Take 1 tablet (250 mg total) by mouth daily for 7 days.    Dispense:  7 tablet    Refill:  0     Follow up:  1-2 weeks    We look forward to seeing you next time. Please call our clinic at [phone] if you have any questions or concerns. The best time to call is Monday-Friday from 9am-4pm, but there is someone available 24/7. If after hours or the weekend, call the main hospital number and ask for the Internal Medicine Resident On-Call. If you need medication refills, please notify your pharmacy one week in advance and they will send [place] a request.   Thank you for trusting me with your care. Wishing you the best!   Rudene Christians, DO [facility]

## 2023-07-13 NOTE — Progress Notes (Unsigned)
Subjective:  CC: Dysuria  HPI:  [name] is a 68 y.o. 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   He was seen in urgent care on January 17 for resistant urinary frequency, penile irritation, swelling, difficulty retracting foreskin.  He was prescribed fluconazole and Mycolog. UA with moderate blood.  He has a significant history of nephrolithiasis.  He had a stent placed in 2017 that has since been removed.  He had a CT last fall which showed a 1.5 cm stone and underwent percutaneous lithotripsy with stone extraction.  Please see problem based assessment and plan for additional details.  Past Medical History:  Diagnosis Date   History of DVT (deep vein thrombosis)    Left retinal detachment    Paroxysmal atrial fibrillation (HCC)    Type 2 diabetes mellitus (HCC)    Ureteral stone with hydronephrosis     MEDICATIONS:  Eliquis 5 mg twice daily Pyridium 95 mg 3 times daily as needed Diflucan 150 mg weekly Vitamin D2 50 mcg   No family history on file.  Social History   Socioeconomic History   Marital status: Single    Spouse name: Not on file   Number of children: Not on file   Years of education: Not on file   Highest education level: Not on file  Occupational History   Not on file  Tobacco Use   Smoking status: Never   Smokeless tobacco: Never  Substance and Sexual Activity   Alcohol use: Never   Drug use: Never   Sexual activity: Not on file  Other Topics Concern   Not on file  Social History Narrative   Not on file   Social Drivers of Health   Financial Resource Strain: Low Risk  (12/24/2022)   Received from [facility]   Overall Financial  Resource Strain (CARDIA)    Difficulty of Paying Living Expenses: Not hard at all  Food Insecurity: No Food Insecurity (12/24/2022)   Received from [facility]   Hunger Vital Sign    Worried About Running Out of Food in the Last Year: Never true    Ran Out of Food in the Last Year: Never true  Transportation Needs: Unknown (12/24/2022)   Received from [facility] - Transportation    In the past 12 months, has lack of transportation kept you from medical appointments or from getting medications?: No    Lack of Transportation (Non-Medical): Not on file  Physical Activity: Inactive (07/12/2022)   Exercise Vital Sign    Days of Exercise per Week: 0 days    Minutes of Exercise per Session: 0 min  Stress: No Stress Concern Present (07/12/2022)   [employer] of Occupational Health - Occupational Stress Questionnaire    Feeling of Stress : Not at all  Social Connections: Moderately Isolated (07/12/2022)   Social Connection and Isolation Panel [NHANES]    Frequency of Communication with Friends and Family: More than three times a week    Frequency of Social Gatherings with Friends and Family: Once a week    Attends Religious Services: Never    Database administrator or Organizations: No    Attends Banker Meetings: 1 to 4 times per  year    Marital Status: Divorced  Catering manager Violence: Not At Risk (07/12/2022)   Humiliation, Afraid, Rape, and Kick questionnaire    Fear of Current or Ex-Partner: No    Emotionally Abused: No    Physically Abused: No    Sexually Abused: No    Review of Systems: ROS negative except for what is noted on the assessment and plan.  Objective:  There were no vitals filed for this visit.  Physical Exam: Constitutional: well-appearing *** sitting in ***, in no acute distress HENT: normocephalic atraumatic, mucous membranes moist Eyes: conjunctiva non-erythematous Neck: supple Cardiovascular:  regular rate and rhythm, no m/r/g Pulmonary/Chest: normal work of breathing on room air, lungs clear to auscultation bilaterally Abdominal: soft, non-tender, non-distended MSK: normal bulk and tone Neurological: alert & oriented x 3, 5/5 strength in bilateral upper and lower extremities, normal gait Skin: warm and dry Psych: ***     Assessment & Plan:  No problem-specific Assessment & Plan notes found for this encounter.   Pre-op Right hip surgery.  On eliquis for p Afib RCRI score 0 points with 3.9% risk of major cardiac event P: A1C Hold eliquis 2 days prior to surgery  DM Lab Results  Component Value Date   HGBA1C 7.3 (A) 12/20/2022     Patient {[id number]::"discussed with","seen with"} Dr. {[id number]::"AOZHYQMV","H. Hoffman","Mullen","Narendra","Machen","Vincent","Guilloud","Lau"}   [employer], D.O. [facility] Internal Medicine  PGY-3 Pager: [phone]  Phone: [phone] Date 07/13/2023  Time 7:48 AM

## 2023-07-14 DIAGNOSIS — B3749 Other urogenital candidiasis: Secondary | ICD-10-CM | POA: Insufficient documentation

## 2023-07-14 DIAGNOSIS — B354 Tinea corporis: Secondary | ICD-10-CM | POA: Insufficient documentation

## 2023-07-14 DIAGNOSIS — N390 Urinary tract infection, site not specified: Secondary | ICD-10-CM | POA: Insufficient documentation

## 2023-07-14 LAB — CBC WITH DIFFERENTIAL/PLATELET
Basophils Absolute: 0 10*3/uL (ref 0.0–0.2)
Basos: 0 %
EOS (ABSOLUTE): 0 10*3/uL (ref 0.0–0.4)
Eos: 0 %
Hematocrit: 50 % (ref 37.5–51.0)
Hemoglobin: 16.8 g/dL (ref 13.0–17.7)
Immature Grans (Abs): 0 10*3/uL (ref 0.0–0.1)
Immature Granulocytes: 1 %
Lymphocytes Absolute: 2 10*3/uL (ref 0.7–3.1)
Lymphs: 23 %
MCH: 30.2 pg (ref 26.6–33.0)
MCHC: 33.6 g/dL (ref 31.5–35.7)
MCV: 90 fL (ref 79–97)
Monocytes Absolute: 0.6 10*3/uL (ref 0.1–0.9)
Monocytes: 7 %
Neutrophils Absolute: 6 10*3/uL (ref 1.4–7.0)
Neutrophils: 69 %
Platelets: 248 10*3/uL (ref 150–450)
RBC: 5.56 x10E6/uL (ref 4.14–5.80)
RDW: 12.9 % (ref 11.6–15.4)
WBC: 8.6 10*3/uL (ref 3.4–10.8)

## 2023-07-14 LAB — HIV ANTIBODY (ROUTINE TESTING W REFLEX): HIV Screen 4th Generation wRfx: NONREACTIVE

## 2023-07-14 NOTE — Assessment & Plan Note (Signed)
Paperwork received for preop clearance for scheduled right hip arthroplasty On eliquis for p Afib RCRI score 0 points with 3.9% risk of major cardiac event P: Medical clearance declined as his diabetes is uncontrolled When he is medically cleared he will need to hold eliquis 2 days prior to surgery

## 2023-07-14 NOTE — Assessment & Plan Note (Addendum)
Complicated urological history.  He has previously followed with [facility] which I am not able to see their notes.  He reports that he has had a urethral stricture in the past.  He has also had nephrolithiasis requiring lithotripsy last fall.  Recently seen in an urgent care with concerns for balanitis.  He was treated for a fungal infection.  Prior to going to urgent care he had urinary frequency, dysuria and penile irritation.  He feels like his symptoms are 95% better after taking antifungal medication.  He stated that he looked on the Internet for additional therapies and he was concerned that his urethral stricture had reoccurred.  He decided to use a metal rod to see if this helped with his urinary frequency.  He reported that this technique was called sounding and that he had used it to explore his anatomy to make sure his prostate was not the problem.  He also has urine dipstick test at home that he does daily. P: Referral to urology for recurrent UTI Dipstick with blood, negative for nitrites and leukocytes

## 2023-07-14 NOTE — Assessment & Plan Note (Signed)
Physical exam with tinea corporis on neck, back, and legs.  It is strange that he would suddenly develop systemic tinea corporis.  I did check CBC and HIV which were both reassuring for an intact immune system.  Received 1 dose of Diflucan last week but with severity of rash I think he needs further treatment P: Terbinafine to 250 mg daily for 7 days -HIV, CBC w diff

## 2023-07-14 NOTE — Assessment & Plan Note (Signed)
A1c from 7.3 in the summer to 9.4.  I repeated this for preop clearance for hip arthroplasty that has been scheduled for April.  I denied medical clearance with his uncontrolled diabetes.  In the past he has not tolerated metformin.  He is not a good candidate for SGLT2 with recurrent bladder infections.  I do think that he will need multiple agents to get his blood sugars under control and potentially needs insulin with his reduced option for p.o. medications. P: Ozempic 0.25 mg sent to pharmacy.  Patient hesitant to start this medication and is going to review this decision with his niece I shared with him that he may need to start insulin in the future Follow-up in 4 weeks

## 2023-07-15 ENCOUNTER — Encounter: Payer: Self-pay | Admitting: Internal Medicine

## 2023-07-18 NOTE — Progress Notes (Signed)
 Internal Medicine Clinic Attending  Case discussed with the resident at the time of the visit.  We reviewed the resident's history and exam and pertinent patient test results.  I agree with the assessment, diagnosis, and plan of care documented in the resident's note.

## 2023-07-27 ENCOUNTER — Encounter: Payer: Self-pay | Admitting: Dietician

## 2023-07-27 ENCOUNTER — Ambulatory Visit: Payer: Medicare Other | Admitting: Student

## 2023-07-27 VITALS — BP 124/86 | HR 102 | Temp 97.5°F | Ht 70.0 in | Wt 230.1 lb

## 2023-07-27 DIAGNOSIS — Z7985 Long-term (current) use of injectable non-insulin antidiabetic drugs: Secondary | ICD-10-CM

## 2023-07-27 DIAGNOSIS — E1169 Type 2 diabetes mellitus with other specified complication: Secondary | ICD-10-CM | POA: Diagnosis not present

## 2023-07-27 DIAGNOSIS — B351 Tinea unguium: Secondary | ICD-10-CM

## 2023-07-27 MED ORDER — TERBINAFINE HCL 250 MG PO TABS: 250.0000 mg | ORAL_TABLET | Freq: Every day | ORAL | 3 refills | Status: DC

## 2023-07-27 NOTE — Patient Instructions (Signed)
Thank you, [name] for allowing [place] to provide your care today. Today we discussed your fungal infection.I am sending you a 12-week prescription of terbinafine.  We will see you every 6 weeks to see how you are doing.  I have ordered the following labs for you:  Lab Orders  No laboratory test(s) ordered today     Tests ordered today:    Referrals ordered today:   Referral Orders  No referral(s) requested today     I have ordered the following medication/changed the following medications:   Stop the following medications: There are no discontinued medications.   Start the following medications: Meds ordered this encounter  Medications   terbinafine (LAMISIL) 250 MG tablet    Sig: Take 1 tablet (250 mg total) by mouth daily.    Dispense:  30 tablet    Refill:  3     Follow up: 2-3 months   Remember:   Should you have any questions or concerns please call the internal medicine clinic at [phone].    Kathleen Lime, M.D [facility]

## 2023-07-27 NOTE — Assessment & Plan Note (Addendum)
This patient presented to the office a couple of weeks ago with concerns for " abnormal nailbeds "that has been going on for quite some time.  Before that presentation ,he had been seen in outpatient urgent care and treated for tinea corporis with Diflucan which did not resolve his condition.  On presentation to our clinic, he was tried on terbinafine for 7 days and asked to return in 2 weeks for follow-up and also referred to podiatry for further assessment.  Seen today with a confirmed diagnosis from the podiatrist office for onychomycosis and was asked to come to his primary care for medication management.  Please refer to physical exam for an image of his nails.  I will treat this patient with a long course of terbinafine and set up a follow-up appointment in 4 to 6 weeks.  All medications and labs work reviewed and are reassuring that patient can be initiated on terbinafine.  Plan: - Prescribed 12 weeks of terbinafine 250 mg tablet - Follow-up in 4 to 6 weeks for liver enzymes check

## 2023-07-27 NOTE — Progress Notes (Signed)
CC: Onychomycosis  HPI:  Nicholas Lam is a 68 y.o. male living with a history stated below and presents today for onychomycosis.Please see problem based assessment and plan for additional details.  Past Medical History:  Diagnosis Date   History of DVT (deep vein thrombosis)    Left retinal detachment    Paroxysmal atrial fibrillation (HCC)    Type 2 diabetes mellitus (HCC)    Ureteral stone with hydronephrosis     Current Outpatient Medications on File Prior to Visit  Medication Sig Dispense Refill   apixaban (ELIQUIS) 5 MG TABS tablet Take 1 tablet (5 mg total) by mouth 2 (two) times daily. 180 tablet 5   Bromelains (BROMELAIN PO) Take by mouth.     Cholecalciferol 250 MCG (10000 UT) CAPS Take by mouth.     Multiple Vitamin (MULTI-VITAMIN) tablet Take 1 tablet by mouth daily.     nystatin-triamcinolone (MYCOLOG II) cream Apply topically 2 (two) times daily. 60 g 0   phenazopyridine (PYRIDIUM) 95 MG tablet Take 95 mg by mouth 3 (three) times daily as needed for pain.     Semaglutide,0.25 or 0.5MG /DOS, (OZEMPIC, 0.25 OR 0.5 MG/DOSE,) 2 MG/1.5ML SOPN Inject 0.25 mg into the skin once a week. 0.75 mL 3   No current facility-administered medications on file prior to visit.    No family history on file.  Social History   Socioeconomic History   Marital status: Single    Spouse name: Not on file   Number of children: Not on file   Years of education: Not on file   Highest education level: Not on file  Occupational History   Not on file  Tobacco Use   Smoking status: Never   Smokeless tobacco: Never  Substance and Sexual Activity   Alcohol use: Never   Drug use: Never   Sexual activity: Not on file  Other Topics Concern   Not on file  Social History Narrative   Not on file   Social Drivers of Health   Financial Resource Strain: Low Risk  (12/24/2022)   Received from [facility]   Overall Financial Resource Strain (CARDIA)     Difficulty of Paying Living Expenses: Not hard at all  Food Insecurity: No Food Insecurity (12/24/2022)   Received from [facility]   Hunger Vital Sign    Worried About Running Out of Food in the Last Year: Never true    Ran Out of Food in the Last Year: Never true  Transportation Needs: Unknown (12/24/2022)   Received from [facility] - Transportation    In the past 12 months, has lack of transportation kept you from medical appointments or from getting medications?: No    Lack of Transportation (Non-Medical): Not on file  Physical Activity: Inactive (07/12/2022)   Exercise Vital Sign    Days of Exercise per Week: 0 days    Minutes of Exercise per Session: 0 min  Stress: No Stress Concern Present (07/12/2022)   [employer] of Occupational Health - Occupational Stress Questionnaire    Feeling of Stress : Not at all  Social Connections: Moderately Isolated (07/12/2022)   Social Connection and Isolation Panel [NHANES]    Frequency of Communication with Friends and Family: More than three times a week    Frequency of Social Gatherings with Friends and Family: Once a week    Attends Religious Services: Never    Database administrator or Organizations: No  Attends Banker Meetings: 1 to 4 times per year    Marital Status: Divorced  Intimate Partner Violence: Not At Risk (07/12/2022)   Humiliation, Afraid, Rape, and Kick questionnaire    Fear of Current or Ex-Partner: No    Emotionally Abused: No    Physically Abused: No    Sexually Abused: No    Review of Systems: ROS negative except for what is noted on the assessment and plan.  Vitals:   07/27/23 1324 07/27/23 1359  BP: (!) 134/90 124/86  Pulse: (!) 104 (!) 102  Temp: (!) 97.5 F (36.4 C)   TempSrc: Oral   SpO2: 99%   Weight: 230 lb 1.6 oz (104.4 kg)   Height: 5\' 10"  (1.778 m)     Physical Exam: Constitutional: Well-appearing man, sitting in a wheelchair, in  no acute distress Cardiovascular: regular rate and rhythm, no m/r/g Pulmonary/Chest: normal work of breathing on room air, lungs clear to auscultation bilaterally Abdominal: soft, non-tender, non-distended MSK: normal bulk and tone Neurological: alert & oriented x 3, no focal deficit Skin: warm and dry.  Please refer to Dr. Mammie Lorenzo notes for images of skin    Psych: normal mood and behavior  Assessment & Plan:   Onychomycosis of multiple toenails with type 2 diabetes mellitus (HCC) This patient presented to the office a couple of weeks ago with concerns for " abnormal nailbeds "that has been going on for quite some time.  Before that presentation ,he had been seen in outpatient urgent care and treated for tinea corporis with Diflucan which did not resolve his condition.  On presentation to our clinic, he was tried on terbinafine for 7 days and asked to return in 2 weeks for follow-up and also referred to podiatry for further assessment.  Seen today with a confirmed diagnosis from the podiatrist office for onychomycosis and was asked to come to his primary care for medication management.  Please refer to physical exam for an image of his nails.  I will treat this patient with a long course of terbinafine and set up a follow-up appointment in 4 to 6 weeks.  All medications and labs work reviewed and are reassuring that patient can be initiated on terbinafine.  Plan: - Prescribed 12 weeks of terbinafine 250 mg tablet - Follow-up in 4 to 6 weeks for liver enzymes check   Patient discussed with Dr. Lavonna Monarch, M.D [facility] Phone: [phone] Date 07/27/2023 Time 5:06 PM

## 2023-08-04 NOTE — Progress Notes (Signed)
 Internal Medicine Clinic Attending  Case discussed with the resident at the time of the visit.  We reviewed the resident's history and exam and pertinent patient test results.  I agree with the assessment, diagnosis, and plan of care documented in the resident's note.  Systemic treatment chosen as patient cannot reach feet to apply topical preparations.

## 2023-08-08 ENCOUNTER — Telehealth: Payer: Self-pay

## 2023-08-08 NOTE — Telephone Encounter (Signed)
 RTC to patient states he has a UTI .  Was give Fluconazole before and the milky urine cleared up in a few days.  No fever of back pains.  Is have a lot of Urgency boarding lining on Incontinence.  Unable to come in as he is wheelchair bound and has no one to help him get here.

## 2023-08-08 NOTE — Telephone Encounter (Signed)
 Requesting to speak with a nurse about getting medication for UTI. Pt states he do not want to come in. He would like the doctor to call in the fluconazole for him. Please call pt back.

## 2023-08-09 ENCOUNTER — Telehealth: Payer: Self-pay | Admitting: *Deleted

## 2023-08-09 ENCOUNTER — Other Ambulatory Visit: Payer: Self-pay | Admitting: Student

## 2023-08-09 DIAGNOSIS — N390 Urinary tract infection, site not specified: Secondary | ICD-10-CM

## 2023-08-09 MED ORDER — FLUCONAZOLE 100 MG PO TABS
200.0000 mg | ORAL_TABLET | Freq: Every day | ORAL | 0 refills | Status: DC
Start: 2023-08-09 — End: 2023-08-24

## 2023-08-09 MED ORDER — FLUCONAZOLE 100 MG PO TABS: 200.0000 mg | ORAL_TABLET | Freq: Every day | ORAL | 0 refills | Status: DC

## 2023-08-09 NOTE — Telephone Encounter (Signed)
 Call from [employer] with [employer].  States directions on last prescription for Fluconazole for patient says to take 2 tablets by mouth daily for 14 days.  Dispense 30 tablets.  Per directions will only dispense 28 tablets.

## 2023-08-09 NOTE — Progress Notes (Signed)
 Called patient as he was having "Milky urine". He reports having similar symptoms at the ED earlier in July and was given two doses of fluconazole. He reports improvement of his symptoms after this but then this quickly came back. Pt unable to come in due to transportation issues, he is on an electronic wheelchair. Advised that he needs to come in for a culture preferably but due to his inability sending in a prescription for Candida UTI of 14 days of fluconazole 200mg . Pt is on terbinafine for onychomycosis, which he is willing to stop at this time to address his UTI. He is also on eliquis. Understands that he needs to follow up with [place] on the 12th of march for a FU appointment and address his UTI symptoms as well.

## 2023-08-10 ENCOUNTER — Ambulatory Visit: Payer: Medicare Other

## 2023-08-10 VITALS — Ht 70.0 in | Wt 230.0 lb

## 2023-08-10 DIAGNOSIS — Z0000 Encounter for general adult medical examination without abnormal findings: Secondary | ICD-10-CM | POA: Diagnosis not present

## 2023-08-10 NOTE — Progress Notes (Signed)
 Subjective:   Nicholas Lam is a 68 y.o. who presents for a Medicare Wellness preventive visit.  Visit Complete: Virtual I connected with  Nicholas Lam on 08/10/23 by a audio enabled telemedicine application and verified that I am speaking with the correct person using two identifiers.  Patient Location: Home  Provider Location: Office/Clinic  I discussed the limitations of evaluation and management by telemedicine. The patient expressed understanding and agreed to proceed.  Vital Signs: Because this visit was a virtual/telehealth visit, some criteria may be missing or patient reported. Any vitals not documented were not able to be obtained and vitals that have been documented are patient reported.  VideoDeclined- This patient declined Librarian, academic. Therefore the visit was completed with audio only.  AWV Questionnaire: No: Patient Medicare AWV questionnaire was not completed prior to this visit.  Cardiac Risk Factors include: advanced age (>69men, >9 women);diabetes mellitus;male gender;obesity (BMI >30kg/m2);sedentary lifestyle     Objective:    Today's Vitals   08/10/23 1425  Weight: 230 lb (104.3 kg)  Height: 5\' 10"  (1.778 m)  PainSc: 1   PainLoc: Groin   Body mass index is 33 kg/m.     08/10/2023    2:29 PM 12/20/2022    2:39 PM 10/07/2022    3:19 PM 07/12/2022   12:11 PM 07/08/2022    2:12 PM 12/01/2021   11:20 AM 11/22/2021    9:00 PM  Advanced Directives  Does Patient Have a Medical Advance Directive? Yes No No No No No No  Type of Advance Directive Living will        Does patient want to make changes to medical advance directive? No - Patient declined        Would patient like information on creating a medical advance directive?  No - Patient declined No - Patient declined No - Patient declined No - Patient declined Yes (ED - Information included in AVS) No - Patient declined    Current Medications  (verified) Outpatient Encounter Medications as of 08/10/2023  Medication Sig   apixaban (ELIQUIS) 5 MG TABS tablet Take 1 tablet (5 mg total) by mouth 2 (two) times daily.   Bromelains (BROMELAIN PO) Take by mouth.   Cholecalciferol 250 MCG (10000 UT) CAPS Take by mouth.   fluconazole (DIFLUCAN) 100 MG tablet Take 2 tablets (200 mg total) by mouth daily for 14 days.   Multiple Vitamin (MULTI-VITAMIN) tablet Take 1 tablet by mouth daily.   nystatin-triamcinolone (MYCOLOG II) cream Apply topically 2 (two) times daily.   phenazopyridine (PYRIDIUM) 95 MG tablet Take 95 mg by mouth 3 (three) times daily as needed for pain.   Semaglutide,0.25 or 0.5MG /DOS, (OZEMPIC, 0.25 OR 0.5 MG/DOSE,) 2 MG/1.5ML SOPN Inject 0.25 mg into the skin once a week.   No facility-administered encounter medications on file as of 08/10/2023.    Allergies (verified) Iodinated contrast media and Iodine   History: Past Medical History:  Diagnosis Date   History of DVT (deep vein thrombosis)    Left retinal detachment    Paroxysmal atrial fibrillation (HCC)    Type 2 diabetes mellitus (HCC)    Ureteral stone with hydronephrosis    Past Surgical History:  Procedure Laterality Date   CORONARY ULTRASOUND/IVUS Bilateral 11/24/2021   Procedure: Intravascular Ultrasound/IVUS;  Surgeon: Nada Libman, MD;  Location: [facility] INVASIVE [facility];  Service: Cardiovascular;  Laterality: Bilateral;  IVC BIL LOWER EXT   LOWER EXTREMITY VENOGRAPHY Bilateral 11/24/2021   Procedure: LOWER  EXTREMITY VENOGRAPHY;  Surgeon: Nada Libman, MD;  Location: [facility]VAS[facility] CV [facility];  Service: Cardiovascular;  Laterality: Bilateral;   PERIPHERAL VASCULAR BALLOON ANGIOPLASTY Bilateral 11/24/2021   Procedure: PERIPHERAL VASCULAR BALLOON ANGIOPLASTY;  Surgeon: Nada Libman, MD;  Location: [facility]VAS[facility] CV [facility];  Service: Cardiovascular;  Laterality: Bilateral;   PERIPHERAL VASCULAR THROMBECTOMY Bilateral 11/24/2021   Procedure: PERIPHERAL VASCULAR  THROMBECTOMY;  Surgeon: Nada Libman, MD;  Location: [facility]AS[facility] CV [facility];  Service: Cardiovascular;  Laterality: Bilateral;  LOWER EXT   No family history on file. Social History   Socioeconomic History   Marital status: Single    Spouse name: Not on file   Number of children: Not on file   Years of education: Not on file   Highest education level: Not on file  Occupational History   Not on file  Tobacco Use   Smoking status: Never   Smokeless tobacco: Never  Substance and Sexual Activity   Alcohol use: Never   Drug use: Never   Sexual activity: Not on file  Other Topics Concern   Not on file  Social History Narrative   Not on file   Social Drivers of Health   Financial Resource Strain: Low Risk  (08/10/2023)   Overall Financial Resource Strain (CARDIA)    Difficulty of Paying Living Expenses: Not hard at all  Food Insecurity: No Food Insecurity (08/10/2023)   Hunger Vital Sign    Worried About Running Out of Food in the Last Year: Never true    Ran Out of Food in the Last Year: Never true  Transportation Needs: Unmet Transportation Needs (08/10/2023)   PRAPARE - Transportation    Lack of Transportation (Medical): Yes    Lack of Transportation (Non-Medical): Yes  Physical Activity: Inactive (08/10/2023)   Exercise Vital Sign    Days of Exercise per Week: 0 days    Minutes of Exercise per Session: 0 min  Stress: No Stress Concern Present (08/10/2023)   [employer] of Occupational Health - Occupational Stress Questionnaire    Feeling of Stress : Not at all  Social Connections: Moderately Isolated (08/10/2023)   Social Connection and Isolation Panel [NHANES]    Frequency of Communication with Friends and Family: More than three times a week    Frequency of Social Gatherings with Friends and Family: Once a week    Attends Religious Services: Never    Database administrator or Organizations: No    Attends Engineer, structural: 1 to 4 times per year     Marital Status: Divorced    Tobacco Counseling Counseling given: Not Answered    Clinical Intake:  Pre-visit preparation completed: Yes  Pain : 0-10 Pain Score: 1  (discomfort) Pain Type: Acute pain     BMI - recorded: 33 Nutritional Status: BMI > 30  Obese Nutritional Risks: None Diabetes: Yes CBG done?: No Did pt. bring in CBG monitor from home?: No  How often do you need to have someone help you when you read instructions, pamphlets, or other written materials from your doctor or pharmacy?: 1 - Never What is the last grade level you completed in school?: HSG  Interpreter Needed?: No  Information entered by :: Susie Cassette, LPN.   Activities of Daily Living     08/10/2023    2:39 PM 12/20/2022    2:38 PM  In your present state of health, do you have any difficulty performing the following activities:  Hearing? 1 0  Vision?  0 0  Difficulty concentrating or making decisions? 1 0  Comment name recall or places   Walking or climbing stairs? 1 1  Comment  pain  Dressing or bathing? 0 0  Doing errands, shopping? 0 0  Comment  friend  Quarry manager and eating ? N   Using the Toilet? N   In the past six months, have you accidently leaked urine? Y   Do you have problems with loss of bowel control? N   Managing your Medications? N   Managing your Finances? N   Housekeeping or managing your Housekeeping? N     Patient Care Team: Marrianne Mood, MD as PCP - General  Indicate any recent Medical Services you may have received from other than [facility] providers in the past year (date may be approximate).     Assessment:   This is a routine wellness examination for Nicholas Lam.  Hearing/Vision screen Hearing Screening - Comments:: Patient has hearing difficulties. Patient has hearing aids.   Vision Screening - Comments:: Wears rx glasses for driving - not up to date with routine eye exams over 5 years ago.     Goals Addressed             This Visit's  Progress    My healthcare goal is to have hip surgery in April, but I need to be able to monitor and control my diabetes.  I want to be able to walk and get back to exercising.         Depression Screen     08/10/2023    2:36 PM 12/20/2022    2:47 PM 10/07/2022    3:21 PM 07/12/2022   12:11 PM 07/08/2022    2:14 PM 12/01/2021   11:19 AM  PHQ 2/9 Scores  PHQ - 2 Score 0 0 0 2 2 0  PHQ- 9 Score 3  3 7 7      Fall Risk     08/10/2023    2:33 PM 12/20/2022    2:38 PM 10/07/2022    3:19 PM 07/12/2022   12:11 PM 07/08/2022    2:13 PM  Fall Risk   Falls in the past year? 0 0 1 1 1   Comment   LAST YEAR    Number falls in past yr: 0 0 1 1 1   Injury with Fall? 0 0 0 1 1  Risk for fall due to : No Fall Risks History of fall(s);Impaired balance/gait Impaired vision;Impaired balance/gait Impaired balance/gait Impaired balance/gait  Follow up Falls prevention discussed;Falls evaluation completed Falls evaluation completed;Falls prevention discussed Falls evaluation completed Falls evaluation completed;Falls prevention discussed Falls evaluation completed    MEDICARE RISK AT HOME:  Medicare Risk at Home Any stairs in or around the home?: No Home free of loose throw rugs in walkways, pet beds, electrical cords, etc?: Yes Adequate lighting in your home to reduce risk of falls?: Yes Life alert?: Yes (Patient uses SNUG life alert; which is timed and patient has to check in at certain times.) Use of a cane, walker or w/c?: Yes (electric wheelchair) Grab bars in the bathroom?: No Shower chair or bench in shower?: No (shower stool that is in the garage) Elevated toilet seat or a handicapped toilet?: Yes  TIMED UP AND GO:  Was the test performed?  No  Cognitive Function: 6CIT completed    08/10/2023    2:35 PM  MMSE - Mini Mental State Exam  Not completed: Unable to complete  08/10/2023    2:35 PM 07/12/2022   12:12 PM  6CIT Screen  What Year? 0 points 0 points  What month? 0 points 0  points  What time? 0 points 0 points  Count back from 20 0 points 0 points  Months in reverse 0 points 0 points  Repeat phrase 0 points 0 points  Total Score 0 points 0 points    Immunizations Immunization History  Administered Date(s) Administered   Janssen (J&J) SARS-COV-2 Vaccination 10/13/2019    Screening Tests Health Maintenance  Topic Date Due   Pneumonia Vaccine 64+ Years old (1 of 2 - PCV) Never done   DTaP/Tdap/Td (1 - Tdap) Never done   Colonoscopy  Never done   Zoster Vaccines- Shingrix (1 of 2) Never done   OPHTHALMOLOGY EXAM  11/26/2017   INFLUENZA VACCINE  Never done   COVID-19 Vaccine (2 - 2024-25 season) 02/13/2023   Diabetic kidney evaluation - eGFR measurement  10/07/2023   Diabetic kidney evaluation - Urine ACR  12/20/2023   HEMOGLOBIN A1C  01/10/2024   FOOT EXAM  07/12/2024   Medicare Annual Wellness (AWV)  08/09/2024   Hepatitis C Screening  Completed   HPV VACCINES  Aged Out    Health Maintenance  Health Maintenance Due  Topic Date Due   Pneumonia Vaccine 41+ Years old (1 of 2 - PCV) Never done   DTaP/Tdap/Td (1 - Tdap) Never done   Colonoscopy  Never done   Zoster Vaccines- Shingrix (1 of 2) Never done   OPHTHALMOLOGY EXAM  11/26/2017   INFLUENZA VACCINE  Never done   COVID-19 Vaccine (2 - 2024-25 season) 02/13/2023   Health Maintenance Items Addressed:Yes Patient declined vaccinations and colonoscopy.  Additional Screening:  Vision Screening: Recommended annual ophthalmology exams for early detection of glaucoma and other disorders of the eye.  Dental Screening: Recommended annual dental exams for proper oral hygiene  Community Resource Referral / Chronic Care Management: CRR required this visit?  No   CCM required this visit?  No     Plan:     I have personally reviewed and noted the following in the patient's chart:   Medical and social history Use of alcohol, tobacco or illicit drugs  Current medications and supplements  including opioid prescriptions. Patient is not currently taking opioid prescriptions. Functional ability and status Nutritional status Physical activity Advanced directives List of other physicians Hospitalizations, surgeries, and ER visits in previous 12 months Vitals Screenings to include cognitive, depression, and falls Referrals and appointments  In addition, I have reviewed and discussed with patient certain preventive protocols, quality metrics, and best practice recommendations. A written personalized care plan for preventive services as well as general preventive health recommendations were provided to patient.     Mickeal Needy, LPN   09/20/8117   After Visit Summary: (MyChart) Due to this being a telephonic visit, the after visit summary with patients personalized plan was offered to patient via MyChart   Notes:  Patient declined Presenter, broadcasting.   Patient would like to be assigned to an attending for his care.

## 2023-08-10 NOTE — Patient Instructions (Addendum)
 Mr. Nicholas Lam , Thank you for taking time to come for your Medicare Wellness Visit. I appreciate your ongoing commitment to your health goals. Please review the following plan we discussed and let me know if I can assist you in the future.   Referrals/Orders/Follow-Ups/Clinician Recommendations: Yes; Keep maintaining your health by keeping your appointments with Dr. Benito Mccreedy and any specialists that you may see.  Call us if you need anything.  Have a great year!!!!  This is a list of the screening recommended for you and due dates:  Health Maintenance  Topic Date Due   Pneumonia Vaccine (1 of 2 - PCV) Never done   DTaP/Tdap/Td vaccine (1 - Tdap) Never done   Colon Cancer Screening  Never done   Zoster (Shingles) Vaccine (1 of 2) Never done   Eye exam for diabetics  11/26/2017   Flu Shot  Never done   COVID-19 Vaccine (2 - 2024-25 season) 02/13/2023   Yearly kidney function blood test for diabetes  10/07/2023   Yearly kidney health urinalysis for diabetes  12/20/2023   Hemoglobin A1C  01/10/2024   Complete foot exam   07/12/2024   Medicare Annual Wellness Visit  08/09/2024   Hepatitis C Screening  Completed   HPV Vaccine  Aged Out    Advanced directives: (Declined) Advance directive discussed with you today. Even though you declined this today, please call our office should you change your mind, and we can give you the proper paperwork for you to fill out.  Next Medicare Annual Wellness Visit scheduled for next year: No

## 2023-08-11 ENCOUNTER — Other Ambulatory Visit (HOSPITAL_COMMUNITY): Payer: Self-pay

## 2023-08-11 ENCOUNTER — Encounter: Payer: Medicare Other | Admitting: Student

## 2023-08-11 ENCOUNTER — Telehealth: Payer: Self-pay | Admitting: *Deleted

## 2023-08-11 NOTE — Progress Notes (Signed)
 Complex Care Management Note Care Guide Note  08/11/2023 Name: Nicholas Lam MRN: [id number] DOB: March 13, 1956  [name] is a 68 y.o. year old male who is a primary care patient of Marrianne Mood, MD . The community resource team was consulted for assistance with Transportation Needs   SDOH screenings and interventions completed:  Yes  Social Drivers of Health From This Encounter   Transportation Needs: No Transportation Needs (08/11/2023)   PRAPARE - Administrator, Civil Service (Medical): No    Lack of Transportation (Non-Medical): No    SDOH Interventions Today    Flowsheet Row Most Recent Value  SDOH Interventions   Transportation Interventions SCAT (Specialized Community Area Transporation), Community Resources Provided  [patient has [employer] and only needs some Inofrmation about out of county]        Care guide performed the following interventions: Patient provided with information about care guide support team and interviewed to confirm resource needs.  Follow Up Plan:   Will reach out after I find out if [place] transportation will transportation over the county line    Encounter Outcome:  Patient Visit Completed  Dione Booze  [facility] [place] [facility] Guide  Direct Dial:[phone] Fax:[phone] Website: [email or web address]

## 2023-08-12 ENCOUNTER — Telehealth: Payer: Self-pay

## 2023-08-12 ENCOUNTER — Other Ambulatory Visit: Payer: Self-pay

## 2023-08-12 ENCOUNTER — Other Ambulatory Visit (HOSPITAL_COMMUNITY): Payer: Self-pay

## 2023-08-12 MED ORDER — OZEMPIC (0.25 OR 0.5 MG/DOSE) 2 MG/3ML ~~LOC~~ SOPN
0.2500 mg | PEN_INJECTOR | SUBCUTANEOUS | 3 refills | Status: DC
  Filled 2023-08-12: qty 3, 56d supply, fill #0
  Filled 2023-08-16: qty 3, 28d supply, fill #0

## 2023-08-12 NOTE — Telephone Encounter (Signed)
 Nicholas Lam (Key: [id number]) Rx #: [id number] Ozempic (0.25 or 0.5 MG/DOSE) 2MG [name] pen-injectors Form [employer] Electronic Prior Authorization Request Form [phone] NCPDP) Message from Plan Approved. This drug has been approved under the Member's Medicare Part D benefit. Approved quantity: 3 units per 28 day(s). You may fill up to a 90 day supply except for those on Specialty Tier 5, which can be filled up to a 30 day supply. Please call the pharmacy to process the prescription claim.. Authorization Expiration Date: June 13, 2098.

## 2023-08-12 NOTE — Telephone Encounter (Signed)
 Prior Authorization for patient (Ozempic (0.25 or 0.5 MG/DOSE) 2MG /3ML pen-injectors) came through on cover my meds was submitted with last office notes and labs awaiting approval or denial.  ZOX:[id number]

## 2023-08-16 ENCOUNTER — Other Ambulatory Visit (HOSPITAL_COMMUNITY): Payer: Self-pay

## 2023-08-16 ENCOUNTER — Other Ambulatory Visit: Payer: Self-pay

## 2023-08-24 ENCOUNTER — Ambulatory Visit: Payer: Medicare Other | Admitting: Student

## 2023-08-24 ENCOUNTER — Encounter: Payer: Self-pay | Admitting: Student

## 2023-08-24 VITALS — BP 127/85 | HR 114 | Temp 97.5°F | Ht 70.0 in | Wt 227.0 lb

## 2023-08-24 DIAGNOSIS — E1140 Type 2 diabetes mellitus with diabetic neuropathy, unspecified: Secondary | ICD-10-CM

## 2023-08-24 DIAGNOSIS — M1651 Unilateral post-traumatic osteoarthritis, right hip: Secondary | ICD-10-CM

## 2023-08-24 DIAGNOSIS — L210 Seborrhea capitis: Secondary | ICD-10-CM

## 2023-08-24 DIAGNOSIS — Z7985 Long-term (current) use of injectable non-insulin antidiabetic drugs: Secondary | ICD-10-CM | POA: Diagnosis not present

## 2023-08-24 DIAGNOSIS — N390 Urinary tract infection, site not specified: Secondary | ICD-10-CM | POA: Diagnosis present

## 2023-08-24 DIAGNOSIS — Z0000 Encounter for general adult medical examination without abnormal findings: Secondary | ICD-10-CM

## 2023-08-24 LAB — POCT GLYCOSYLATED HEMOGLOBIN (HGB A1C): Hemoglobin A1C: 8.6 % — AB (ref 4.0–5.6)

## 2023-08-24 LAB — GLUCOSE, CAPILLARY: Glucose-Capillary: 169 mg/dL — ABNORMAL HIGH (ref 70–99)

## 2023-08-24 MED ORDER — SEMAGLUTIDE (1 MG/DOSE) 4 MG/3ML ~~LOC~~ SOPN: 1.0000 mg | PEN_INJECTOR | SUBCUTANEOUS | 0 refills | Status: DC

## 2023-08-24 MED ORDER — FLUCONAZOLE 100 MG PO TABS: 200.0000 mg | ORAL_TABLET | Freq: Every day | ORAL | 1 refills | Status: DC

## 2023-08-24 MED ORDER — FLUCONAZOLE 100 MG PO TABS: 100.0000 mg | ORAL_TABLET | Freq: Every day | ORAL | 3 refills | Status: DC

## 2023-08-24 MED ORDER — SEMAGLUTIDE (1 MG/DOSE) 4 MG/3ML ~~LOC~~ SOPN: 1.0000 mg | PEN_INJECTOR | SUBCUTANEOUS | 0 refills | Status: AC

## 2023-08-24 NOTE — Patient Instructions (Addendum)
 Mr Mindel,  For now, plan to take Fluconazole 100 daily until your urology appointment. If symptoms recur on this reduced dose, please call the clinic and we can return to the 200 daily dose.  I will increase your ozempic to 1mg  weekly. Congratulations on making progress with your A1c!  Please return in 4 weeks for a checkup of the above.  Best regards, Dr. Ninfa Meeker

## 2023-08-24 NOTE — Assessment & Plan Note (Signed)
 He notices mild dandruff. Rec selenium sulfide shampoo.

## 2023-08-24 NOTE — Assessment & Plan Note (Signed)
 Chart says he is due for colonoscopy however he does Cologuard. Most recent test in January 2024 was negative.  He will need another Cologuard in January 2027.

## 2023-08-24 NOTE — Assessment & Plan Note (Addendum)
 Recent A1c of 9.4, repeated today per patient request and is now 8.6.  He is undergoing titration of Ozempic, currently at 0.5 mg weekly and doing very well without noticed side effects, he has even lost about 5 pounds of weight (presently 227lbs).  Will further increase his dose.  He is motivated to get his A1c down as he is planning hip surgery at the end of February and needs his A1c to be below 7.5. - Increase Ozempic to 1 mg weekly, can further maximize dose in 1 month.

## 2023-08-24 NOTE — Progress Notes (Addendum)
 CC: Recurrent UTI, diabetes follow-up  HPI:  Nicholas Lam is a 68 y.o. male living with a history stated below and presents today for evaluation of chronic conditions. Please see problem based assessment and plan for additional details.  Past Medical History:  Diagnosis Date   History of DVT (deep vein thrombosis)    Left retinal detachment    Paroxysmal atrial fibrillation (HCC)    Type 2 diabetes mellitus (HCC)    Ureteral stone with hydronephrosis     Review of Systems: ROS negative except for what is noted on the assessment and plan.  Vitals:   08/24/23 1319  BP: 127/85  Pulse: (!) 114  Temp: (!) 97.5 F (36.4 C)  TempSrc: Oral  SpO2: 91%  Weight: 227 lb (103 kg)  Height: 5\' 10"  (1.778 m)    Physical Exam: Constitutional: well-appearing man sitting in wheelchair due to OA in no acute distress Eyes: conjunctiva non-erythematous. Central strabisumus of L eye. Cardiovascular: regular rate and rhythm, no m/r/g Pulmonary/Chest: normal work of breathing on room air, lungs clear to auscultation bilaterally MSK: normal bulk and tone. Neurological: alert & oriented x 3 Skin: warm and dry. Mild skin irritation at R shin with well-healing excoriation where he picked hair, does not appear infected. Psych: normal mood and behavior  Assessment & Plan:   Patient discussed with Nicholas Lam  Recurrent UTI Nicholas Lam has a full urologic history including previous iatrogenic ureteral perforation, percutaneous nephrostomy, urethral stricture, and is now undergoing evaluation for recurrent candidal UTIs.  See urine culture from January of this year.  He is now on his third course of fluconazole this year, having responded well to previous stents of therapy but with symptoms recurring about 1 week after stopping the medicine.  Primary symptoms are urinary discoloration, incontinence, frequency.  As he is completing his third course of fluconazole, he is without symptoms at  present but very concerned that they will recur.  Previously he had been referred to urology, however he would prefer to go to a group that is not alliance. - Extended therapy is now indicated, so will treat with fluconazole 100 daily indefinitely until he can be evaluated by urology.  I have asked him to call the clinic if his symptoms recur since I am reducing his daily dose from 200-100.  I do believe 200 would be reasonable if needed. -Previous workup for immune deficiency was negative.  His history of structural urologic issues as well severe diabetes are risk factors.  SGLT2 inhibitors avoided for this reason. - Refer to urology, to a group that is not alliance, per patient request.  Diabetes mellitus with diabetic neuropathy (HCC) Recent A1c of 9.4, repeated today per patient request and is now 8.6.  He is undergoing titration of Ozempic, currently at 0.5 mg weekly and doing very well without noticed side effects, he has even lost about 5 pounds of weight (presently 227lbs).  Will further increase his dose.  He is motivated to get his A1c down as he is planning hip surgery at the end of February and needs his A1c to be below 7.5. - Increase Ozempic to 1 mg weekly, can further maximize dose in 1 month.  Healthcare maintenance Chart says he is due for colonoscopy however he does Cologuard. Most recent test in January 2024 was negative.  He will need another Cologuard in January 2027.  Dandruff He notices mild dandruff. Rec selenium sulfide shampoo.  RTC in 1 month to assess UTI having  started maintenance fluconazole and placed urology referral.  At that time will do diabetes check: please see if further ozempic titration needed. He will want his A1c checked in preparation for his surgery in late April. Please assess if remains tachycardic.  ADD: On 4/1 via phone we discussed a recommendation to start statin therapy given elevated ASCVD risk with diabetes. He strongly prefers to avoid this class  of medicine and so we will not start this therapy. He reports that his titration of ozempic is going well and he has no recurrence of UTI on maintenance fluconazole. He is looking forward to an appointment with Nicholas Lam next week.  Katheran James, D.O. [facility] Internal Medicine, PGY-1 Phone: [phone] Date 08/24/2023 Time 2:46 PM

## 2023-08-24 NOTE — Assessment & Plan Note (Addendum)
 Nicholas Lam has a full urologic history including previous iatrogenic ureteral perforation, percutaneous nephrostomy, urethral stricture, and is now undergoing evaluation for recurrent candidal UTIs.  See urine culture from January of this year.  He is now on his third course of fluconazole this year, having responded well to previous stents of therapy but with symptoms recurring about 1 week after stopping the medicine.  Primary symptoms are urinary discoloration, incontinence, frequency.  As he is completing his third course of fluconazole, he is without symptoms at present but very concerned that they will recur.  Previously he had been referred to urology, however he would prefer to go to a group that is not alliance. - Extended therapy is now indicated, so will treat with fluconazole 100 daily indefinitely until he can be evaluated by urology.  I have asked him to call the clinic if his symptoms recur since I am reducing his daily dose from 200-100.  I do believe 200 would be reasonable if needed. -Previous workup for immune deficiency was negative.  His history of structural urologic issues as well severe diabetes are risk factors.  SGLT2 inhibitors avoided for this reason. - Refer to urology, to a group that is not alliance, per patient request.

## 2023-08-25 ENCOUNTER — Other Ambulatory Visit: Payer: Self-pay | Admitting: Student

## 2023-08-25 ENCOUNTER — Telehealth: Payer: Self-pay

## 2023-08-25 DIAGNOSIS — N390 Urinary tract infection, site not specified: Secondary | ICD-10-CM

## 2023-08-25 NOTE — Telephone Encounter (Signed)
 Copied from CRM [id number]. Topic: Appointments - Scheduling Inquiry for Clinic >> Aug 24, 2023  2:42 PM Nicholas Lam wrote: Reason for CRM: Patient is needing a follow up appointment sooner than 10/17/2023 due to having surgery. Patient would like a call back at [phone] (M)

## 2023-08-25 NOTE — Telephone Encounter (Signed)
 Correct medicine per doctor progress note is fluconazole 100 mg daily

## 2023-08-25 NOTE — Telephone Encounter (Signed)
 Called and spoke with pt, scheduled his appt for 09/21/2023 @ 1:15pm with Dr. August Saucer.

## 2023-08-31 NOTE — Progress Notes (Signed)
 Internal Medicine Clinic Attending  Case discussed with the resident at the time of the visit.  We reviewed the resident's history and exam and pertinent patient test results.  I agree with the assessment, diagnosis, and plan of care documented in the resident's note. F/u elevated HR at next visit.

## 2023-09-21 ENCOUNTER — Ambulatory Visit: Admitting: Internal Medicine

## 2023-09-21 VITALS — BP 131/90 | HR 92 | Temp 97.9°F | Ht 70.0 in | Wt 221.0 lb

## 2023-09-21 DIAGNOSIS — E1140 Type 2 diabetes mellitus with diabetic neuropathy, unspecified: Secondary | ICD-10-CM | POA: Diagnosis present

## 2023-09-21 DIAGNOSIS — N390 Urinary tract infection, site not specified: Secondary | ICD-10-CM | POA: Diagnosis not present

## 2023-09-21 DIAGNOSIS — Z7985 Long-term (current) use of injectable non-insulin antidiabetic drugs: Secondary | ICD-10-CM | POA: Diagnosis not present

## 2023-09-21 LAB — GLUCOSE, CAPILLARY: Glucose-Capillary: 165 mg/dL — ABNORMAL HIGH (ref 70–99)

## 2023-09-21 LAB — POCT GLYCOSYLATED HEMOGLOBIN (HGB A1C): Hemoglobin A1C: 7.6 % — AB (ref 4.0–5.6)

## 2023-09-21 MED ORDER — OZEMPIC (2 MG/DOSE) 8 MG/3ML ~~LOC~~ SOPN: 2.0000 mg | PEN_INJECTOR | SUBCUTANEOUS | 0 refills | Status: DC

## 2023-09-21 NOTE — Patient Instructions (Signed)
 Nicholas Lam,  It was a pleasure to care for you today.  Your HbA1c has improved to 7.6%.  I am increasing your ozempic dose to 2 mg injection once weekly.  Please follow up in about 1 month so that we can ensure you are doing well on your increased ozempic dose.  I will let you know if your urine protein studies are abnormal.  My best, Dr. August Saucer

## 2023-09-21 NOTE — Assessment & Plan Note (Signed)
 HbA1c last checked 08/2023 8.6%, today 7.6%. OP regimen is ozempic 1 mg weekly. He does check his blood sugar at home and typically runs in the 120s-160s fasting. Urine microalbumin/creatinine ratio last checked 12/2022 WNL. Last diabetic eye exam was a very long time ago. Plan: Will increase ozempic to 2 mg weekly. Will check urine protein/creatinine ratio today. Referral to ophthalmology placed for annual diabetic eye exam. Plan for follow-up in about 1 month.

## 2023-09-21 NOTE — Assessment & Plan Note (Signed)
 Patient started on maintenance fluconazole 100 mg daily pending establishing care with urology which he has been compliant with. He reports symptoms today of some urethral discomfort/irritation. He has not received a call yet to set up his initial appointment with [facility], which is where I see the referral was sent to 08/24/2023. Plan: Will follow up on status of referral that was placed.

## 2023-09-21 NOTE — Progress Notes (Signed)
     CC: would like recheck of HbA1c  HPI:  Nicholas Lam is a 67 y.o. male with past medical history as detailed below who presents for recheck of HbA1c. Please see problem based charting for detailed assessment and plan.  Past Medical History:  Diagnosis Date   History of DVT (deep vein thrombosis)    Left retinal detachment    Paroxysmal atrial fibrillation (HCC)    Type 2 diabetes mellitus (HCC)    Ureteral stone with hydronephrosis    Review of Systems:  Negative unless otherwise stated.  Physical Exam:  Vitals:   09/21/23 1309  BP: (!) 131/90  Pulse: 92  Temp: 97.9 F (36.6 C)  TempSrc: Oral  SpO2: 96%  Weight: 221 lb (100.2 kg)  Height: 5\' 10"  (1.778 m)   Constitutional:Pleasant gentleman in NAD. Cardio:Regular rate and rhythm. No murmurs, rubs, or gallops. Pulm:Clear to auscultation bilaterally. Normal work of breathing on room air. ZOX:WRUEAVWU for extremity edema. Skin:Warm and dry. Neuro:Alert and oriented x3. No focal deficit noted. Psych:Pleasant mood and affect.  Assessment & Plan:   See Encounters Tab for problem based charting.  Recurrent UTI Patient started on maintenance fluconazole 100 mg daily pending establishing care with urology which he has been compliant with. He reports symptoms today of some urethral discomfort/irritation. He has not received a call yet to set up his initial appointment with [facility], which is where I see the referral was sent to 08/24/2023. Plan: Will follow up on status of referral that was placed.  Diabetes mellitus with diabetic neuropathy (HCC) HbA1c last checked 08/2023 8.6%, today 7.6%. OP regimen is ozempic 1 mg weekly. He does check his blood sugar at home and typically runs in the 120s-160s fasting. Urine microalbumin/creatinine ratio last checked 12/2022 WNL. Last diabetic eye exam was a very long time ago. Plan: Will increase ozempic to 2 mg weekly. Will check urine protein/creatinine  ratio today. Referral to ophthalmology placed for annual diabetic eye exam. Plan for follow-up in about 1 month.  Patient discussed with Dr.  Frances Furbish

## 2023-09-22 ENCOUNTER — Encounter: Payer: Self-pay | Admitting: Internal Medicine

## 2023-09-22 LAB — MICROALBUMIN / CREATININE URINE RATIO
Creatinine, Urine: 236.1 mg/dL
Microalb/Creat Ratio: 12 mg/g{creat} (ref 0–29)
Microalbumin, Urine: 28.5 ug/mL

## 2023-09-27 NOTE — Patient Instructions (Signed)
 DUE TO COVID-19 ONLY TWO VISITORS  (aged 70 and older)  ARE ALLOWED TO COME WITH YOU AND STAY IN THE WAITING ROOM ONLY DURING PRE OP AND PROCEDURE.   **NO VISITORS ARE ALLOWED IN THE SHORT STAY AREA OR RECOVERY ROOM!!**  IF YOU WILL BE ADMITTED INTO THE HOSPITAL YOU ARE ALLOWED ONLY FOUR SUPPORT PEOPLE DURING VISITATION HOURS ONLY (7 AM -8PM)   The support person(s) must pass our screening, gel in and out, and wear a mask at all times, including in the patient's room. Patients must also wear a mask when staff or their support person are in the room. Visitors GUEST BADGE MUST BE WORN VISIBLY  One adult visitor may remain with you overnight and MUST be in the room by 8 P.M.     Your procedure is scheduled on: 10/11/23   Report to [facility] Main Entrance    Report to admitting at : 6:10 AM   Call this number if you have problems the morning of surgery [phone]   Do not eat food :After Midnight.   After Midnight you may have the following liquids until : 5:30 AM DAY OF SURGERY  Water Black Coffee (sugar ok, NO MILK/CREAM OR CREAMERS)  Tea (sugar ok, NO MILK/CREAM OR CREAMERS) regular and decaf                             Plain Jell-O (NO RED)                                           Fruit ices (not with fruit pulp, NO RED)                                     Popsicles (NO RED)                                                                  Juice: apple, WHITE grape, WHITE cranberry Sports drinks like Gatorade (NO RED)   The day of surgery:  Drink ONE (1) Pre-Surgery Clear G2 at : 5:30 AM the morning of surgery. Drink in one sitting. Do not sip.  This drink was given to you during your hospital  pre-op appointment visit. Nothing else to drink after completing the  Pre-Surgery Clear Ensure or G2.          If you have questions, please contact your surgeon's office.  FOLLOW ANY ADDITIONAL PRE OP INSTRUCTIONS YOU RECEIVED FROM YOUR SURGEON'S OFFICE!!!   Oral Hygiene  is also important to reduce your risk of infection.                                    Remember - BRUSH YOUR TEETH THE MORNING OF SURGERY WITH YOUR REGULAR TOOTHPASTE  DENTURES WILL BE REMOVED PRIOR TO SURGERY PLEASE DO NOT APPLY "Poly grip" OR ADHESIVES!!!   Do NOT smoke after Midnight   Take these medicines the morning of surgery  with A SIP OF WATER: diflucan.Tylenol as needed.  STOP TAKING all Vitamins, Herbs and supplements 1 week before your surgery.   How to Manage Your Diabetes Before and After Surgery  Why is it important to control my blood sugar before and after surgery? Improving blood sugar levels before and after surgery helps healing and can limit problems. A way of improving blood sugar control is eating a healthy diet by:  Eating less sugar and carbohydrates  Increasing activity/exercise  Talking with your doctor about reaching your blood sugar goals High blood sugars (greater than 180 mg/dL) can raise your risk of infections and slow your recovery, so you will need to focus on controlling your diabetes during the weeks before surgery. Make sure that the doctor who takes care of your diabetes knows about your planned surgery including the date and location.  How do I manage my blood sugar before surgery? Check your blood sugar at least 4 times a day, starting 2 days before surgery, to make sure that the level is not too high or low. Check your blood sugar the morning of your surgery when you wake up and every 2 hours until you get to the Short Stay unit. If your blood sugar is less than 70 mg/dL, you will need to treat for low blood sugar: Do not take insulin. Treat a low blood sugar (less than 70 mg/dL) with  cup of clear juice (cranberry or apple), 4 glucose tablets, OR glucose gel. Recheck blood sugar in 15 minutes after treatment (to make sure it is greater than 70 mg/dL). If your blood sugar is not greater than 70 mg/dL on recheck, call [phone] for further  instructions. Report your blood sugar to the short stay nurse when you get to Short Stay.  If you are admitted to the hospital after surgery: Your blood sugar will be checked by the staff and you will probably be given insulin after surgery (instead of oral diabetes medicines) to make sure you have good blood sugar levels. The goal for blood sugar control after surgery is 80-180 mg/dL.   WHAT DO I DO ABOUT MY DIABETES MEDICATION?    THE MORNING OF SURGERY, DO NOT TAKE ANY ORAL DIABETIC MEDICATIONS DAY OF YOUR SURGERY  DO NOT TAKE THE FOLLOWING 7 DAYS PRIOR TO SURGERY: Ozempic, Wegovy, Rybelsus (Semaglutide), Byetta (exenatide), Bydureon (exenatide ER), Victoza, Saxenda (liraglutide), or Trulicity (dulaglutide) Mounjaro (Tirzepatide) Adlyxin (Lixisenatide), Polyethylene Glycol Loxenatide. HOLD Ozempic after: 10/03/23                     You may not have any metal on your body including hair pins, jewelry, and body piercing             Do not wear lotions, powders, perfumes/cologne, or deodorant              Men may shave face and neck.   Do not bring valuables to the hospital. [facility] IS NOT             RESPONSIBLE   FOR VALUABLES.   Contacts, glasses, or bridgework may not be worn into surgery.   Bring small overnight bag day of surgery.   DO NOT BRING YOUR HOME MEDICATIONS TO THE HOSPITAL. PHARMACY WILL DISPENSE MEDICATIONS LISTED ON YOUR MEDICATION LIST TO YOU DURING YOUR ADMISSION IN THE HOSPITAL!    Patients discharged on the day of surgery will not be allowed to drive home.  Someone NEEDS to stay with you  for the first 24 hours after anesthesia.   Special Instructions: Bring a copy of your healthcare power of attorney and living will documents         the day of surgery if you haven't scanned them before.              Please read over the following fact sheets you were given: IF YOU HAVE QUESTIONS ABOUT YOUR PRE-OP INSTRUCTIONS PLEASE CALL [phone]      Pre-operative  5 CHG Bath Instructions   You can play a key role in reducing the risk of infection after surgery. Your skin needs to be as free of germs as possible. You can reduce the number of germs on your skin by washing with CHG (chlorhexidine gluconate) soap before surgery. CHG is an antiseptic soap that kills germs and continues to kill germs even after washing.   DO NOT use if you have an allergy to chlorhexidine/CHG or antibacterial soaps. If your skin becomes reddened or irritated, stop using the CHG and notify one of our RNs at [phone].   Please shower with the CHG soap starting 4 days before surgery using the following schedule:     Please keep in mind the following:  DO NOT shave, including legs and underarms, starting the day of your first shower.   You may shave your face at any point before/day of surgery.  Place clean sheets on your bed the day you start using CHG soap. Use a clean washcloth (not used since being washed) for each shower. DO NOT sleep with pets once you start using the CHG.   CHG Shower Instructions:  If you choose to wash your hair and private area, wash first with your normal shampoo/soap.  After you use shampoo/soap, rinse your hair and body thoroughly to remove shampoo/soap residue.  Turn the water OFF and apply about 3 tablespoons (45 ml) of CHG soap to a CLEAN washcloth.  Apply CHG soap ONLY FROM YOUR NECK DOWN TO YOUR TOES (washing for 3-5 minutes)  DO NOT use CHG soap on face, private areas, open wounds, or sores.  Pay special attention to the area where your surgery is being performed.  If you are having back surgery, having someone wash your back for you may be helpful. Wait 2 minutes after CHG soap is applied, then you may rinse off the CHG soap.  Pat dry with a clean towel  Put on clean clothes/pajamas   If you choose to wear lotion, please use ONLY the CHG-compatible lotions on the back of this paper.     Additional instructions for the day of  surgery: DO NOT APPLY any lotions, deodorants, cologne, or perfumes.   Put on clean/comfortable clothes.  Brush your teeth.  Ask your nurse before applying any prescription medications to the skin.    CHG Compatible Lotions   Aveeno Moisturizing lotion  Cetaphil Moisturizing Cream  Cetaphil Moisturizing Lotion  Clairol Herbal Essence Moisturizing Lotion, Dry Skin  Clairol Herbal Essence Moisturizing Lotion, Extra Dry Skin  Clairol Herbal Essence Moisturizing Lotion, Normal Skin  Curel Age Defying Therapeutic Moisturizing Lotion with Alpha Hydroxy  Curel Extreme Care Body Lotion  Curel Soothing Hands Moisturizing Hand Lotion  Curel Therapeutic Moisturizing Cream, Fragrance-Free  Curel Therapeutic Moisturizing Lotion, Fragrance-Free  Curel Therapeutic Moisturizing Lotion, Original Formula  Eucerin Daily Replenishing Lotion  Eucerin Dry Skin Therapy Plus Alpha Hydroxy Crme  Eucerin Dry Skin Therapy Plus Alpha Hydroxy Lotion  Eucerin Original Crme  Eucerin Original Lotion  Eucerin Plus Crme Eucerin Plus Lotion  Eucerin TriLipid Replenishing Lotion  Keri Anti-Bacterial Hand Lotion  Keri Deep Conditioning Original Lotion Dry Skin Formula Softly Scented  Keri Deep Conditioning Original Lotion, Fragrance Free Sensitive Skin Formula  Keri Lotion Fast Absorbing Fragrance Free Sensitive Skin Formula  Keri Lotion Fast Absorbing Softly Scented Dry Skin Formula  Keri Original Lotion  Keri Skin Renewal Lotion Keri Silky Smooth Lotion  Keri Silky Smooth Sensitive Skin Lotion  Nivea Body Creamy Conditioning Oil  Nivea Body Extra Enriched Lotion  Nivea Body Original Lotion  Nivea Body Sheer Moisturizing Lotion Nivea Crme  Nivea Skin Firming Lotion  NutraDerm 30 Skin Lotion  NutraDerm Skin Lotion  NutraDerm Therapeutic Skin Cream  NutraDerm Therapeutic Skin Lotion  ProShield Protective Hand Cream  Provon moisturizing lotion   Incentive Spirometer  An incentive spirometer is a tool  that can help keep your lungs clear and active. This tool measures how well you are filling your lungs with each breath. Taking long deep breaths may help reverse or decrease the chance of developing breathing (pulmonary) problems (especially infection) following: A long period of time when you are unable to move or be active. BEFORE THE PROCEDURE  If the spirometer includes an indicator to show your best effort, your nurse or respiratory therapist will set it to a desired goal. If possible, sit up straight or lean slightly forward. Try not to slouch. Hold the incentive spirometer in an upright position. INSTRUCTIONS FOR USE  Sit on the edge of your bed if possible, or sit up as far as you can in bed or on a chair. Hold the incentive spirometer in an upright position. Breathe out normally. Place the mouthpiece in your mouth and seal your lips tightly around it. Breathe in slowly and as deeply as possible, raising the piston or the ball toward the top of the column. Hold your breath for 3-5 seconds or for as long as possible. Allow the piston or ball to fall to the bottom of the column. Remove the mouthpiece from your mouth and breathe out normally. Rest for a few seconds and repeat Steps 1 through 7 at least 10 times every 1-2 hours when you are awake. Take your time and take a few normal breaths between deep breaths. The spirometer may include an indicator to show your best effort. Use the indicator as a goal to work toward during each repetition. After each set of 10 deep breaths, practice coughing to be sure your lungs are clear. If you have an incision (the cut made at the time of surgery), support your incision when coughing by placing a pillow or rolled up towels firmly against it. Once you are able to get out of bed, walk around indoors and cough well. You may stop using the incentive spirometer when instructed by your caregiver.  RISKS AND COMPLICATIONS Take your time so you do not get  dizzy or light-headed. If you are in pain, you may need to take or ask for pain medication before doing incentive spirometry. It is harder to take a deep breath if you are having pain. AFTER USE Rest and breathe slowly and easily. It can be helpful to keep track of a log of your progress. Your caregiver can provide you with a simple table to help with this. If you are using the spirometer at home, follow these instructions: SEEK MEDICAL CARE IF:  You are having difficultly using the spirometer. You have trouble using the spirometer as often as  instructed. Your pain medication is not giving enough relief while using the spirometer. You develop fever of 100.5 F (38.1 C) or higher. SEEK IMMEDIATE MEDICAL CARE IF:  You cough up bloody sputum that had not been present before. You develop fever of 102 F (38.9 C) or greater. You develop worsening pain at or near the incision site. MAKE SURE YOU:  Understand these instructions. Will watch your condition. Will get help right away if you are not doing well or get worse. Document Released: 10/11/2006 Document Revised: 08/23/2011 Document Reviewed: 12/12/2006 [facility] Patient Information 2014 [place], [place].   ________________________________________________________________________

## 2023-09-28 ENCOUNTER — Encounter (HOSPITAL_COMMUNITY)
Admission: RE | Admit: 2023-09-28 | Discharge: 2023-09-28 | Disposition: A | Discharge status: Home / Self Care | Source: Ambulatory Visit | Attending: Orthopedic Surgery | Admitting: Orthopedic Surgery

## 2023-09-28 ENCOUNTER — Telehealth: Payer: Self-pay | Admitting: Student

## 2023-09-28 ENCOUNTER — Encounter (HOSPITAL_COMMUNITY): Payer: Self-pay

## 2023-09-28 ENCOUNTER — Other Ambulatory Visit: Payer: Self-pay

## 2023-09-28 VITALS — BP 138/96 | HR 101 | Temp 97.5°F | Ht 70.0 in | Wt 220.0 lb

## 2023-09-28 DIAGNOSIS — Z01812 Encounter for preprocedural laboratory examination: Secondary | ICD-10-CM | POA: Diagnosis present

## 2023-09-28 DIAGNOSIS — M1611 Unilateral primary osteoarthritis, right hip: Secondary | ICD-10-CM | POA: Insufficient documentation

## 2023-09-28 DIAGNOSIS — I480 Paroxysmal atrial fibrillation: Secondary | ICD-10-CM | POA: Insufficient documentation

## 2023-09-28 DIAGNOSIS — Z01818 Encounter for other preprocedural examination: Secondary | ICD-10-CM | POA: Diagnosis not present

## 2023-09-28 DIAGNOSIS — Z01810 Encounter for preprocedural cardiovascular examination: Secondary | ICD-10-CM | POA: Diagnosis present

## 2023-09-28 DIAGNOSIS — E1140 Type 2 diabetes mellitus with diabetic neuropathy, unspecified: Secondary | ICD-10-CM | POA: Diagnosis not present

## 2023-09-28 HISTORY — DX: Personal history of urinary calculi: Z87.442

## 2023-09-28 HISTORY — DX: Chronic kidney disease, unspecified: N18.9

## 2023-09-28 HISTORY — DX: Unspecified osteoarthritis, unspecified site: M19.90

## 2023-09-28 HISTORY — DX: Cardiac arrhythmia, unspecified: I49.9

## 2023-09-28 LAB — CBC
HCT: 49.3 % (ref 39.0–52.0)
Hemoglobin: 17.1 g/dL — ABNORMAL HIGH (ref 13.0–17.0)
MCH: 30.4 pg (ref 26.0–34.0)
MCHC: 34.7 g/dL (ref 30.0–36.0)
MCV: 87.6 fL (ref 80.0–100.0)
Platelets: 266 10*3/uL (ref 150–400)
RBC: 5.63 MIL/uL (ref 4.22–5.81)
RDW: 13.6 % (ref 11.5–15.5)
WBC: 8.8 10*3/uL (ref 4.0–10.5)
nRBC: 0 % (ref 0.0–0.2)

## 2023-09-28 LAB — BASIC METABOLIC PANEL WITH GFR
Anion gap: 11 (ref 5–15)
BUN: 32 mg/dL — ABNORMAL HIGH (ref 8–23)
CO2: 20 mmol/L — ABNORMAL LOW (ref 22–32)
Calcium: 9.5 mg/dL (ref 8.9–10.3)
Chloride: 102 mmol/L (ref 98–111)
Creatinine, Ser: 1.31 mg/dL — ABNORMAL HIGH (ref 0.61–1.24)
GFR, Estimated: 59 mL/min — ABNORMAL LOW (ref >60)
Glucose, Bld: 153 mg/dL — ABNORMAL HIGH (ref 70–99)
Potassium: 4.5 mmol/L (ref 3.5–5.1)
Sodium: 133 mmol/L — ABNORMAL LOW (ref 135–145)

## 2023-09-28 LAB — GLUCOSE, CAPILLARY: Glucose-Capillary: 184 mg/dL — ABNORMAL HIGH (ref 70–99)

## 2023-09-28 LAB — TYPE AND SCREEN
ABO/RH(D): O POS
Antibody Screen: NEGATIVE

## 2023-09-28 LAB — SURGICAL PCR SCREEN
MRSA, PCR: NEGATIVE
Staphylococcus aureus: POSITIVE — AB

## 2023-09-28 NOTE — Progress Notes (Addendum)
 For Anesthesia: PCP - [facility]. Cardiologist - N/A. Last cardiologist: [place] Speaker: 2019.: Nicholas Lam. [facility] [place] :Sula End., MD: records requested. Bowel Prep reminder:  Chest x-ray -  EKG -  Stress Test -  ECHO -  Cardiac Cath - 11/24/21 Pacemaker/ICD device last checked: Pacemaker orders received: Device Rep notified:  Spinal Cord Stimulator:  Sleep Study -  CPAP -   Fasting Blood Sugar - 120's - 165 Checks Blood Sugar ___3__ times a day Date and result of last Hgb A1c-7.6: 09/21/23  Last dose of GLP1 agonist- Ozempic: Last dose: 09/22/23 GLP1 instructions: To keep on hold.  Last dose of SGLT-2 inhibitors- N/A SGLT-2 instructions:   Blood Thinner Instructions:Eliquis: will be on hold after: 10/07/23 Aspirin Instructions: Last Dose:  Activity level: Can go up a flight of stairs and activities of daily living without stopping and without chest pain and/or shortness of breath      Unable to go up a flight of stairs due to wheelchair bound.   Anesthesia review: Hx: DVT,Left retinal detachment,Paroxysmal atrial fibrillation,DIA.   Patient denies shortness of breath, fever, cough and chest pain at PAT appointment   Patient verbalized understanding of instructions that were given to them at the PAT appointment. Patient was also instructed that they will need to review over the PAT instructions again at home before surgery.

## 2023-09-28 NOTE — Progress Notes (Signed)
 Internal Medicine Clinic Attending  Case discussed with the resident at the time of the visit.  We reviewed the resident's history and exam and pertinent patient test results.  I agree with the assessment, diagnosis, and plan of care documented in the resident's note.

## 2023-09-28 NOTE — Telephone Encounter (Signed)
 [facility] has faxed over a

## 2023-09-29 ENCOUNTER — Other Ambulatory Visit: Payer: Self-pay | Admitting: Orthopedic Surgery

## 2023-09-29 DIAGNOSIS — M25551 Pain in right hip: Secondary | ICD-10-CM

## 2023-09-29 NOTE — Progress Notes (Signed)
PCR: + STAPH °

## 2023-10-03 ENCOUNTER — Ambulatory Visit
Admission: RE | Admit: 2023-10-03 | Discharge: 2023-10-03 | Discharge status: Home / Self Care | Source: Ambulatory Visit | Attending: Orthopedic Surgery | Admitting: Orthopedic Surgery

## 2023-10-03 DIAGNOSIS — M25551 Pain in right hip: Secondary | ICD-10-CM

## 2023-10-05 NOTE — Progress Notes (Signed)
 Anesthesia Chart Review   Case: [id number] Date/Time: 10/11/23 0825   Procedure: ARTHROPLASTY, HIP, TOTAL, ANTERIOR APPROACH (Right: Hip)   Anesthesia type: Spinal   Pre-op diagnosis: Right hip avascular necrosis   Location: [facility] ROOM 10 / [facility]   Surgeons: Claiborne Crew, MD       DISCUSSION:68 y.o. never smoker with h/o DM II (A1C 7.6), CKD, paroxysmal atrial fibrillation, bilateral DVTs, s/p IVC filter, right hip AVN scheduled for above procedure 10/11/2023 with Dr. Claiborne Crew.   Last does of Eliquis  10/07/23.   Pt previously denied clearance by PCP due to poorly controlled diabetes.  His A1C is now 7.6 after medication adjustments from internal medicine.   TTE 05/24/17: The LV is normal in size. The overall LV systolic fxn; EEF 55-60%. LA size is normal. RA size normal. RV normal in size and systolic function.   VS: BP (!) 138/96   Pulse (!) 101   Temp (!) 36.4 C (Oral)   Ht 5\' 10"  (1.778 m)   Wt 99.8 kg   SpO2 97%   BMI 31.57 kg/m   PROVIDERS: Adria Hopkins, MD is PCP    LABS: Labs reviewed: Acceptable for surgery. (all labs ordered are listed, but only abnormal results are displayed)  Labs Reviewed  SURGICAL PCR SCREEN - Abnormal; Notable for the following components:      Result Value   Staphylococcus aureus POSITIVE (*)    All other components within normal limits  BASIC METABOLIC PANEL WITH GFR - Abnormal; Notable for the following components:   Sodium 133 (*)    CO2 20 (*)    Glucose, Bld 153 (*)    BUN 32 (*)    Creatinine, Ser 1.31 (*)    GFR, Estimated 59 (*)    All other components within normal limits  CBC - Abnormal; Notable for the following components:   Hemoglobin 17.1 (*)    All other components within normal limits  GLUCOSE, CAPILLARY - Abnormal; Notable for the following components:   Glucose-Capillary 184 (*)    All other components within normal limits  TYPE AND SCREEN     IMAGES:   EKG:   CV:  Past Medical History:   Diagnosis Date   Arthritis    Chronic kidney disease    Dysrhythmia    History of DVT (deep vein thrombosis)    History of kidney stones    Left retinal detachment    Paroxysmal atrial fibrillation (HCC)    Type 2 diabetes mellitus (HCC)     Past Surgical History:  Procedure Laterality Date   BACK SURGERY  1997   CATARACT EXTRACTION, BILATERAL     CORONARY ANGIOPLASTY     CORONARY ULTRASOUND/IVUS Bilateral 11/24/2021   Procedure: Intravascular Ultrasound/IVUS;  Surgeon: Margherita Shell, MD;  [facility];  Service: Cardiovascular;  Laterality: Bilateral;  IVC BIL LOWER EXT   EYE SURGERY     FOOT SURGERY Right    LOWER EXTREMITY VENOGRAPHY Bilateral 11/24/2021   Procedure: LOWER EXTREMITY VENOGRAPHY;  Surgeon: Margherita Shell, MD;  [facility]: [facility];  Service: Cardiovascular;  Laterality: Bilateral;   PERIPHERAL VASCULAR BALLOON ANGIOPLASTY Bilateral 11/24/2021   Procedure: PERIPHERAL VASCULAR BALLOON ANGIOPLASTY;  Surgeon: Margherita Shell, MD;  [facility]: [facility];  Service: Cardiovascular;  Laterality: Bilateral;   PERIPHERAL VASCULAR THROMBECTOMY Bilateral 11/24/2021   Procedure: PERIPHERAL VASCULAR THROMBECTOMY;  Surgeon: Margherita Shell, MD;  [facility];  Service: Cardiovascular;  Laterality: Bilateral;  LOWER EXT   REPAIR OF COMPLEX TRACTION RETINAL DETACHMENT Left    skin transplant Right    URETEROSCOPY WITH HOLMIUM LASER LITHOTRIPSY      MEDICATIONS:  acetaminophen  (TYLENOL ) 500 MG tablet   apixaban  (ELIQUIS ) 5 MG TABS tablet   ASHWAGANDHA PO   Bromelains (BROMELAIN PO)   Cholecalciferol (VITAMIN D3 PO)   clotrimazole (LOTRIMIN) 1 % cream   CRANBERRY-D MANNOSE PO   fluconazole  (DIFLUCAN ) 100 MG tablet   ibuprofen (ADVIL) 200 MG tablet   Magnesium Bisglycinate (MAG GLYCINATE PO)   Methenamine-Sodium Salicylate (AZO URINARY TRACT DEFENSE PO)   Multiple Vitamin (MULTIVITAMIN WITH MINERALS) TABS tablet    nystatin -triamcinolone  (MYCOLOG II) cream   OZEMPIC , 1 MG/DOSE, 4 MG/3ML SOPN   Semaglutide , 2 MG/DOSE, (OZEMPIC , 2 MG/DOSE,) 8 MG/3ML SOPN   No current facility-administered medications for this encounter.   Chick Cotton Ward, PA-C [facility] [phone]

## 2023-10-10 NOTE — Anesthesia Preprocedure Evaluation (Signed)
 Anesthesia Evaluation  Patient identified by MRN, date of birth, ID band Patient awake    Reviewed: Allergy & Precautions, NPO status , Patient's Chart, lab work & pertinent test results  Airway Mallampati: II  TM Distance: >3 FB Neck ROM: Full    Dental  (+) Poor Dentition, Dental Advisory Given   Pulmonary neg pulmonary ROS   Pulmonary exam normal breath sounds clear to auscultation       Cardiovascular + CAD and + DVT  Normal cardiovascular exam+ dysrhythmias (eliquis  72h) Atrial Fibrillation  Rhythm:Regular Rate:Normal     Neuro/Psych negative neurological ROS  negative psych ROS   GI/Hepatic negative GI ROS, Neg liver ROS,,,  Endo/Other  diabetes  obesity BMI 32  Renal/GU Renal InsufficiencyRenal diseaseCr 1.31  negative genitourinary   Musculoskeletal  (+) Arthritis , Osteoarthritis,  R hip AVN   Abdominal   Peds  Hematology negative hematology ROS (+) Hb 17.1, plt 266   Anesthesia Other Findings Ozempic  LD: 3-4weeks  Reproductive/Obstetrics negative OB ROS                             Anesthesia Physical Anesthesia Plan  ASA: 3  Anesthesia Plan: MAC and Spinal   Post-op Pain Management: Tylenol  PO (pre-op)*   Induction:   PONV Risk Score and Plan: 2 and Treatment may vary due to age or medical condition, Propofol infusion and TIVA  Airway Management Planned: Natural Airway and Simple Face Mask  Additional Equipment: None  Intra-op Plan:   Post-operative Plan:   Informed Consent: I have reviewed the patients History and Physical, chart, labs and discussed the procedure including the risks, benefits and alternatives for the proposed anesthesia with the patient or authorized representative who has indicated his/her understanding and acceptance.       Plan Discussed with: CRNA  Anesthesia Plan Comments:         Anesthesia Quick Evaluation

## 2023-10-11 ENCOUNTER — Encounter (HOSPITAL_COMMUNITY)
Admission: RE | Disposition: A | Discharge status: Home Health | Payer: Self-pay | Source: Ambulatory Visit | Attending: Orthopedic Surgery

## 2023-10-11 ENCOUNTER — Encounter (HOSPITAL_COMMUNITY): Payer: Self-pay | Admitting: Orthopedic Surgery

## 2023-10-11 ENCOUNTER — Ambulatory Visit (HOSPITAL_COMMUNITY): Payer: Self-pay | Admitting: Physician Assistant

## 2023-10-11 ENCOUNTER — Observation Stay (HOSPITAL_COMMUNITY)

## 2023-10-11 ENCOUNTER — Ambulatory Visit (HOSPITAL_BASED_OUTPATIENT_CLINIC_OR_DEPARTMENT_OTHER): Payer: Self-pay | Admitting: Certified Registered"

## 2023-10-11 ENCOUNTER — Other Ambulatory Visit: Payer: Self-pay

## 2023-10-11 ENCOUNTER — Ambulatory Visit (HOSPITAL_COMMUNITY)

## 2023-10-11 ENCOUNTER — Observation Stay (HOSPITAL_COMMUNITY)
Admission: RE | Admit: 2023-10-11 | Discharge: 2023-10-12 | Disposition: A | Discharge status: Home Health | Payer: Medicare Other | Source: Ambulatory Visit | Attending: Orthopedic Surgery | Admitting: Orthopedic Surgery

## 2023-10-11 DIAGNOSIS — I4891 Unspecified atrial fibrillation: Secondary | ICD-10-CM

## 2023-10-11 DIAGNOSIS — I2510 Atherosclerotic heart disease of native coronary artery without angina pectoris: Secondary | ICD-10-CM | POA: Diagnosis not present

## 2023-10-11 DIAGNOSIS — E1122 Type 2 diabetes mellitus with diabetic chronic kidney disease: Secondary | ICD-10-CM | POA: Diagnosis not present

## 2023-10-11 DIAGNOSIS — M1611 Unilateral primary osteoarthritis, right hip: Secondary | ICD-10-CM | POA: Diagnosis present

## 2023-10-11 DIAGNOSIS — Z86718 Personal history of other venous thrombosis and embolism: Secondary | ICD-10-CM | POA: Diagnosis not present

## 2023-10-11 DIAGNOSIS — M87051 Idiopathic aseptic necrosis of right femur: Secondary | ICD-10-CM | POA: Diagnosis not present

## 2023-10-11 DIAGNOSIS — I480 Paroxysmal atrial fibrillation: Secondary | ICD-10-CM | POA: Diagnosis not present

## 2023-10-11 DIAGNOSIS — N189 Chronic kidney disease, unspecified: Secondary | ICD-10-CM | POA: Insufficient documentation

## 2023-10-11 DIAGNOSIS — Z96641 Presence of right artificial hip joint: Secondary | ICD-10-CM

## 2023-10-11 DIAGNOSIS — E119 Type 2 diabetes mellitus without complications: Secondary | ICD-10-CM

## 2023-10-11 HISTORY — PX: TOTAL HIP ARTHROPLASTY: SHX124

## 2023-10-11 LAB — GLUCOSE, CAPILLARY
Glucose-Capillary: 152 mg/dL — ABNORMAL HIGH (ref 70–99)
Glucose-Capillary: 127 mg/dL — ABNORMAL HIGH (ref 70–99)
Glucose-Capillary: 196 mg/dL — ABNORMAL HIGH (ref 70–99)
Glucose-Capillary: 317 mg/dL — ABNORMAL HIGH (ref 70–99)

## 2023-10-11 LAB — ABO/RH: ABO/RH(D): O POS

## 2023-10-11 SURGERY — ARTHROPLASTY, HIP, TOTAL, ANTERIOR APPROACH
Anesthesia: Monitor Anesthesia Care | Site: Hip | Laterality: Right

## 2023-10-11 MED ORDER — CHLORHEXIDINE GLUCONATE 4 % EX SOLN
1.0000 | CUTANEOUS | 1 refills | Status: AC
Start: 2023-10-11 — End: ?

## 2023-10-11 MED ORDER — SODIUM CHLORIDE 0.9% FLUSH
3.0000 mL | Freq: Two times a day (BID) | INTRAVENOUS | Status: DC
  Administered 2023-10-11 – 2023-10-12 (×3): 10 mL via INTRAVENOUS

## 2023-10-11 MED ORDER — METHOCARBAMOL 500 MG PO TABS: 500.0000 mg | ORAL_TABLET | Freq: Four times a day (QID) | ORAL | Status: DC | PRN

## 2023-10-11 MED ORDER — OXYCODONE HCL 5 MG PO TABS: 5.0000 mg | ORAL_TABLET | Freq: Once | ORAL | Status: DC | PRN

## 2023-10-11 MED ORDER — SENNA 8.6 MG PO TABS
2.0000 | ORAL_TABLET | Freq: Every day | ORAL | Status: DC
  Filled 2023-10-11: qty 2

## 2023-10-11 MED ORDER — INSULIN ASPART 100 UNIT/ML IJ SOLN
INTRAMUSCULAR | Status: AC
  Filled 2023-10-11: qty 1

## 2023-10-11 MED ORDER — AMISULPRIDE (ANTIEMETIC) 5 MG/2ML IV SOLN: 10.0000 mg | Freq: Once | INTRAVENOUS | Status: DC | PRN

## 2023-10-11 MED ORDER — CHLORHEXIDINE GLUCONATE 0.12 % MT SOLN
15.0000 mL | Freq: Once | OROMUCOSAL | Status: AC
  Administered 2023-10-11: 15 mL via OROMUCOSAL

## 2023-10-11 MED ORDER — ACETAMINOPHEN 500 MG PO TABS
1000.0000 mg | ORAL_TABLET | Freq: Once | ORAL | Status: AC
  Administered 2023-10-11: 1000 mg via ORAL
  Filled 2023-10-11: qty 2

## 2023-10-11 MED ORDER — PROPOFOL 10 MG/ML IV BOLUS
INTRAVENOUS | Status: AC
  Filled 2023-10-11: qty 20

## 2023-10-11 MED ORDER — PHENYLEPHRINE HCL-NACL 20-0.9 MG/250ML-% IV SOLN
INTRAVENOUS | Status: DC | PRN
Start: 2023-10-11 — End: 2023-10-11
  Administered 2023-10-11: 20 ug/min via INTRAVENOUS

## 2023-10-11 MED ORDER — BISACODYL 10 MG RE SUPP: 10.0000 mg | Freq: Every day | RECTAL | Status: DC | PRN

## 2023-10-11 MED ORDER — PHENOL 1.4 % MT LIQD: 1.0000 | OROMUCOSAL | Status: DC | PRN

## 2023-10-11 MED ORDER — PROPOFOL 500 MG/50ML IV EMUL
INTRAVENOUS | Status: DC | PRN
  Administered 2023-10-11: 40 ug/kg/min via INTRAVENOUS

## 2023-10-11 MED ORDER — METOCLOPRAMIDE HCL 5 MG/ML IJ SOLN: 5.0000 mg | Freq: Three times a day (TID) | INTRAMUSCULAR | Status: DC | PRN

## 2023-10-11 MED ORDER — HYDROMORPHONE HCL 1 MG/ML IJ SOLN: 0.5000 mg | INTRAMUSCULAR | Status: DC | PRN

## 2023-10-11 MED ORDER — ONDANSETRON HCL 4 MG/2ML IJ SOLN
INTRAMUSCULAR | Status: AC
  Filled 2023-10-11: qty 2

## 2023-10-11 MED ORDER — CEFAZOLIN SODIUM-DEXTROSE 2-4 GM/100ML-% IV SOLN
2.0000 g | INTRAVENOUS | Status: AC
  Administered 2023-10-11: 2 g via INTRAVENOUS
  Filled 2023-10-11: qty 100

## 2023-10-11 MED ORDER — MIDAZOLAM HCL 2 MG/2ML IJ SOLN
INTRAMUSCULAR | Status: AC
  Filled 2023-10-11: qty 2

## 2023-10-11 MED ORDER — INSULIN ASPART 100 UNIT/ML IJ SOLN
0.0000 [IU] | Freq: Three times a day (TID) | INTRAMUSCULAR | Status: DC
  Administered 2023-10-11: 3 [IU] via SUBCUTANEOUS

## 2023-10-11 MED ORDER — ACETAMINOPHEN 500 MG PO TABS
1000.0000 mg | ORAL_TABLET | Freq: Four times a day (QID) | ORAL | Status: DC
  Administered 2023-10-11 – 2023-10-12 (×3): 1000 mg via ORAL
  Filled 2023-10-11 (×4): qty 2

## 2023-10-11 MED ORDER — ORAL CARE MOUTH RINSE: 15.0000 mL | Freq: Once | OROMUCOSAL | Status: AC

## 2023-10-11 MED ORDER — LACTATED RINGERS IV SOLN: INTRAVENOUS | Status: DC

## 2023-10-11 MED ORDER — METOCLOPRAMIDE HCL 5 MG PO TABS: 5.0000 mg | ORAL_TABLET | Freq: Three times a day (TID) | ORAL | Status: DC | PRN

## 2023-10-11 MED ORDER — INSULIN ASPART 100 UNIT/ML IJ SOLN
0.0000 [IU] | INTRAMUSCULAR | Status: DC | PRN
Start: 2023-10-11 — End: 2023-10-11
  Administered 2023-10-11: 2 [IU] via SUBCUTANEOUS

## 2023-10-11 MED ORDER — DIPHENHYDRAMINE HCL 12.5 MG/5ML PO ELIX: 12.5000 mg | ORAL_SOLUTION | ORAL | Status: DC | PRN

## 2023-10-11 MED ORDER — INSULIN ASPART 100 UNIT/ML IJ SOLN
0.0000 [IU] | Freq: Every day | INTRAMUSCULAR | Status: DC
  Administered 2023-10-11: 4 [IU] via SUBCUTANEOUS

## 2023-10-11 MED ORDER — PROPOFOL 1000 MG/100ML IV EMUL
INTRAVENOUS | Status: AC
  Filled 2023-10-11: qty 100

## 2023-10-11 MED ORDER — DEXAMETHASONE SODIUM PHOSPHATE 10 MG/ML IJ SOLN
INTRAMUSCULAR | Status: AC
  Filled 2023-10-11: qty 1

## 2023-10-11 MED ORDER — OXYCODONE HCL 5 MG/5ML PO SOLN: 5.0000 mg | Freq: Once | ORAL | Status: DC | PRN

## 2023-10-11 MED ORDER — SODIUM CHLORIDE (PF) 0.9 % IJ SOLN
INTRAMUSCULAR | Status: AC
  Filled 2023-10-11: qty 30

## 2023-10-11 MED ORDER — METHOCARBAMOL 1000 MG/10ML IJ SOLN: 500.0000 mg | Freq: Four times a day (QID) | INTRAMUSCULAR | Status: DC | PRN

## 2023-10-11 MED ORDER — ALUM & MAG HYDROXIDE-SIMETH 200-200-20 MG/5ML PO SUSP: 30.0000 mL | ORAL | Status: DC | PRN

## 2023-10-11 MED ORDER — DEXAMETHASONE SODIUM PHOSPHATE 10 MG/ML IJ SOLN
10.0000 mg | Freq: Once | INTRAMUSCULAR | Status: AC
Start: 2023-10-12 — End: 2023-10-12
  Administered 2023-10-12: 10 mg via INTRAVENOUS
  Filled 2023-10-11: qty 1

## 2023-10-11 MED ORDER — MUPIROCIN 2 % EX OINT: 1.0000 | TOPICAL_OINTMENT | Freq: Two times a day (BID) | CUTANEOUS | 0 refills | Status: AC

## 2023-10-11 MED ORDER — FENTANYL CITRATE (PF) 100 MCG/2ML IJ SOLN
INTRAMUSCULAR | Status: AC
  Filled 2023-10-11: qty 2

## 2023-10-11 MED ORDER — HYDROMORPHONE HCL 1 MG/ML IJ SOLN: 0.2500 mg | INTRAMUSCULAR | Status: DC | PRN

## 2023-10-11 MED ORDER — INSULIN ASPART 100 UNIT/ML IJ SOLN
0.0000 [IU] | Freq: Three times a day (TID) | INTRAMUSCULAR | Status: DC
  Administered 2023-10-12: 2 [IU] via SUBCUTANEOUS

## 2023-10-11 MED ORDER — STERILE WATER FOR IRRIGATION IR SOLN
Status: DC | PRN
  Administered 2023-10-11: 1000 mL

## 2023-10-11 MED ORDER — FENTANYL CITRATE (PF) 100 MCG/2ML IJ SOLN
INTRAMUSCULAR | Status: DC | PRN
  Administered 2023-10-11: 50 ug via INTRAVENOUS

## 2023-10-11 MED ORDER — BUPIVACAINE IN DEXTROSE 0.75-8.25 % IT SOLN
INTRATHECAL | Status: DC | PRN
  Administered 2023-10-11: 1.8 mL via INTRATHECAL

## 2023-10-11 MED ORDER — BUPIVACAINE-EPINEPHRINE (PF) 0.25% -1:200000 IJ SOLN
INTRAMUSCULAR | Status: AC
  Filled 2023-10-11: qty 30

## 2023-10-11 MED ORDER — POVIDONE-IODINE 10 % EX SWAB
2.0000 | Freq: Once | CUTANEOUS | Status: AC
  Administered 2023-10-11: 2 via TOPICAL

## 2023-10-11 MED ORDER — DEXAMETHASONE SODIUM PHOSPHATE 10 MG/ML IJ SOLN
8.0000 mg | Freq: Once | INTRAMUSCULAR | Status: AC
  Administered 2023-10-11: 4 mg via INTRAVENOUS

## 2023-10-11 MED ORDER — POLYETHYLENE GLYCOL 3350 17 G PO PACK
17.0000 g | PACK | Freq: Two times a day (BID) | ORAL | Status: DC
  Administered 2023-10-11: 17 g via ORAL
  Filled 2023-10-11 (×2): qty 1

## 2023-10-11 MED ORDER — PROPOFOL 10 MG/ML IV BOLUS
INTRAVENOUS | Status: DC | PRN
  Administered 2023-10-11: 20 mg via INTRAVENOUS
  Administered 2023-10-11: 10 mg via INTRAVENOUS

## 2023-10-11 MED ORDER — ONDANSETRON HCL 4 MG/2ML IJ SOLN
INTRAMUSCULAR | Status: DC | PRN
  Administered 2023-10-11: 4 mg via INTRAVENOUS

## 2023-10-11 MED ORDER — ONDANSETRON HCL 4 MG/2ML IJ SOLN: 4.0000 mg | Freq: Four times a day (QID) | INTRAMUSCULAR | Status: DC | PRN

## 2023-10-11 MED ORDER — SODIUM CHLORIDE (PF) 0.9 % IJ SOLN
INTRAMUSCULAR | Status: DC | PRN
  Administered 2023-10-11: 61 mL

## 2023-10-11 MED ORDER — TRANEXAMIC ACID-NACL 1000-0.7 MG/100ML-% IV SOLN
1000.0000 mg | Freq: Once | INTRAVENOUS | Status: AC
  Administered 2023-10-11: 1000 mg via INTRAVENOUS
  Filled 2023-10-11: qty 100

## 2023-10-11 MED ORDER — LIDOCAINE HCL (PF) 2 % IJ SOLN
INTRAMUSCULAR | Status: AC
  Filled 2023-10-11: qty 5

## 2023-10-11 MED ORDER — 0.9 % SODIUM CHLORIDE (POUR BTL) OPTIME
TOPICAL | Status: DC | PRN
  Administered 2023-10-11: 1000 mL

## 2023-10-11 MED ORDER — TRAMADOL HCL 50 MG PO TABS: 50.0000 mg | ORAL_TABLET | Freq: Four times a day (QID) | ORAL | Status: DC | PRN

## 2023-10-11 MED ORDER — EPHEDRINE SULFATE-NACL 50-0.9 MG/10ML-% IV SOSY
PREFILLED_SYRINGE | INTRAVENOUS | Status: DC | PRN
Start: 2023-10-11 — End: 2023-10-11
  Administered 2023-10-11 (×3): 5 mg via INTRAVENOUS
  Administered 2023-10-11: 10 mg via INTRAVENOUS

## 2023-10-11 MED ORDER — KETOROLAC TROMETHAMINE 30 MG/ML IJ SOLN
INTRAMUSCULAR | Status: AC
  Filled 2023-10-11: qty 1

## 2023-10-11 MED ORDER — MIDAZOLAM HCL 2 MG/2ML IJ SOLN
INTRAMUSCULAR | Status: DC | PRN
  Administered 2023-10-11 (×2): 1 mg via INTRAVENOUS

## 2023-10-11 MED ORDER — SODIUM CHLORIDE 0.9% FLUSH: 3.0000 mL | INTRAVENOUS | Status: DC | PRN

## 2023-10-11 MED ORDER — ONDANSETRON HCL 4 MG/2ML IJ SOLN: 4.0000 mg | Freq: Once | INTRAMUSCULAR | Status: DC | PRN

## 2023-10-11 MED ORDER — CEFAZOLIN SODIUM-DEXTROSE 2-4 GM/100ML-% IV SOLN
2.0000 g | Freq: Four times a day (QID) | INTRAVENOUS | Status: AC
  Administered 2023-10-11 (×2): 2 g via INTRAVENOUS
  Filled 2023-10-11 (×2): qty 100

## 2023-10-11 MED ORDER — ALBUMIN HUMAN 5 % IV SOLN
INTRAVENOUS | Status: AC
  Filled 2023-10-11: qty 250

## 2023-10-11 MED ORDER — FLUCONAZOLE 100 MG PO TABS
100.0000 mg | ORAL_TABLET | Freq: Every day | ORAL | Status: DC
  Administered 2023-10-11 – 2023-10-12 (×2): 100 mg via ORAL
  Filled 2023-10-11 (×2): qty 1

## 2023-10-11 MED ORDER — ONDANSETRON HCL 4 MG PO TABS: 4.0000 mg | ORAL_TABLET | Freq: Four times a day (QID) | ORAL | Status: DC | PRN

## 2023-10-11 MED ORDER — MENTHOL 3 MG MT LOZG: 1.0000 | LOZENGE | OROMUCOSAL | Status: DC | PRN

## 2023-10-11 MED ORDER — ALBUMIN HUMAN 5 % IV SOLN
12.5000 g | Freq: Once | INTRAVENOUS | Status: AC
  Administered 2023-10-11: 12.5 g via INTRAVENOUS

## 2023-10-11 MED ORDER — TRANEXAMIC ACID-NACL 1000-0.7 MG/100ML-% IV SOLN
1000.0000 mg | INTRAVENOUS | Status: AC
  Administered 2023-10-11: 1000 mg via INTRAVENOUS
  Filled 2023-10-11: qty 100

## 2023-10-11 SURGICAL SUPPLY — 38 items
BAG COUNTER SPONGE SURGICOUNT (BAG) IMPLANT
BAG ZIPLOCK 12X15 (MISCELLANEOUS) IMPLANT
BLADE SAG 18X100X1.27 (BLADE) ×1 IMPLANT
COVER PERINEAL POST (MISCELLANEOUS) ×1 IMPLANT
COVER SURGICAL LIGHT HANDLE (MISCELLANEOUS) ×1 IMPLANT
CUP ACET PINNACLE SECTR 58MM (Hips) IMPLANT
DERMABOND ADVANCED .7 DNX12 (GAUZE/BANDAGES/DRESSINGS) ×1 IMPLANT
DRAPE FOOT SWITCH (DRAPES) ×1 IMPLANT
DRAPE STERI IOBAN 125X83 (DRAPES) ×1 IMPLANT
DRAPE U-SHAPE 47X51 STRL (DRAPES) ×2 IMPLANT
DRESSING AQUACEL AG SP 3.5X10 (GAUZE/BANDAGES/DRESSINGS) ×1 IMPLANT
DRSG AQUACEL AG ADV 3.5X10 (GAUZE/BANDAGES/DRESSINGS) IMPLANT
DURAPREP 26ML APPLICATOR (WOUND CARE) ×1 IMPLANT
ELECT REM PT RETURN 15FT ADLT (MISCELLANEOUS) ×1 IMPLANT
GLOVE BIO SURGEON STRL SZ 6 (GLOVE) ×1 IMPLANT
GLOVE BIOGEL PI IND STRL 6.5 (GLOVE) ×1 IMPLANT
GLOVE BIOGEL PI IND STRL 7.5 (GLOVE) ×1 IMPLANT
GLOVE ORTHO TXT STRL SZ7.5 (GLOVE) ×2 IMPLANT
GOWN STRL REUS W/ TWL LRG LVL3 (GOWN DISPOSABLE) ×2 IMPLANT
HEAD CERAMIC DELTA 36 PLUS 1.5 (Hips) IMPLANT
HOLDER FOLEY CATH W/STRAP (MISCELLANEOUS) ×1 IMPLANT
KIT TURNOVER KIT A (KITS) IMPLANT
LINER NEUTRAL 36X58 PLUS4 IMPLANT
MANIFOLD NEPTUNE II (INSTRUMENTS) ×1 IMPLANT
NDL SAFETY ECLIPSE 18X1.5 (NEEDLE) IMPLANT
PACK ANTERIOR HIP CUSTOM (KITS) ×1 IMPLANT
PENCIL SMOKE EVACUATOR (MISCELLANEOUS) ×1 IMPLANT
SCREW 6.5MMX35MM (Screw) IMPLANT
STEM FEM ACTIS HIGH SZ8 (Stem) IMPLANT
SUT MNCRL AB 4-0 PS2 18 (SUTURE) ×1 IMPLANT
SUT VIC AB 1 CT1 36 (SUTURE) ×3 IMPLANT
SUT VIC AB 2-0 CT1 TAPERPNT 27 (SUTURE) ×2 IMPLANT
SUTURE STRATFX 0 PDS 27 VIOLET (SUTURE) ×1 IMPLANT
SYR 3ML LL SCALE MARK (SYRINGE) IMPLANT
TOWEL GREEN STERILE FF (TOWEL DISPOSABLE) ×1 IMPLANT
TRAY FOLEY MTR SLVR 16FR STAT (SET/KITS/TRAYS/PACK) ×1 IMPLANT
TUBE SUCTION HIGH CAP CLEAR NV (SUCTIONS) ×1 IMPLANT
WATER STERILE IRR 1000ML POUR (IV SOLUTION) ×1 IMPLANT

## 2023-10-11 NOTE — Interval H&P Note (Signed)
 History and Physical Interval Note:  10/11/2023 6:59 AM  Nicholas Lam  has presented today for surgery, with the diagnosis of Right hip avascular necrosis.  The various methods of treatment have been discussed with the patient and family. After consideration of risks, benefits and other options for treatment, the patient has consented to  Procedure(s): ARTHROPLASTY, HIP, TOTAL, ANTERIOR APPROACH (Right) as a surgical intervention.  The patient's history has been reviewed, patient examined, no change in status, stable for surgery.  I have reviewed the patient's chart and labs.  Questions were answered to the patient's satisfaction.     Bevin Bucks

## 2023-10-11 NOTE — Discharge Instructions (Signed)

## 2023-10-11 NOTE — Progress Notes (Signed)
 This RN educated patient on Q2hour turns. This RN educated patient on pressure injury's. Patient states "Im comfortable on my back and I dont think bed sores can come in 24 hours." RN educated patient. Care plan continued.

## 2023-10-11 NOTE — Anesthesia Postprocedure Evaluation (Signed)
 Anesthesia Post Note  Patient: Nicholas Lam  Procedure(s) Performed: ARTHROPLASTY, HIP, TOTAL, ANTERIOR APPROACH (Right: Hip)     Patient location during evaluation: PACU Anesthesia Type: MAC and Spinal Level of consciousness: awake and alert and oriented Pain management: pain level controlled Vital Signs Assessment: post-procedure vital signs reviewed and stable Respiratory status: spontaneous breathing, nonlabored ventilation and respiratory function stable Cardiovascular status: blood pressure returned to baseline and stable Postop Assessment: no headache, no backache, spinal receding and patient able to bend at knees Anesthetic complications: no   No notable events documented.  Last Vitals:  Vitals:   10/11/23 1252 10/11/23 1620  BP: 102/61 110/74  Pulse: 70 60  Resp: 17 18  Temp: 36.4 C 36.4 C  SpO2: 95% 98%    Last Pain:  Vitals:   10/11/23 1500  TempSrc:   PainSc: 0-No pain                 Jacquelyne Matte

## 2023-10-11 NOTE — Progress Notes (Signed)
 Physical Therapy Treatment Patient Details Name: Nicholas Lam MRN: [id number] DOB: [birth year] Today's Date: 10/11/2023   History of Present Illness Pt is a 68 year old male s/p R THA 10/11/23. PMH includes PAF, s/p IVC filter (2009), DM2    PT Comments  Pt tolerating activity better this session as he is ready to return to bed from chair. He is CGA for STS and step pivot to EOB. He is able to side step towards [employer] for positioning and requires sup A for sit to supine. Niece will be present until Thursday and then heading back to [place] .     If plan is discharge home, recommend the following: Assist for transportation;A little help with walking and/or transfers;A little help with bathing/dressing/bathroom;Assistance with cooking/housework   Can travel by private vehicle        Equipment Recommendations  None recommended by PT    Recommendations for Other Services       Precautions / Restrictions Precautions Precautions: Fall Recall of Precautions/Restrictions: Intact Restrictions Weight Bearing Restrictions Per Provider Order: Yes RLE Weight Bearing Per Provider Order: Weight bearing as tolerated     Mobility  Bed Mobility Overal bed mobility: Needs Assistance Bed Mobility: Sit to Supine     Supine to sit: Contact guard Sit to supine: Supervision   General bed mobility comments: requires cues for straightening out legs    Transfers Overall transfer level: Needs assistance Equipment used: Rolling walker (2 wheels) Transfers: Sit to/from Stand, Bed to chair/wheelchair/BSC Sit to Stand: Contact guard assist   Step pivot transfers: Contact guard assist       General transfer comment: side steps at EOB to reach closer to [facility] before stand to sit completed    Ambulation/Gait                   Stairs             Wheelchair Mobility     Tilt Bed    Modified Rankin (Stroke Patients Only)       Balance Overall balance assessment:  Needs assistance Sitting-balance support: Feet supported, No upper extremity supported Sitting balance-Leahy Scale: Fair     Standing balance support: During functional activity, Bilateral upper extremity supported, Reliant on assistive device for balance Standing balance-Leahy Scale: Poor                              Communication Communication Communication: Impaired Factors Affecting Communication: Hearing impaired  Cognition Arousal: Alert Behavior During Therapy: WFL for tasks assessed/performed   PT - Cognitive impairments: No apparent impairments                         Following commands: Intact      Cueing Cueing Techniques: Verbal cues, Gestural cues, Tactile cues  Exercises      General Comments General comments (skin integrity, edema, etc.): edema in dorsum of L foot      Pertinent Vitals/Pain Pain Assessment Pain Assessment: No/denies pain Faces Pain Scale: Hurts a little bit Pain Location: R hip Pain Descriptors / Indicators: Discomfort, Grimacing, Operative site guarding Pain Intervention(s): Limited activity within patient's tolerance, Monitored during session, Repositioned    Home Living Family/patient expects to be discharged to:: Private residence Living Arrangements: Alone Available Help at Discharge: Family Type of Home: Apartment Home Access: Level entry       Home Layout: One level Home Equipment:  Rolling Walker (2 wheels);Rollator (4 wheels);Cane - single point;Electric scooter      Prior Function            PT Goals (current goals can now be found in the care plan section) Acute Rehab PT Goals Patient Stated Goal: Improve mobility PT Goal Formulation: With patient Time For Goal Achievement: 10/25/23 Potential to Achieve Goals: Good Progress towards PT goals: Progressing toward goals    Frequency    Min 5X/week      PT Plan      Co-evaluation              AM-PAC PT "6 Clicks" Mobility    Outcome Measure  Help needed turning from your back to your side while in a flat bed without using bedrails?: None Help needed moving from lying on your back to sitting on the side of a flat bed without using bedrails?: None Help needed moving to and from a bed to a chair (including a wheelchair)?: A Little Help needed standing up from a chair using your arms (e.g., wheelchair or bedside chair)?: A Little Help needed to walk in hospital room?: A Lot Help needed climbing 3-5 steps with a railing? : Total 6 Click Score: 17    End of Session Equipment Utilized During Treatment: Gait belt Activity Tolerance: Patient tolerated treatment well Patient left: in bed;with family/visitor present;with call bell/phone within reach Nurse Communication: Mobility status PT Visit Diagnosis: Other abnormalities of gait and mobility (R26.89)     Time: 0454-0981 PT Time Calculation (min) (ACUTE ONLY): 18 min  Charges:    $Therapeutic Activity: 8-22 mins PT General Charges $$ ACUTE PT VISIT: 1 Visit                     Nicholas Lam PT Acute Rehabilitation Services Office: [phone] 10/11/2023    Nicholas Lam 10/11/2023, 4:51 PM

## 2023-10-11 NOTE — H&P (Signed)
 TOTAL HIP ADMISSION H&P  Patient is admitted for right total hip arthroplasty.  Therapy Plans: HEP Disposition: Home with neice Planned DVT Prophylaxis: aspirin  81mg  BID DME needed: none PCP: Dr. Abner Hoffman - saw in the past few weeks - will send form TXA: IV Allergies: contrast/iodine  - hives (tells me he can tolerate betadine) Anesthesia Concerns: none BMI: 31.7 Last HgbA1c: 7.6%   Other: - SEND FEMORAL HEAD TO PATHOLOGY - niece is a physician - she will be staying with him for a few days > she/they want his eliquis  held for 48 hours post op due to concerns about spinal hematoma  - tramadol (prefers no medication), robaxin, tylenol    Subjective:  Chief Complaint: right hip pain  HPI: Nicholas Lam, 68 y.o. male, has a history of pain and functional disability in the right hip(s) due to arthritis and patient has failed non-surgical conservative treatments for greater than 12 weeks to include NSAID's and/or analgesics, use of assistive devices, and activity modification.  Onset of symptoms was gradual starting 3 years ago with gradually worsening course since that time.The patient noted no past surgery on the right hip(s).  Patient currently rates pain in the right hip at 8 out of 10 with activity. Patient has worsening of pain with activity and weight bearing, pain that interfers with activities of daily living, and pain with passive range of motion. Patient has evidence of joint space narrowing by imaging studies. This condition presents safety issues increasing the risk of falls. .  There is no current active infection.  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   Pain due to onychomycosis of toenails of both feet 02/05/2020    Porokeratosis 02/05/2020   Paroxysmal atrial fibrillation (HCC) 08/11/2017   Amblyopia of left eye 04/16/2016   Hypertropia of right eye 04/16/2016   PVD (posterior vitreous detachment), bilateral 04/16/2016   Obstruction of left ureteropelvic junction (UPJ) due to stone 01/14/2016   Metatarsalgia 11/23/2015   Adenoma of left adrenal gland 09/04/2015   Hepatic cyst 09/04/2015   Diabetes mellitus with diabetic neuropathy (HCC) 09/04/2015   Ventral hernia without obstruction or gangrene 09/04/2015   Left retinal detachment 08/29/2015   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   Obesity, Class I, BMI 30-34.9 08/29/2015   Sensorineural hearing loss (SNHL) of both ears 08/29/2015   Strabismus, mechanical 08/29/2015   Past Medical History:  Diagnosis Date   Arthritis    Chronic kidney disease    Dysrhythmia    History of DVT (deep vein thrombosis)    History of kidney stones    Left retinal detachment    Paroxysmal atrial fibrillation (HCC)    Type 2 diabetes mellitus (HCC)     Past Surgical History:  Procedure Laterality Date   BACK SURGERY  1997   CATARACT EXTRACTION, BILATERAL     CORONARY ANGIOPLASTY     CORONARY ULTRASOUND/IVUS Bilateral 11/24/2021   Procedure: Intravascular Ultrasound/IVUS;  Surgeon: Margherita Shell, MD;  [facility];  Service: Cardiovascular;  Laterality: Bilateral;  IVC BIL LOWER EXT   EYE SURGERY     FOOT SURGERY Right    LOWER EXTREMITY VENOGRAPHY Bilateral 11/24/2021   Procedure: LOWER EXTREMITY VENOGRAPHY;  Surgeon: Margherita Shell, MD;  [facility];  Service: Cardiovascular;  Laterality: Bilateral;   PERIPHERAL VASCULAR BALLOON ANGIOPLASTY Bilateral 11/24/2021   Procedure: PERIPHERAL VASCULAR BALLOON ANGIOPLASTY;  Surgeon: Margherita Shell, MD;  [facility];  Service: Cardiovascular;  Laterality: Bilateral;   PERIPHERAL VASCULAR THROMBECTOMY Bilateral 11/24/2021   Procedure: PERIPHERAL VASCULAR  THROMBECTOMY;  Surgeon: Margherita Shell, MD;  [facility];  Service: Cardiovascular;  Laterality: Bilateral;  LOWER EXT   REPAIR OF COMPLEX TRACTION RETINAL DETACHMENT Left    skin transplant Right    URETEROSCOPY WITH HOLMIUM LASER LITHOTRIPSY      No current facility-administered medications for this encounter.   Allergies  Allergen Reactions   Iodinated Contrast Media Hives, Rash and Swelling   Iodine  Hives, Swelling and Rash    Social History   Tobacco Use   Smoking status: Never   Smokeless tobacco: Never  Substance Use Topics   Alcohol use: Never    No family history on file.   Review of Systems  Constitutional:  Negative for chills and fever.  Respiratory:  Negative for cough and shortness of breath.   Cardiovascular:  Negative for chest pain.  Gastrointestinal:  Negative for nausea and vomiting.  Musculoskeletal:  Positive for arthralgias.     Objective:  Physical Exam  Right hip exam: Very limited and painful hip flexion internal rotation with external rotation contracture noted with internal rotation just to neutral with external rotation to 20 degrees with discomfort and tightness anteriorly No significant lower extremity edema or erythema   Vital signs in last 24 hours:    Labs:   Estimated body mass index is 31.57 kg/m as calculated from the following:   Height as of 09/28/23: 5\' 10"  (1.778 m).   Weight as of 09/28/23: 99.8 kg.   Imaging Review Plain radiographs demonstrate severe degenerative joint disease of the right hip(s). The bone quality appears to be adequate for age and reported activity level.      Assessment/Plan:  End stage arthritis, right hip(s)  The patient history, physical examination, clinical judgement of the provider and imaging studies are consistent with end stage degenerative joint disease of the right hip(s) and total hip arthroplasty is deemed medically necessary. The treatment options including  medical management, injection therapy, arthroscopy and arthroplasty were discussed at length. The risks and benefits of total hip arthroplasty were presented and reviewed. The risks due to aseptic loosening, infection, stiffness, dislocation/subluxation,  thromboembolic complications and other imponderables were discussed.  The patient acknowledged the explanation, agreed to proceed with the plan and consent was signed. Patient is being admitted for inpatient treatment for surgery, pain control, PT, OT, prophylactic antibiotics, VTE prophylaxis, progressive ambulation and ADL's and discharge planning.The patient is planning to be discharged  home.   Kim Pen, PA-C Orthopedic Surgery [facility] [place] [phone]

## 2023-10-11 NOTE — Anesthesia Procedure Notes (Signed)
 Spinal  Patient location during procedure: OR Start time: 10/11/2023 8:35 AM Reason for block: surgical anesthesia Staffing Performed: resident/CRNA  Anesthesiologist: Jacquelyne Matte, DO Resident/CRNA: Alwyn Juba, CRNA Performed by: Alwyn Juba, CRNA Authorized by: Jacquelyne Matte, DO   Preanesthetic Checklist Completed: patient identified, IV checked, site marked, risks and benefits discussed, surgical consent, monitors and equipment checked, pre-op evaluation and timeout performed Spinal Block Patient position: sitting Prep: DuraPrep and site prepped and draped Patient monitoring: continuous pulse ox, blood pressure, cardiac monitor and heart rate Approach: midline Location: L3-4 Injection technique: single-shot Needle Needle type: Pencan  Needle gauge: 24 G Needle length: 10 cm Assessment Events: CSF return Additional Notes Pt placed in sitting position, spinal kit expiration date checked and verified, + CSF, - heme, pt tolerated well. Dr Wendy Hamel present and supervising throughout SAB placement. Adequate sensory level.

## 2023-10-11 NOTE — Anesthesia Procedure Notes (Signed)
 Procedure Name: MAC Date/Time: 10/11/2023 8:30 AM  Performed by: Alwyn Juba, CRNAPre-anesthesia Checklist: Patient identified, Emergency Drugs available, Suction available, Patient being monitored and Timeout performed Oxygen Delivery Method: Simple face mask Placement Confirmation: positive ETCO2

## 2023-10-11 NOTE — Evaluation (Signed)
 Physical Therapy Evaluation Patient Details Name: Nicholas Lam MRN: [id number] DOB: 04/12/1956 Today's Date: 10/11/2023  History of Present Illness  Pt is a 68 year old male s/p R THA 10/11/23. PMH includes PAF, s/p IVC filter (2009), DM2  Clinical Impression  Pt is s/p THA resulting in the deficits listed below (see PT Problem List). Pt agreeable to session, excited to see PT, niece at bedside. Pt able to come to sit EOB at [facility], has hx of positional hypotension, BP assessed. Pt able to come to stand with 2WW at mod A, maintains this level of assist for step pivot to chair. Has good recall of safety techniques. Will trial transfer to power chair in future sessions. Pt will benefit from acute skilled PT to increase their independence and safety with mobility to facilitate discharge.   BP supine: 112/57 BP sitting: 128/65         If plan is discharge home, recommend the following: A lot of help with walking and/or transfers;A lot of help with bathing/dressing/bathroom;Assistance with cooking/housework;Assist for transportation   Can travel by private vehicle        Equipment Recommendations None recommended by PT  Recommendations for Other Services       Functional Status Assessment Patient has had a recent decline in their functional status and demonstrates the ability to make significant improvements in function in a reasonable and predictable amount of time.     Precautions / Restrictions Precautions Precautions: Fall Recall of Precautions/Restrictions: Intact Restrictions Weight Bearing Restrictions Per Provider Order: Yes RLE Weight Bearing Per Provider Order: Weight bearing as tolerated      Mobility  Bed Mobility Overal bed mobility: Needs Assistance Bed Mobility: Supine to Sit     Supine to sit: Contact guard          Transfers Overall transfer level: Needs assistance Equipment used: Rolling walker (2 wheels) Transfers: Sit to/from Stand, Bed to  chair/wheelchair/BSC Sit to Stand: Mod assist   Step pivot transfers: Mod assist            Ambulation/Gait                  Stairs            Wheelchair Mobility     Tilt Bed    Modified Rankin (Stroke Patients Only)       Balance Overall balance assessment: Needs assistance Sitting-balance support: Feet supported, No upper extremity supported Sitting balance-Leahy Scale: Fair     Standing balance support: During functional activity, Bilateral upper extremity supported, Reliant on assistive device for balance Standing balance-Leahy Scale: Poor                               Pertinent Vitals/Pain Pain Assessment Pain Assessment: Faces Faces Pain Scale: Hurts a little bit Pain Location: R hip Pain Descriptors / Indicators: Discomfort, Grimacing, Operative site guarding Pain Intervention(s): Limited activity within patient's tolerance, Monitored during session, Repositioned    Home Living Family/patient expects to be discharged to:: Private residence Living Arrangements: Alone Available Help at Discharge: Family Type of Home: Apartment Home Access: Level entry       Home Layout: One level Home Equipment: Agricultural consultant (2 wheels);Rollator (4 wheels);Cane - single point;Electric scooter      Prior Function Prior Level of Function : Independent/Modified Independent             Mobility Comments: limited mobility, transfers to  and from electric scooter       Extremity/Trunk Assessment             Cervical / Trunk Assessment Cervical / Trunk Assessment: Kyphotic  Communication   Communication Communication: Impaired Factors Affecting Communication: Hearing impaired    Cognition Arousal: Alert Behavior During Therapy: WFL for tasks assessed/performed   PT - Cognitive impairments: No apparent impairments                         Following commands: Intact       Cueing Cueing Techniques: Verbal cues,  Gestural cues, Tactile cues     General Comments General comments (skin integrity, edema, etc.): edema in dorsum of L foot    Exercises     Assessment/Plan    PT Assessment Patient needs continued PT services  PT Problem List Decreased strength;Decreased activity tolerance;Decreased mobility;Decreased range of motion;Decreased balance;Decreased coordination       PT Treatment Interventions DME instruction;Functional mobility training;Balance training;Patient/family education;Gait training;Therapeutic activities;Therapeutic exercise    PT Goals (Current goals can be found in the Care Plan section)  Acute Rehab PT Goals Patient Stated Goal: Improve mobility PT Goal Formulation: With patient Time For Goal Achievement: 10/25/23 Potential to Achieve Goals: Good    Frequency Min 5X/week     Co-evaluation               AM-PAC PT "6 Clicks" Mobility  Outcome Measure Help needed turning from your back to your side while in a flat bed without using bedrails?: A Little Help needed moving from lying on your back to sitting on the side of a flat bed without using bedrails?: A Little Help needed moving to and from a bed to a chair (including a wheelchair)?: A Lot Help needed standing up from a chair using your arms (e.g., wheelchair or bedside chair)?: A Lot Help needed to walk in hospital room?: Total Help needed climbing 3-5 steps with a railing? : Total 6 Click Score: 12    End of Session Equipment Utilized During Treatment: Gait belt Activity Tolerance: Patient tolerated treatment well Patient left: in chair;with family/visitor present;with nursing/sitter in room;with call bell/phone within reach Nurse Communication: Mobility status PT Visit Diagnosis: Other abnormalities of gait and mobility (R26.89)    Time: 1410-1440 PT Time Calculation (min) (ACUTE ONLY): 30 min   Charges:   PT Evaluation $PT Eval Low Complexity: 1 Low PT Treatments $Therapeutic Activity: 8-22  mins PT General Charges $$ ACUTE PT VISIT: 1 Visit         Darien Eden PT Acute Rehabilitation Services Office: [phone] 10/11/2023   Serafin Dames 10/11/2023, 2:51 PM

## 2023-10-11 NOTE — Op Note (Signed)
 NAME:  Nicholas Lam                ACCOUNT NO.: [id number]      MEDICAL RECORD NO.: [id number]      FACILITY:  [facility]      PHYSICIAN:  Bevin Bucks  DATE OF BIRTH:  Mar 08, 1956     DATE OF PROCEDURE:  10/11/2023                                 OPERATIVE REPORT         PREOPERATIVE DIAGNOSIS: Right  hip osteoarthritis.      POSTOPERATIVE DIAGNOSIS:  Right hip osteoarthritis.      PROCEDURE:  Right total hip replacement through an anterior approach   utilizing DePuy THR system, component size 58 mm pinnacle cup, a size 36+4 neutral   Altrex liner, a size 8 Hi Actis stem with a 36+1.5 delta ceramic   ball.      SURGEON:  Azalea Lento. Bernard Brick, M.D.      ASSISTANT:  Kim Pen, PA-C     ANESTHESIA:  Spinal.      SPECIMENS:  None.      COMPLICATIONS:  None.      BLOOD LOSS:  300 cc     DRAINS:  None.      INDICATION OF THE PROCEDURE:  Nicholas Lam is a 68 y.o. male who had   presented to office for evaluation of right hip pain.  Radiographs revealed   progressive degenerative changes with bone-on-bone   articulation of the  hip joint, including subchondral cystic changes and osteophytes.  The patient had painful limited range of   motion significantly affecting their overall quality of life and function.  The patient was failing to    respond to conservative measures including medications and/or injections and activity modification and at this point was ready   to proceed with more definitive measures.  Consent was obtained for   benefit of pain relief.  Specific risks of infection, DVT, component   failure, dislocation, neurovascular injury, and need for revision surgery were reviewed in the office.  Preoperatively he was noted to have some chondroid appearing matrix within his femoral neck and metaphyseal region of the hip.  He was unable to have an MRI performed based on pacemaker placement despite efforts at obtaining this.  We did  do a CT scan preoperatively.  In addition to the severe degenerative changes they commented on the large lesion occupying the proximal right femur and extending into the lower femoral neck.  This had been reviewed in 2023.  Differential radiographically included enchondroma versus bone infarct versus fibrous dysplasia versus lipo sclerosing myxoid fibrotic tumor.     PROCEDURE IN DETAIL:  The patient was brought to operative theater.   Once adequate anesthesia, preoperative antibiotics, 2 gm Ancef, 1 gm of Tranexamic Acid, and 10 mg of Decadron were administered, the patient was positioned supine on the [employer] table.  Once the patient was safely positioned with adequate padding of boney prominences we predraped out the hip, and used fluoroscopy to confirm orientation of the pelvis.      The right hip was then prepped and draped from proximal iliac crest to   mid thigh with a shower curtain technique.      Time-out was performed identifying the patient, planned procedure, and the appropriate extremity.  An incision was then made 2 cm lateral to the   anterior superior iliac spine extending over the orientation of the   tensor fascia lata muscle and sharp dissection was carried down to the   fascia of the muscle.      The fascia was then incised.  The muscle belly was identified and swept   laterally and retractor placed along the superior neck.  Following   cauterization of the circumflex vessels and removing some pericapsular   fat, a second cobra retractor was placed on the inferior neck.  A T-capsulotomy was made along the line of the   superior neck to the trochanteric fossa, then extended proximally and   distally.  Tag sutures were placed and the retractors were then placed   intracapsular.  We then identified the trochanteric fossa and   orientation of my neck cut and then made a neck osteotomy with the femur on traction.  The femoral   head was removed without difficulty or  complication.  Traction was let   off and retractors were placed posterior and anterior around the   acetabulum.   Based on the preoperative radiographic evaluation the femoral head and neck following resection were prepared for pathology.  I did also remove bone from the metaphysis as we began to prepare the the proximal femur.  In general he had significant disuse osteoporotic bone quality.  The metaphyseal bone in this region appeared to be very soft making it difficult to differentiate between any potential tumor related material versus osteoporotic cancellous bone.     The labrum and foveal tissue were debrided.  I began reaming with a 47 mm   reamer and reamed up to 57 mm reamer with good bony bed preparation and a 58 mm  cup was chosen.  The final 58 mm Pinnacle cup was then impacted under fluoroscopy to confirm the depth of penetration and orientation with respect to   Abduction and forward flexion.  A screw was placed into the ilium followed by the hole eliminator.  The final   36+4 neutral Altrex liner was impacted with good visualized rim fit.  The cup was positioned anatomically within the acetabular portion of the pelvis.  He was noted to have significant periacetabular osteophytes circumferentially that were removed with an osteotome.     At this point, the femur was rolled to 100 degrees.  Further capsule was   released off the inferior aspect of the femoral neck.  I then   released the superior capsule proximally.  With the leg in a neutral position the hook was placed laterally   along the femur under the vastus lateralis origin and elevated manually and then held in position using the hook attachment on the bed.  The leg was then extended and adducted with the leg rolled to 100   degrees of external rotation.  Retractors were placed along the medial calcar and posteriorly over the greater trochanter.  Once the proximal femur was fully   exposed, I used a box osteotome to set  orientation.  I then began   broaching with the starting chili pepper broach and passed this by hand and then broached up to 8.  With the 8 broach in place I chose a high offset neck and did several trial reductions.  The offset was appropriate, leg lengths   appeared to be equal best matched with the +1.5 head ball trial confirmed radiographically.   Given these findings, I went ahead and  dislocated the hip, repositioned all   retractors and positioned the right hip in the extended and abducted position.  The final 8 Hi Actis stem was   chosen and it was impacted down to the level of neck cut.  Based on this   and the trial reductions, a final 36+1.5 delta ceramic ball was chosen and   impacted onto a clean and dry trunnion, and the hip was reduced.  The   hip had been irrigated throughout the case again at this point.  I did   reapproximate the superior capsular leaflet to the anterior leaflet   using #1 Vicryl.  The fascia of the   tensor fascia lata muscle was then reapproximated using #1 Vicryl and #0 Stratafix sutures.  The   remaining wound was closed with 2-0 Vicryl and running 4-0 Monocryl.   The hip was cleaned, dried, and dressed sterilely using Dermabond and   Aquacel dressing.  The patient was then brought   to recovery room in stable condition tolerating the procedure well.    Kim Pen, PA-C was present for the entirety of the case involved from   preoperative positioning, perioperative retractor management, general   facilitation of the case, as well as primary wound closure as assistant.            Azalea Lento Bernard Brick, M.D.        10/11/2023 8:35 AM

## 2023-10-11 NOTE — Progress Notes (Addendum)
 Patients blood sugar is 317. Odilia Bennett, PA notified. Care plan continued.

## 2023-10-11 NOTE — Transfer of Care (Signed)
 Immediate Anesthesia Transfer of Care Note  Patient: Nicholas Lam  Procedure(s) Performed: ARTHROPLASTY, HIP, TOTAL, ANTERIOR APPROACH (Right: Hip)  Patient Location: PACU  Anesthesia Type:Spinal  Level of Consciousness: awake, drowsy, and patient cooperative  Airway & Oxygen Therapy: Patient Spontanous Breathing and Patient connected to face mask oxygen  Post-op Assessment: Report given to RN and Post -op Vital signs reviewed and stable  Post vital signs: Reviewed and stable  Last Vitals:  Vitals Value Taken Time  BP 90/58 10/11/23 1013  Temp    Pulse 79 10/11/23 1015  Resp 24 10/11/23 1015  SpO2 100 % 10/11/23 1015  Vitals shown include unfiled device data.  Last Pain:  Vitals:   10/11/23 0731  TempSrc: Oral  PainSc:          Complications: No notable events documented.

## 2023-10-12 ENCOUNTER — Encounter (HOSPITAL_COMMUNITY): Payer: Self-pay | Admitting: Orthopedic Surgery

## 2023-10-12 DIAGNOSIS — M1611 Unilateral primary osteoarthritis, right hip: Secondary | ICD-10-CM | POA: Diagnosis not present

## 2023-10-12 LAB — BASIC METABOLIC PANEL WITH GFR
Anion gap: 11 (ref 5–15)
BUN: 22 mg/dL (ref 8–23)
CO2: 21 mmol/L — ABNORMAL LOW (ref 22–32)
Calcium: 8.7 mg/dL — ABNORMAL LOW (ref 8.9–10.3)
Chloride: 103 mmol/L (ref 98–111)
Creatinine, Ser: 0.99 mg/dL (ref 0.61–1.24)
GFR, Estimated: >60 mL/min (ref >60)
Glucose, Bld: 144 mg/dL — ABNORMAL HIGH (ref 70–99)
Potassium: 4.3 mmol/L (ref 3.5–5.1)
Sodium: 135 mmol/L (ref 135–145)

## 2023-10-12 LAB — CBC
HCT: 37.1 % — ABNORMAL LOW (ref 39.0–52.0)
Hemoglobin: 12.6 g/dL — ABNORMAL LOW (ref 13.0–17.0)
MCH: 30.3 pg (ref 26.0–34.0)
MCHC: 34 g/dL (ref 30.0–36.0)
MCV: 89.2 fL (ref 80.0–100.0)
Platelets: 180 10*3/uL (ref 150–400)
RBC: 4.16 MIL/uL — ABNORMAL LOW (ref 4.22–5.81)
RDW: 14 % (ref 11.5–15.5)
WBC: 9.4 10*3/uL (ref 4.0–10.5)
nRBC: 0 % (ref 0.0–0.2)

## 2023-10-12 LAB — GLUCOSE, CAPILLARY
Glucose-Capillary: 147 mg/dL — ABNORMAL HIGH (ref 70–99)
Glucose-Capillary: 202 mg/dL — ABNORMAL HIGH (ref 70–99)

## 2023-10-12 MED ORDER — POLYETHYLENE GLYCOL 3350 17 G PO PACK: 17.0000 g | PACK | Freq: Two times a day (BID) | ORAL | 0 refills | Status: AC

## 2023-10-12 MED ORDER — TRAMADOL HCL 50 MG PO TABS
50.0000 mg | ORAL_TABLET | Freq: Four times a day (QID) | ORAL | 0 refills | Status: DC | PRN
Start: 2023-10-12 — End: 2023-11-08

## 2023-10-12 MED ORDER — ACETAMINOPHEN 500 MG PO TABS: 1000.0000 mg | ORAL_TABLET | Freq: Four times a day (QID) | ORAL | Status: AC

## 2023-10-12 MED ORDER — METHOCARBAMOL 500 MG PO TABS: 500.0000 mg | ORAL_TABLET | Freq: Four times a day (QID) | ORAL | 2 refills | Status: DC | PRN

## 2023-10-12 NOTE — Progress Notes (Signed)
 Discharge instructions given to patient and niece. Questions asked and answered D Rosevelt Constable RN

## 2023-10-12 NOTE — TOC Transition Note (Signed)
 Transition of Care [facility]) - Discharge Note   Patient Details  Name: Nicholas Lam MRN: [id number] Date of Birth: [birth year]  Transition of Care [facility]) CM/SW Contact:  Bari Leys, RN Phone Number: 10/12/2023, 10:27 AM   Clinical Narrative:   Met with patient and patient's niece, Cathryn, at bedside to review dc therapy and home equipment needs, at baseline patient uses motorized w/c. Niece requesting HH PT for patient as he lives alone and is homebound. NCM reached out to Kim Pen, [place], stats Dr Bernard Brick agreeable to [facility] PT/OT.      Final next level of care: Home w Home Health Services Barriers to Discharge: No Barriers Identified   Patient Goals and CMS Choice Patient states their goals for this hospitalization and ongoing recovery are:: return home          Discharge Placement                       Discharge Plan and Services Additional resources added to the After Visit Summary for                                       Social Drivers of Health (SDOH) Interventions SDOH Screenings   Food Insecurity: No Food Insecurity (10/11/2023)  Housing: Low Risk  (10/11/2023)  Transportation Needs: No Transportation Needs (10/11/2023)  Recent Concern: Transportation Needs - Unmet Transportation Needs (08/10/2023)  Utilities: Not At Risk (10/11/2023)  Alcohol Screen: Low Risk  (08/10/2023)  Depression (PHQ2-9): Low Risk  (09/21/2023)  Financial Resource Strain: Low Risk  (08/10/2023)  Physical Activity: Inactive (08/10/2023)  Social Connections: Moderately Isolated (10/11/2023)  Stress: No Stress Concern Present (08/10/2023)  Tobacco Use: Low Risk  (10/11/2023)  Health Literacy: Adequate Health Literacy (08/10/2023)     Readmission Risk Interventions    11/26/2021   10:07 AM  Readmission Risk Prevention Plan  Post Dischage Appt Complete  Medication Screening Complete  Transportation Screening Complete

## 2023-10-12 NOTE — Progress Notes (Signed)
 Physical Therapy Treatment Patient Details Name: Nicholas Lam MRN: [id number] DOB: Nov 17, 1955 Today's Date: 10/12/2023   History of Present Illness Pt is a 68 year old male s/p R THA 10/11/23. PMH includes PAF, s/p IVC filter (2009), DM2    PT Comments  POD # 1 am session PT - Cognition Comments: AxO x 3 pleasant and motivated who lives home alone and uses a power wc for mobility (can walk short distance just doesn't trust himself)  HIGH FALL RISK Assisted OOB required increased time.  General bed mobility comments: demonstarted and instructed how to use a belt to self assist LE  General transfer comment: Pt self able with increased time and excessive use B UE's.  General Gait Details: limited amb distance 11 feet x 2 poor flexed posture and excessive lean on walker.  Pt mainly uses his Power wheelchair for mobility.  Then returned to room to perform some TE's following HEP handout.  Instructed on proper tech, freq as well as use of ICE.   Will see Pt again this afternoon to complete HEP Education and practice wheelchair transfers.    If plan is discharge home, recommend the following: Assist for transportation;A little help with walking and/or transfers;A little help with bathing/dressing/bathroom;Assistance with cooking/housework   Can travel by private vehicle        Equipment Recommendations  None recommended by PT    Recommendations for Other Services       Precautions / Restrictions Precautions Precautions: Fall Restrictions Weight Bearing Restrictions Per Provider Order: No RLE Weight Bearing Per Provider Order: Weight bearing as tolerated     Mobility  Bed Mobility Overal bed mobility: Needs Assistance Bed Mobility: Supine to Sit     Supine to sit: Supervision, Contact guard     General bed mobility comments: demonstarted and instructed how to use a belt to self assist LE    Transfers Overall transfer level: Needs assistance Equipment used: Rolling  walker (2 wheels) Transfers: Sit to/from Stand, Bed to chair/wheelchair/BSC Sit to Stand: Supervision, Contact guard assist           General transfer comment: Pt self able with increased time and excessive use B UE's.    Ambulation/Gait Ambulation/Gait assistance: Min assist, Mod assist Gait Distance (Feet): 22 Feet (11 feet x 2) Assistive device: Rolling walker (2 wheels) Gait Pattern/deviations: Step-to pattern, Decreased stance time - right Gait velocity: decreased     General Gait Details: limited amb distance 11 feet x 2 poor flexed posture and excessive lean on walker.  Pt mainly uses his Power wheelchair for mobility.   Stairs             Wheelchair Mobility     Tilt Bed    Modified Rankin (Stroke Patients Only)       Balance                                            Communication Communication Communication: Impaired Factors Affecting Communication: Hearing impaired  Cognition Arousal: Alert Behavior During Therapy: WFL for tasks assessed/performed   PT - Cognitive impairments: No apparent impairments                       PT - Cognition Comments: AxO x 3 pleasant and motivated who lives home alone and uses a power wc for mobility (can walk short  distance just doesn't trust himself)  HIGH FALL RISK Following commands: Intact      Cueing Cueing Techniques: Verbal cues  Exercises      General Comments        Pertinent Vitals/Pain Pain Assessment Pain Assessment: 0-10 Pain Score: 5  Pain Location: R hip Pain Descriptors / Indicators: Discomfort, Grimacing, Operative site guarding Pain Intervention(s): Monitored during session, Premedicated before session, Repositioned, Ice applied    Home Living                          Prior Function            PT Goals (current goals can now be found in the care plan section) Progress towards PT goals: Progressing toward goals    Frequency    Min  5X/week      PT Plan      Co-evaluation              AM-PAC PT "6 Clicks" Mobility   Outcome Measure  Help needed turning from your back to your side while in a flat bed without using bedrails?: None Help needed moving from lying on your back to sitting on the side of a flat bed without using bedrails?: None Help needed moving to and from a bed to a chair (including a wheelchair)?: None Help needed standing up from a chair using your arms (e.g., wheelchair or bedside chair)?: A Little Help needed to walk in hospital room?: A Little Help needed climbing 3-5 steps with a railing? : Total 6 Click Score: 19    End of Session Equipment Utilized During Treatment: Gait belt Activity Tolerance: Patient tolerated treatment well Patient left: in chair;with call bell/phone within reach Nurse Communication: Mobility status PT Visit Diagnosis: Other abnormalities of gait and mobility (R26.89)     Time: 7829-5621 PT Time Calculation (min) (ACUTE ONLY): 32 min  Charges:    $Gait Training: 8-22 mins $Therapeutic Exercise: 8-22 mins PT General Charges $$ ACUTE PT VISIT: 1 Visit                    Bess Broody  PTA Acute  Rehabilitation Services Office M-F          [phone]

## 2023-10-12 NOTE — Progress Notes (Signed)
 Physical Therapy Treatment Patient Details Name: Nicholas Lam MRN: [id number] DOB: [birth year] Today's Date: 10/12/2023   History of Present Illness Pt is a 68 year old male s/p R THA 10/11/23. PMH includes PAF, s/p IVC filter (2009), DM2    PT Comments  POD # 1 pm session Niece present during session.  Had Pt perform multiple transfers.  General transfer comment: Pt self able with increased time and excessive use B UE's transfering in/out of his power wheelcahir as well as on/off toilet. Practiced getting back into bed from his power wc.   Addressed all mobility questions, discussed appropriate activity, educated on use of ICE.  Pt ready for D/C to home.    If plan is discharge home, recommend the following: Assist for transportation;A little help with walking and/or transfers;A little help with bathing/dressing/bathroom;Assistance with cooking/housework   Can travel by private vehicle        Equipment Recommendations  None recommended by PT    Recommendations for Other Services       Precautions / Restrictions Precautions Precautions: Fall Restrictions Weight Bearing Restrictions Per Provider Order: No RLE Weight Bearing Per Provider Order: Weight bearing as tolerated     Mobility  Bed Mobility Overal bed mobility: Needs Assistance Bed Mobility: Sit to Supine     Practiced getting back to bed Sit to supine: Supervision, Contact guard assist   General bed mobility comments: demonstarted and instructed how to use a belt to self assist LE    Transfers Overall transfer level: Needs assistance Equipment used: Rolling walker (2 wheels) Transfers: Sit to/from Stand, Bed to chair/wheelchair/BSC Sit to Stand: Supervision, Contact guard assist           General transfer comment: Pt self able with increased time and excessive use B UE's transfering in/out of his power wheelcahir as well as on/off toilet.    Ambulation/Gait  Stairs              Wheelchair Mobility     Tilt Bed    Modified Rankin (Stroke Patients Only)       Balance                                            Communication Communication Communication: Impaired Factors Affecting Communication: Hearing impaired  Cognition Arousal: Alert Behavior During Therapy: WFL for tasks assessed/performed   PT - Cognitive impairments: No apparent impairments                       PT - Cognition Comments: AxO x 3 pleasant and motivated who lives home alone and uses a power wc for mobility (can walk short distance just doesn't trust himself)  HIGH FALL RISK Following commands: Intact      Cueing Cueing Techniques: Verbal cues  Exercises      General Comments        Pertinent Vitals/Pain Pain Assessment Pain Assessment: 0-10 Pain Score: 5  Pain Location: R hip Pain Descriptors / Indicators: Discomfort, Grimacing, Operative site guarding Pain Intervention(s): Monitored during session, Premedicated before session, Repositioned, Ice applied    Home Living                          Prior Function            PT Goals (current goals can now  be found in the care plan section) Progress towards PT goals: Progressing toward goals    Frequency    Min 5X/week      PT Plan      Co-evaluation              AM-PAC PT "6 Clicks" Mobility   Outcome Measure  Help needed turning from your back to your side while in a flat bed without using bedrails?: None Help needed moving from lying on your back to sitting on the side of a flat bed without using bedrails?: None Help needed moving to and from a bed to a chair (including a wheelchair)?: None Help needed standing up from a chair using your arms (e.g., wheelchair or bedside chair)?: A Little Help needed to walk in hospital room?: A Little Help needed climbing 3-5 steps with a railing? : Total 6 Click Score: 19    End of Session Equipment Utilized During  Treatment: Gait belt Activity Tolerance: Patient tolerated treatment well Patient left: in chair;with call bell/phone within reach Nurse Communication: Mobility status PT Visit Diagnosis: Other abnormalities of gait and mobility (R26.89)     Time: 5284-1324 PT Time Calculation (min) (ACUTE ONLY): 36 min  Charges:    $Gait Training: 8-22 mins $Therapeutic Activity: 8-22 mins PT General Charges $$ ACUTE PT VISIT: 1 Visit                     Bess Broody  PTA Acute  Rehabilitation Services Office M-F          [phone]

## 2023-10-12 NOTE — Progress Notes (Signed)
   Subjective: 1 Day Post-Op Procedure(s) (LRB): ARTHROPLASTY, HIP, TOTAL, ANTERIOR APPROACH (Right) Patient reports pain as mild.   Patient seen in rounds with Dr. Bernard Brick. Patient is resting in bed on exam this morning. No acute events overnight. Foley catheter removed. Patient ambulated a few feet with PT yesterday. Reports pain in his lower thigh.  We will start therapy today.   Objective: Vital signs in last 24 hours: Temp:  [96.2 F (35.7 C)-97.7 F (36.5 C)] 97.7 F (36.5 C) (04/30 0623) Pulse Rate:  [56-79] 56 (04/30 0623) Resp:  [12-24] 16 (04/30 0623) BP: (83-122)/(52-74) 120/69 (04/30 0623) SpO2:  [95 %-100 %] 98 % (04/30 0623)  Intake/Output from previous day:  Intake/Output Summary (Last 24 hours) at 10/12/2023 0832 Last data filed at 10/12/2023 0600 Gross per 24 hour  Intake 1874.46 ml  Output 2100 ml  Net -225.54 ml     Intake/Output this shift: No intake/output data recorded.  Labs: Recent Labs    10/12/23 0335  HGB 12.6*   Recent Labs    10/12/23 0335  WBC 9.4  RBC 4.16*  HCT 37.1*  PLT 180   Recent Labs    10/12/23 0335  NA 135  K 4.3  CL 103  CO2 21*  BUN 22  CREATININE 0.99  GLUCOSE 144*  CALCIUM 8.7*   No results for input(s): "LABPT", "INR" in the last 72 hours.  Exam: General - Patient is Alert and Oriented Extremity - Neurologically intact Sensation intact distally Intact pulses distally Dorsiflexion/Plantar flexion intact Dressing - dressing C/D/I Motor Function - intact, moving foot and toes well on exam.   Past Medical History:  Diagnosis Date   Arthritis    Chronic kidney disease    Dysrhythmia    History of DVT (deep vein thrombosis)    History of kidney stones    Left retinal detachment    Paroxysmal atrial fibrillation (HCC)    Type 2 diabetes mellitus (HCC)     Assessment/Plan: 1 Day Post-Op Procedure(s) (LRB): ARTHROPLASTY, HIP, TOTAL, ANTERIOR APPROACH (Right) Principal Problem:   S/P total right hip  arthroplasty  Estimated body mass index is 31.57 kg/m as calculated from the following:   Height as of this encounter: 5\' 10"  (1.778 m).   Weight as of this encounter: 99.8 kg. Advance diet Up with therapy D/C IV fluids  DVT Prophylaxis - Aspirin  Weight bearing as tolerated.  Femoral head sent to pathology because of lesion seen on imaging - will await results, but so far suggested to be benign  We reviewed the MRSA/MSSA protocol today, and there should be discharge information given on this protocol  Up with PT today He has been very limited at baseline / using motorized wheelchair so we are shooting for him to be able to do similar activity now to discharge home  Much easier for him to go home today while his niece is in town   Plan for discharge today after meeting goals with therapy. Follow up in the office in 2 weeks.   Kim Pen, PA-C Orthopedic Surgery [phone] 10/12/2023, 8:32 AM

## 2023-10-12 NOTE — Plan of Care (Signed)
  Problem: Education: Goal: Knowledge of General Education information will improve Description: Including pain rating scale, medication(s)/side effects and non-pharmacologic comfort measures Outcome: Adequate for Discharge   Problem: Health Behavior/Discharge Planning: Goal: Ability to manage health-related needs will improve Outcome: Adequate for Discharge   Problem: Clinical Measurements: Goal: Ability to maintain clinical measurements within normal limits will improve Outcome: Adequate for Discharge Goal: Will remain free from infection Outcome: Adequate for Discharge Goal: Diagnostic test results will improve Outcome: Adequate for Discharge Goal: Respiratory complications will improve Outcome: Adequate for Discharge Goal: Cardiovascular complication will be avoided Outcome: Adequate for Discharge   Problem: Activity: Goal: Risk for activity intolerance will decrease Outcome: Adequate for Discharge   Problem: Nutrition: Goal: Adequate nutrition will be maintained Outcome: Adequate for Discharge   Problem: Coping: Goal: Level of anxiety will decrease Outcome: Adequate for Discharge   Problem: Elimination: Goal: Will not experience complications related to bowel motility Outcome: Adequate for Discharge Goal: Will not experience complications related to urinary retention Outcome: Adequate for Discharge   Problem: Pain Managment: Goal: General experience of comfort will improve and/or be controlled Outcome: Adequate for Discharge   Problem: Safety: Goal: Ability to remain free from injury will improve Outcome: Adequate for Discharge   Problem: Skin Integrity: Goal: Risk for impaired skin integrity will decrease Outcome: Adequate for Discharge   Problem: Education: Goal: Ability to describe self-care measures that may prevent or decrease complications (Diabetes Survival Skills Education) will improve Outcome: Adequate for Discharge Goal: Individualized Educational  Video(s) Outcome: Adequate for Discharge   Problem: Coping: Goal: Ability to adjust to condition or change in health will improve Outcome: Adequate for Discharge   Problem: Fluid Volume: Goal: Ability to maintain a balanced intake and output will improve Outcome: Adequate for Discharge   Problem: Health Behavior/Discharge Planning: Goal: Ability to identify and utilize available resources and services will improve Outcome: Adequate for Discharge Goal: Ability to manage health-related needs will improve Outcome: Adequate for Discharge   Problem: Metabolic: Goal: Ability to maintain appropriate glucose levels will improve Outcome: Adequate for Discharge   Problem: Nutritional: Goal: Maintenance of adequate nutrition will improve Outcome: Adequate for Discharge Goal: Progress toward achieving an optimal weight will improve Outcome: Adequate for Discharge   Problem: Skin Integrity: Goal: Risk for impaired skin integrity will decrease Outcome: Adequate for Discharge   Problem: Tissue Perfusion: Goal: Adequacy of tissue perfusion will improve Outcome: Adequate for Discharge   Problem: Education: Goal: Knowledge of the prescribed therapeutic regimen will improve Outcome: Adequate for Discharge Goal: Understanding of discharge needs will improve Outcome: Adequate for Discharge Goal: Individualized Educational Video(s) Outcome: Adequate for Discharge   Problem: Activity: Goal: Ability to avoid complications of mobility impairment will improve Outcome: Adequate for Discharge Goal: Ability to tolerate increased activity will improve Outcome: Adequate for Discharge   Problem: Clinical Measurements: Goal: Postoperative complications will be avoided or minimized Outcome: Adequate for Discharge   Problem: Pain Management: Goal: Pain level will decrease with appropriate interventions Outcome: Adequate for Discharge   Problem: Skin Integrity: Goal: Will show signs of wound  healing Outcome: Adequate for Discharge

## 2023-10-12 NOTE — Care Management Obs Status (Signed)
 MEDICARE OBSERVATION STATUS NOTIFICATION   Patient Details  Name: Nicholas Lam MRN: [id number] Date of Birth: [birth year]   Medicare Observation Status Notification Given:  Yes    Bari Leys, RN 10/12/2023, 9:40 AM

## 2023-10-13 LAB — SURGICAL PATHOLOGY

## 2023-10-19 ENCOUNTER — Encounter: Payer: Self-pay | Admitting: Student

## 2023-10-19 ENCOUNTER — Ambulatory Visit (INDEPENDENT_AMBULATORY_CARE_PROVIDER_SITE_OTHER): Admitting: Student

## 2023-10-19 VITALS — BP 112/67 | HR 92 | Temp 97.6°F | Ht 70.0 in | Wt 216.7 lb

## 2023-10-19 DIAGNOSIS — M533 Sacrococcygeal disorders, not elsewhere classified: Secondary | ICD-10-CM | POA: Diagnosis not present

## 2023-10-19 DIAGNOSIS — Z6831 Body mass index (BMI) 31.0-31.9, adult: Secondary | ICD-10-CM

## 2023-10-19 DIAGNOSIS — R251 Tremor, unspecified: Secondary | ICD-10-CM

## 2023-10-19 DIAGNOSIS — Z96641 Presence of right artificial hip joint: Secondary | ICD-10-CM

## 2023-10-19 DIAGNOSIS — Z7409 Other reduced mobility: Secondary | ICD-10-CM | POA: Insufficient documentation

## 2023-10-19 DIAGNOSIS — E1140 Type 2 diabetes mellitus with diabetic neuropathy, unspecified: Secondary | ICD-10-CM | POA: Diagnosis present

## 2023-10-19 DIAGNOSIS — E66811 Obesity, class 1: Secondary | ICD-10-CM

## 2023-10-19 DIAGNOSIS — E038 Other specified hypothyroidism: Secondary | ICD-10-CM

## 2023-10-19 DIAGNOSIS — I480 Paroxysmal atrial fibrillation: Secondary | ICD-10-CM | POA: Diagnosis not present

## 2023-10-19 DIAGNOSIS — B3749 Other urogenital candidiasis: Secondary | ICD-10-CM

## 2023-10-19 DIAGNOSIS — R1311 Dysphagia, oral phase: Secondary | ICD-10-CM

## 2023-10-19 NOTE — Patient Instructions (Addendum)
 Stop taking Ozempic .  I recommend eliminating sugary beverages like soda, sweet tea, and juice entirely. I recommended more vegetables, lean protein, and legumes. Frozen vegetables are healthy and inexpensive. Beans are a healthy and inexpensive source of lean protein and fiber. I recommend gradually increasing exercise. A daily walk is a great way to start an exercise program.  Stop taking fluconazole . Call if UTI symptoms return.  For your tremor, we'll check your thyroid health.  If your swallowing problem gets worse, please let me know.  Remember to bring all of the medications that you take (including over the counter medications and supplements) with you to every clinic visit.  This after visit summary is an important review of tests, referrals, and medication changes that were discussed during your visit. If you have questions or concerns, call [phone]. Outside of clinic business hours, call the main hospital at [phone] and ask the operator for the on-call internal medicine resident.   Adria Hopkins MD 10/19/2023, 2:14 PM

## 2023-10-19 NOTE — Progress Notes (Signed)
 Patient name: Nicholas Lam Date of birth: [birth year] Date of visit: 10/19/23  Subjective  Chief Complaint  Patient presents with   Follow-up    HPI Nicholas Lam is here for follow-up.  Had right hip replaced a few weeks ago. He can bear weight on the hip with physical therapy.  His UTI symptoms have improved. He wants to stop his fluconazole .  Doing well on Ozempic . No side-effects, feels good about his weight loss but doesn't want to lose more weight.  Mild tremor, worse with action or grasping.  He has lots of phlegm that collects in his throat when he drinks milk that is difficult to swallow.  Sometimes food sticks in the back of his throat.  ROS Energy level is good. Mood is improved. Sleep is poor. Appetite is okay. Not eating enough fiber. Constipated. No diarrhea. No nausea, no vomiting.   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   Pain due to onychomycosis of toenails of both feet 02/05/2020   Porokeratosis 02/05/2020   Paroxysmal atrial fibrillation (HCC) 08/11/2017   Amblyopia of left eye 04/16/2016   Hypertropia of right eye 04/16/2016   PVD (posterior vitreous detachment), bilateral 04/16/2016   Obstruction of left ureteropelvic junction (UPJ) due to stone 01/14/2016   Metatarsalgia 11/23/2015   Adenoma of left adrenal gland 09/04/2015   Hepatic cyst 09/04/2015   Diabetes mellitus with diabetic neuropathy (HCC) 09/04/2015   Ventral hernia without obstruction or gangrene 09/04/2015   Left retinal detachment 08/29/2015   Neuropathy of left sciatic  nerve 08/29/2015   Obesity, Class I, BMI 30-34.9 08/29/2015   Sensorineural hearing loss (SNHL) of both ears 08/29/2015   Strabismus, mechanical 08/29/2015   Past Medical History:  Diagnosis Date   Arthritis    Chronic kidney disease    Dysrhythmia    History of DVT (deep vein thrombosis)    History of kidney stones    Left retinal detachment    Paroxysmal atrial fibrillation (HCC)    Type 2 diabetes mellitus (HCC)    Outpatient Encounter Medications as of 10/19/2023  Medication Sig Note   acetaminophen  (TYLENOL ) 500 MG tablet Take 2 tablets (1,000 mg total) by mouth every 6 (six) hours.    apixaban  (ELIQUIS ) 5 MG TABS tablet Take 1 tablet (5 mg total) by mouth 2 (two) times daily.    ASHWAGANDHA PO Take 450 mg by mouth once a week.    Bromelains (BROMELAIN PO) Take 500 mg by mouth every 3 (three) days. 09/26/2023: Alternate ibuprofen, acetaminophen , and bromelain on a rotating schedule, taking one of the three every third day   chlorhexidine  (HIBICLENS ) 4 % external liquid Apply 15 mLs (1 Application total) topically as directed for 30 doses. Use as directed daily for 5 days every other week for 6 weeks.    Cholecalciferol (VITAMIN D3 PO) Take 10,000 Units by mouth once a week.    clotrimazole (LOTRIMIN) 1 % cream Apply 1 Application topically 2 (two) times daily as needed (yeast (irritation)).    CRANBERRY-D MANNOSE PO Take 1 capsule by mouth daily as needed (urinary  tract issues.).    Magnesium Bisglycinate (MAG GLYCINATE PO) Take 200 mg by mouth 2 (two) times a week.    Methenamine-Sodium Salicylate (AZO URINARY TRACT DEFENSE PO) Take 1 Application by mouth daily as needed (uti symptoms/irritation.). Azo Urinary Tract Defense Antibacterial    methocarbamol  (ROBAXIN ) 500 MG tablet Take 1 tablet (500 mg total) by mouth every 6 (six) hours as needed for muscle spasms.    Multiple Vitamin (MULTIVITAMIN WITH MINERALS) TABS tablet Take 1 tablet by mouth every 14 (fourteen) days.    mupirocin   ointment (BACTROBAN ) 2 % Place 1 Application into the nose 2 (two) times daily for 60 doses. Use as directed 2 times daily for 5 days every other week for 6 weeks.    nystatin -triamcinolone  (MYCOLOG II) cream Apply topically 2 (two) times daily. (Patient taking differently: Apply 1 Application topically 2 (two) times daily as needed (skin irritation.).)    polyethylene glycol (MIRALAX  / GLYCOLAX ) 17 g packet Take 17 g by mouth 2 (two) times daily.    traMADol  (ULTRAM ) 50 MG tablet Take 1-2 tablets (50-100 mg total) by mouth every 6 (six) hours as needed for moderate pain (pain score 4-6) or severe pain (pain score 7-10).    [DISCONTINUED] fluconazole  (DIFLUCAN ) 100 MG tablet Take 1 tablet by mouth once daily.    [DISCONTINUED] OZEMPIC , 1 MG/DOSE, 4 MG/3ML SOPN Inject 1 mg into the skin every Thursday. 09/26/2023: Last dose:09/22/23   [DISCONTINUED] Semaglutide , 2 MG/DOSE, (OZEMPIC , 2 MG/DOSE,) 8 MG/3ML SOPN Inject 2 mg into the skin once a week. (Patient not taking: Reported on 09/26/2023)    No facility-administered encounter medications on file as of 10/19/2023.   Past Surgical History:  Procedure Laterality Date   BACK SURGERY  1997   CATARACT EXTRACTION, BILATERAL     CORONARY ANGIOPLASTY     CORONARY ULTRASOUND/IVUS Bilateral 11/24/2021   Procedure: Intravascular Ultrasound/IVUS;  Surgeon: Margherita Shell, MD;  Location: [facility];  Service: Cardiovascular;  Laterality: Bilateral;  IVC BIL LOWER EXT   EYE SURGERY     FOOT SURGERY Right    LOWER EXTREMITY VENOGRAPHY Bilateral 11/24/2021   Procedure: LOWER EXTREMITY VENOGRAPHY;  Surgeon: Margherita Shell, MD;  Location: [facility];  Service: Cardiovascular;  Laterality: Bilateral;   PERIPHERAL VASCULAR BALLOON ANGIOPLASTY Bilateral 11/24/2021   Procedure: PERIPHERAL VASCULAR BALLOON ANGIOPLASTY;  Surgeon: Margherita Shell, MD;  Location: [facility];  Service: Cardiovascular;  Laterality: Bilateral;   PERIPHERAL  VASCULAR THROMBECTOMY Bilateral 11/24/2021   Procedure: PERIPHERAL VASCULAR THROMBECTOMY;  Surgeon: Margherita Shell, MD;  Location: [facility];  Service: Cardiovascular;  Laterality: Bilateral;  LOWER EXT   REPAIR OF COMPLEX TRACTION RETINAL DETACHMENT Left    skin transplant Right    TOTAL HIP ARTHROPLASTY Right 10/11/2023   Procedure: ARTHROPLASTY, HIP, TOTAL, ANTERIOR APPROACH;  Surgeon: Claiborne Crew, MD;  Location: [facility];  Service: Orthopedics;  Laterality: Right;   URETEROSCOPY WITH HOLMIUM LASER LITHOTRIPSY     No family history on file. Social History   Socioeconomic History   Marital status: Single    Spouse name: Not on file   Number of children: Not on file   Years of education: Not on file   Highest education level: Not on file  Occupational History   Not on file  Tobacco Use   Smoking status: Never   Smokeless tobacco: Never  Vaping Use   Vaping status: Never Used  Substance and Sexual Activity   Alcohol  use: Never   Drug use: Never   Sexual activity: Not on file  Other Topics Concern   Not on file  Social History Narrative   Not on file   Social Drivers of Health   Financial Resource Strain: Low Risk  (08/10/2023)   Overall Financial Resource Strain (CARDIA)    Difficulty of Paying Living Expenses: Not hard at all  Food Insecurity: No Food Insecurity (10/11/2023)   Hunger Vital Sign    Worried About Running Out of Food in the Last Year: Never true    Ran Out of Food in the Last Year: Never true  Transportation Needs: No Transportation Needs (10/11/2023)   PRAPARE - Administrator, Civil Service (Medical): No    Lack of Transportation (Non-Medical): No  Recent Concern: Transportation Needs - Unmet Transportation Needs (08/10/2023)   PRAPARE - Transportation    Lack of Transportation (Medical): Yes    Lack of Transportation (Non-Medical): Yes  Physical Activity: Inactive (08/10/2023)   Exercise Vital Sign    Days of Exercise per Week: 0  days    Minutes of Exercise per Session: 0 min  Stress: No Stress Concern Present (08/10/2023)   [employer] of Occupational Health - Occupational Stress Questionnaire    Feeling of Stress : Not at all  Social Connections: Moderately Isolated (10/11/2023)   Social Connection and Isolation Panel [NHANES]    Frequency of Communication with Friends and Family: More than three times a week    Frequency of Social Gatherings with Friends and Family: Once a week    Attends Religious Services: Never    Database administrator or Organizations: No    Attends Engineer, structural: 1 to 4 times per year    Marital Status: Divorced  Catering manager Violence: Not At Risk (10/11/2023)   Humiliation, Afraid, Rape, and Kick questionnaire    Fear of Current or Ex-Partner: No    Emotionally Abused: No    Physically Abused: No    Sexually Abused: No     Objective  Today's Vitals   10/19/23 1317  BP: 112/67  Pulse: 92  Temp: 97.6 F (36.4 C)  TempSrc: Oral  SpO2: 99%  Weight: 216 lb 11.2 oz (98.3 kg)  Height: 5\' 10"  (1.778 m)  Body mass index is 31.09 kg/m.   Physical Exam Not in distress, sitting upright in wheelchair No cervical lymphadenopathy No thyromegaly Oropharynx is moist and pink without erythema or obstruction Heart rate and rhythms normal, radial pulses are strong, no murmurs, no lower extremity edema Breathing comfortably on room air, lungs are clear Skin is warm and dry External anal exam is normal without hemorrhoids or visible fissures Alert and oriented, speech is normal sounding, left eye deviated down and in, tremor that resolves with rest and is worse with finger nose testing   Assessment & Plan  Problem List Items Addressed This Visit     Diabetes mellitus with diabetic neuropathy (HCC) - Primary   Improved control on GLP-1 agonist.  He really wants to stop this medicine.  We talked about dietary changes that could help achieve control without  medication.  Follow-up in 3 months for weight check and A1c.      Yeast UTI   No UTI symptoms for a while.  He has a urologist in the [facility] system.  Will stop the fluconazole  today.      S/P total right hip arthroplasty   Doing well.  Is able to get up  with 1-2 people assisting.  Working with physical therapy.  Has an appoint with Dr. Bernard Brick upcoming.      Coccyx pain   No bruising or wounds.  No hemorrhoids on external anal exam.  Internal exam deferred.  His UTI symptoms have actually resolved so I doubt this is prostatitis.      Tremor   Intention tremor, not present at rest.  Doubt tremor of Parkinson's.  Check TSH, although no other signs of hypothyroid state.  Declined starting propranolol.      Relevant Orders   TSH   Oral phase dysphagia   Occasional food sticking but typically after consumption of dairy products.  Symptoms mainly described as "phlegm" that accumulates and is difficult to swallow.  Oral exam is normal. I was able to clearly visualize the back of his oropharynx.  No alarm features, and this isn't esophageal dysphagia anyway.  He will call the clinic if the symptoms progress.      Obesity, Class I, BMI 30-34.9 (Chronic)   Body mass index is 31.09 kg/m.  I recommend eliminating sugary beverages like soda, sweet tea, and juice entirely. I recommended more vegetables, lean protein, and legumes. Frozen vegetables are healthy and inexpensive. Beans are a healthy and inexpensive source of lean protein and fiber. I recommend gradually increasing exercise. A daily walk is a great way to start an exercise program.  Referral: no  Pharmacological intervention: no, GLP-1 agonist discontinued today       Paroxysmal atrial fibrillation (HCC) (Chronic)   Stable.  Not in A-fib today.  Continue Eliquis .      Return in about 3 months (around 01/19/2024) for diabetes.  Adria Hopkins MD 10/19/2023, 4:55 PM

## 2023-10-19 NOTE — Assessment & Plan Note (Signed)
 Stable.  Not in A-fib today.  Continue Eliquis .

## 2023-10-19 NOTE — Assessment & Plan Note (Addendum)
 Intention tremor, not present at rest.  Doubt tremor of Parkinson's.  Check TSH, although no other signs of hypothyroid state.  Declined starting propranolol.

## 2023-10-19 NOTE — Assessment & Plan Note (Signed)
 Occasional food sticking but typically after consumption of dairy products.  Symptoms mainly described as "phlegm" that accumulates and is difficult to swallow.  Oral exam is normal. I was able to clearly visualize the back of his oropharynx.  No alarm features, and this isn't esophageal dysphagia anyway.  He will call the clinic if the symptoms progress.

## 2023-10-19 NOTE — Assessment & Plan Note (Signed)
 Improved control on GLP-1 agonist.  He really wants to stop this medicine.  We talked about dietary changes that could help achieve control without medication.  Follow-up in 3 months for weight check and A1c.

## 2023-10-19 NOTE — Assessment & Plan Note (Signed)
 Doing well.  Is able to get up with 1-2 people assisting.  Working with physical therapy.  Has an appoint with Dr. Bernard Brick upcoming.

## 2023-10-19 NOTE — Assessment & Plan Note (Signed)
 No UTI symptoms for a while.  He has a urologist in the [place] system.  Will stop the fluconazole  today.

## 2023-10-19 NOTE — Assessment & Plan Note (Signed)
 Body mass index is 31.09 kg/m.  I recommend eliminating sugary beverages like soda, sweet tea, and juice entirely. I recommended more vegetables, lean protein, and legumes. Frozen vegetables are healthy and inexpensive. Beans are a healthy and inexpensive source of lean protein and fiber. I recommend gradually increasing exercise. A daily walk is a great way to start an exercise program.  Referral: no  Pharmacological intervention: no, GLP-1 agonist discontinued today

## 2023-10-19 NOTE — Assessment & Plan Note (Signed)
 No bruising or wounds.  No hemorrhoids on external anal exam.  Internal exam deferred.  His UTI symptoms have actually resolved so I doubt this is prostatitis.

## 2023-10-20 ENCOUNTER — Ambulatory Visit: Payer: Self-pay

## 2023-10-20 ENCOUNTER — Encounter: Payer: Self-pay | Admitting: Student

## 2023-10-20 ENCOUNTER — Other Ambulatory Visit: Payer: Self-pay | Admitting: Student

## 2023-10-20 DIAGNOSIS — R7989 Other specified abnormal findings of blood chemistry: Secondary | ICD-10-CM

## 2023-10-20 DIAGNOSIS — E038 Other specified hypothyroidism: Secondary | ICD-10-CM

## 2023-10-20 LAB — TSH: TSH: 5.67 u[IU]/mL — ABNORMAL HIGH (ref 0.450–4.500)

## 2023-10-20 NOTE — Addendum Note (Signed)
 Addended by: Manfred Seed on: 10/20/2023 03:14 PM   Modules accepted: Orders

## 2023-10-20 NOTE — Telephone Encounter (Signed)
 Copied from CRM [id number]. Topic: Clinical - Red Word Triage >> Oct 20, 2023  1:24 PM Carrielelia G wrote: [facility] that prompted transfer to Nurse Triage: pain is at a 2 bruising right leg, s/p operation hip replacement. Patient is on blood thinners, apixaban  (ELIQUIS ) 5 MG TABS tablet   Chief Complaint: Bruising Symptoms: large bruise on right hip, near surgical site Frequency: constant Pertinent Negatives: Patient denies pain Disposition: [] ED /[] Urgent Care (no appt availability in office) / [] Appointment(In office/virtual)/ []  [facility]/ [] Home Care/ [] Refused Recommended Disposition /[] [facility]/ []  Follow-up with PCP Additional Notes: Pt reports large bruise on right hip, near surgical site. Unsure of onset, as it was first noticed by [facility] PT today. Pt denies pain or any new injuries, but advises that he's been taking Eliquis  since the surgery. Pt declining appt at this time, states he was seen by PCP on 5/7 and would prefer to share pictures of the bruise via MyChart first and get recommendations from PCP from there. Will notify clinic via HP message.  Reason for Disposition  Taking Coumadin (warfarin) or other strong blood thinner, or known bleeding disorder (e.g., thrombocytopenia)  (Exception: Very small, painless bruise at heparin  injection site.)  Answer Assessment - Initial Assessment Questions 1. APPEARANCE of BRUISE: "Describe the bruise."      Dark purple  2. SIZE: "How large is the bruise?"      10inches long, 4 inches wide  3. NUMBER: "How many bruises are there?"      1 bruise  4. LOCATION: "Where is the bruise located?"      Right hip, near surgical site  5. ONSET: "How long ago did the bruise occur?"      Unsure of onset, noticed by PT today  6. CAUSE: "Tell me how it happened."     Total RIGHT hip replacement  7. MEDICAL HISTORY: "Do you have any medical problems that can cause easy bruising or bleeding?" (e.g., leukemia, liver disease,  recent chemotherapy)     S/p hip replacement  8. MEDICINES: "Do you take any medications which thin the blood such as: aspirin , heparin , ibuprofen (NSAIDS), Plavix, or Coumadin?"     Taking eliquis   9. OTHER SYMPTOMS: "Do you have any other symptoms?"  (e.g., weakness, dizziness, pain, fever, nosebleed, blood in urine/stool)     *No Answer*  Protocols used: Bruises-A-AH

## 2023-10-25 ENCOUNTER — Ambulatory Visit: Payer: Self-pay | Admitting: Student

## 2023-10-25 NOTE — Discharge Summary (Signed)
 Patient ID: Nicholas Lam MRN: [id number] DOB/AGE: 1956/05/07 68 y.o.  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 male who was admitted 10/11/2023 for operative treatment ofS/P total right hip arthroplasty. Patient has severe unremitting pain that affects sleep, daily activities, and work/hobbies. After pre-op clearance the patient was taken to the operating room on 10/11/2023 and underwent  Procedure(s): ARTHROPLASTY, HIP, TOTAL, ANTERIOR APPROACH.    Patient was given perioperative antibiotics:  Anti-infectives (From admission, onward)    Start     Dose/Rate Route Frequency Ordered Stop   10/11/23 1400  fluconazole  (DIFLUCAN ) tablet 100 mg  Status:  Discontinued        100 mg Oral Daily 10/11/23 1311 10/12/23 2026   10/11/23 1400  ceFAZolin  (ANCEF ) IVPB 2g/100 mL premix        2 g 200 mL/hr over 30 Minutes Intravenous Every 6 hours 10/11/23 1311 10/12/23 0831   10/11/23 0645  ceFAZolin  (ANCEF ) IVPB 2g/100 mL premix        2 g 200 mL/hr over 30 Minutes Intravenous On call to O.R. 10/11/23 4098 10/11/23 0848        Patient was  given sequential compression devices, early ambulation, and chemoprophylaxis to prevent DVT. Patient worked with PT and was meeting their goals regarding safe ambulation and transfers.  Patient benefited maximally from hospital stay and there were no complications.    Recent vital signs: No data found.   Recent laboratory studies: No results for input(s): "WBC", "HGB", "HCT", "PLT", "NA", "K", "CL", "CO2", "BUN", "CREATININE", "GLUCOSE", "INR", "CALCIUM" in the last 72 hours.  Invalid input(s): "PT", "2"   Discharge Medications:   Allergies as of 10/12/2023       Reactions   Iodinated Contrast Media Hives, Rash, Swelling   Iodine  Hives, Swelling, Rash        Medication List     STOP taking these medications    ibuprofen 200 MG tablet Commonly known as: ADVIL       TAKE these medications    acetaminophen  500 MG tablet Commonly known as: TYLENOL  Take 2 tablets (1,000 mg total) by mouth every 6 (six) hours. What changed:  how much to take when to take this   apixaban  5 MG Tabs tablet Commonly known as: ELIQUIS  Take 1 tablet (5 mg total) by mouth 2 (two) times daily.   ASHWAGANDHA PO Take 450 mg by mouth once a week.   AZO URINARY TRACT DEFENSE PO Take 1 Application by mouth daily as needed (uti symptoms/irritation.). Azo Urinary Tract Defense Antibacterial   BROMELAIN PO Take 500 mg  by mouth every 3 (three) days.   chlorhexidine  4 % external liquid Commonly known as: HIBICLENS  Apply 15 mLs (1 Application total) topically as directed for 30 doses. Use as directed daily for 5 days every other week for 6 weeks.   clotrimazole 1 % cream Commonly known as: LOTRIMIN Apply 1 Application topically 2 (two) times daily as needed (yeast (irritation)).   CRANBERRY-D MANNOSE PO Take 1 capsule by mouth daily as needed (urinary tract issues.).   MAG GLYCINATE PO Take 200 mg by mouth 2 (two) times a week.   methocarbamol  500 MG tablet Commonly known as: ROBAXIN  Take  1 tablet (500 mg total) by mouth every 6 (six) hours as needed for muscle spasms.   multivitamin with minerals Tabs tablet Take 1 tablet by mouth every 14 (fourteen) days.   mupirocin  ointment 2 % Commonly known as: BACTROBAN  Place 1 Application into the nose 2 (two) times daily for 60 doses. Use as directed 2 times daily for 5 days every other week for 6 weeks.   nystatin -triamcinolone  cream Commonly known as: MYCOLOG II Apply topically 2 (two) times daily. What changed:  how much to take when to take this reasons to take this   polyethylene glycol 17 g packet Commonly known as: MIRALAX  / GLYCOLAX  Take 17 g by mouth 2 (two) times daily.   traMADol  50 MG tablet Commonly known as: ULTRAM  Take 1-2 tablets (50-100 mg total) by mouth every 6 (six) hours as needed for moderate pain (pain score 4-6) or severe pain (pain score 7-10).   VITAMIN D3 PO Take 10,000 Units by mouth once a week.               Discharge Care Instructions  (From admission, onward)           Start     Ordered   10/12/23 0000  Change dressing       Comments: Maintain surgical dressing until follow up in the clinic. If the edges start to pull up, may reinforce with tape. If the dressing is no longer working, may remove and cover with gauze and tape, but must keep the area dry and clean.  Call with any questions or concerns.   10/12/23 0836            Diagnostic Studies: DG Pelvis Portable Result Date: 10/11/2023 CLINICAL DATA:  Status post right hip arthroplasty. EXAM: PORTABLE PELVIS 1-2 VIEWS COMPARISON:  None Available. FINDINGS: Right hip arthroplasty in expected alignment. No periprosthetic lucency or fracture. Recent postsurgical change includes air and edema in the soft tissues. IMPRESSION: Right hip arthroplasty without immediate postoperative complication. Electronically Signed   By: Chadwick Colonel M.D.   On: 10/11/2023 10:42   DG HIP UNILAT WITH PELVIS 1V RIGHT Result Date:  10/11/2023 CLINICAL DATA:  Elective surgery. EXAM: DG HIP (WITH OR WITHOUT PELVIS) 1V RIGHT COMPARISON:  None Available. FINDINGS: Seven fluoroscopic spot views of the pelvis and right hip obtained in the operating room. Sequential images during hip arthroplasty. Fluoroscopy time 12 seconds. Dose 1.42 mGy. IMPRESSION: Intraoperative fluoroscopy during right hip arthroplasty. Electronically Signed   By: Chadwick Colonel M.D.   On: 10/11/2023 10:42   DG C-Arm 1-60 Min-No Report Result Date: 10/11/2023 Fluoroscopy was utilized by the requesting physician.  No radiographic interpretation.   DG C-Arm 1-60 Min-No Report Result Date: 10/11/2023 Fluoroscopy was utilized by the requesting physician.  No radiographic interpretation.   CT HIP RIGHT WO CONTRAST Result Date: 10/03/2023 CLINICAL DATA:  Severe  right hip pain. Preop for total hip arthroplasty. EXAM: CT OF THE RIGHT HIP WITHOUT CONTRAST TECHNIQUE: Multidetector CT imaging of the right hip was performed according to the standard protocol. Multiplanar CT image reconstructions were also generated. RADIATION DOSE REDUCTION: This exam was performed according to the departmental dose-optimization program which includes automated exposure control, adjustment of the mA and/or kV according to patient size and/or use of iterative reconstruction technique. COMPARISON:  CT scan 11/19/2021 FINDINGS: Severe degenerative changes involving the right hip with marked joint space narrowing with bone-on-bone appearance, osteophytic spurring, subchondral cystic changes and bony eburnation. No fracture or AVN. There is a large lesion and occupying the proximal right femur and extending into the lower femoral neck. This has a rings and arcs appearance of calcification without it discrete sclerotic border. It is unchanged since 2023 and could be an enchondroma, bone infarct, late phase fibrous dysplasia or possibly liposclerosing myxofibrous tumor (LSMFT). The pubic symphysis and  right SI joint are intact. No right-sided pelvic bone lesions or fracture. The right hip and pelvic musculature are grossly normal by CT. No obvious muscle tear or intramuscular hematoma. Mild fatty atrophy. No significant right-sided intrapelvic abnormalities are identified. IMPRESSION: 1. Severe degenerative changes involving the right hip. 2. No fracture or AVN. 3. Large lesion occupying the proximal right femur and extending into the lower femoral neck. This has a rings and arcs appearance of calcification without discrete sclerotic border. It is unchanged since 2023 and could be an enchondroma, bone infarct, late phase fibrous dysplasia or possibly liposclerosing myxofibrous tumor (LSMFT). Electronically Signed   By: Marrian Siva M.D.   On: 10/03/2023 16:52    Disposition: Discharge disposition: 01-Home or Self Care       Discharge Instructions     Call MD / Call 911   Complete by: As directed    If you experience chest pain or shortness of breath, CALL 911 and be transported to the hospital emergency room.  If you develope a fever above 101 F, pus (white drainage) or increased drainage or redness at the wound, or calf pain, call your surgeon's office.   Change dressing   Complete by: As directed    Maintain surgical dressing until follow up in the clinic. If the edges start to pull up, may reinforce with tape. If the dressing is no longer working, may remove and cover with gauze and tape, but must keep the area dry and clean.  Call with any questions or concerns.   Constipation Prevention   Complete by: As directed    Drink plenty of fluids.  Prune juice may be helpful.  You may use a stool softener, such as Colace (over the counter) 100 mg twice a day.  Use MiraLax  (over the counter) for constipation as needed.   Diet - low sodium heart healthy   Complete by: As directed    Increase activity slowly as tolerated   Complete by: As directed    Weight bearing as tolerated with assist  device (walker, cane, etc) as directed, use it as long as suggested by your surgeon or therapist, typically at least 4-6 weeks.   Post-operative opioid taper instructions:   Complete by: As directed    POST-OPERATIVE OPIOID TAPER INSTRUCTIONS: It is important to wean off of your opioid medication as soon as possible. If you do not need pain medication after your surgery it is ok to stop day one. Opioids include: Codeine, Hydrocodone(Norco, Vicodin), Oxycodone (Percocet, oxycontin ) and hydromorphone  amongst others.  Long term and even short term use of opiods can cause: Increased pain response Dependence Constipation Depression Respiratory depression And more.  Withdrawal symptoms can include Flu like symptoms Nausea, vomiting And more Techniques to manage these symptoms Hydrate well Eat regular healthy meals Stay active Use relaxation techniques(deep breathing, meditating, yoga) Do Not substitute Alcohol to help with tapering If you have been on opioids for less than two weeks and do not have pain than it is ok to stop all together.  Plan to wean off of opioids This plan should start within one week post op of your joint replacement. Maintain the same interval or time between taking each dose and first decrease the dose.  Cut the total daily intake of opioids by one tablet each day Next start to increase the time between doses. The last dose that should be eliminated is the evening dose.      TED hose   Complete by: As directed    Use stockings (TED hose) for 2 weeks on both leg(s).  You may remove them at night for sleeping.        Follow-up Information     Claiborne Crew, MD. Schedule an appointment as soon as possible for a visit in 2 week(s).   Specialty: Orthopedic Surgery Contact information: [address] [place] [address] [phone]         Care, [facility] Follow up.   Specialty: Home Health Services Why: Home Health Physical  Therapy and Occupational Therapy Contact information: [address] [place] [address] [phone]                  Signed: Earnie Gola 10/25/2023, 7:13 AM

## 2023-10-27 NOTE — Progress Notes (Signed)
 Internal Medicine Clinic Attending  Case discussed with the resident at the time of the visit.  We reviewed the resident's history and exam and pertinent patient test results.  I agree with the assessment, diagnosis, and plan of care documented in the resident's note.

## 2023-11-06 ENCOUNTER — Observation Stay (HOSPITAL_COMMUNITY)

## 2023-11-06 ENCOUNTER — Other Ambulatory Visit: Payer: Self-pay

## 2023-11-06 ENCOUNTER — Emergency Department (HOSPITAL_COMMUNITY)

## 2023-11-06 ENCOUNTER — Observation Stay (HOSPITAL_COMMUNITY): Admission: EM | Admit: 2023-11-06 | Discharge: 2023-11-08 | Disposition: A | Discharge status: Home / Self Care

## 2023-11-06 DIAGNOSIS — Z79899 Other long term (current) drug therapy: Secondary | ICD-10-CM | POA: Insufficient documentation

## 2023-11-06 DIAGNOSIS — N189 Chronic kidney disease, unspecified: Secondary | ICD-10-CM | POA: Diagnosis not present

## 2023-11-06 DIAGNOSIS — Z7901 Long term (current) use of anticoagulants: Secondary | ICD-10-CM | POA: Insufficient documentation

## 2023-11-06 DIAGNOSIS — N3000 Acute cystitis without hematuria: Secondary | ICD-10-CM

## 2023-11-06 DIAGNOSIS — I480 Paroxysmal atrial fibrillation: Secondary | ICD-10-CM | POA: Diagnosis not present

## 2023-11-06 DIAGNOSIS — Z96641 Presence of right artificial hip joint: Secondary | ICD-10-CM | POA: Insufficient documentation

## 2023-11-06 DIAGNOSIS — N390 Urinary tract infection, site not specified: Secondary | ICD-10-CM | POA: Insufficient documentation

## 2023-11-06 DIAGNOSIS — R41841 Cognitive communication deficit: Secondary | ICD-10-CM | POA: Insufficient documentation

## 2023-11-06 DIAGNOSIS — R296 Repeated falls: Secondary | ICD-10-CM | POA: Insufficient documentation

## 2023-11-06 DIAGNOSIS — E1122 Type 2 diabetes mellitus with diabetic chronic kidney disease: Secondary | ICD-10-CM | POA: Insufficient documentation

## 2023-11-06 DIAGNOSIS — I609 Nontraumatic subarachnoid hemorrhage, unspecified: Secondary | ICD-10-CM

## 2023-11-06 DIAGNOSIS — S62201A Unspecified fracture of first metacarpal bone, right hand, initial encounter for closed fracture: Secondary | ICD-10-CM | POA: Insufficient documentation

## 2023-11-06 DIAGNOSIS — R519 Headache, unspecified: Secondary | ICD-10-CM | POA: Diagnosis present

## 2023-11-06 DIAGNOSIS — S62514A Nondisplaced fracture of proximal phalanx of right thumb, initial encounter for closed fracture: Secondary | ICD-10-CM

## 2023-11-06 DIAGNOSIS — S066X0A Traumatic subarachnoid hemorrhage without loss of consciousness, initial encounter: Principal | ICD-10-CM | POA: Insufficient documentation

## 2023-11-06 DIAGNOSIS — Z86718 Personal history of other venous thrombosis and embolism: Secondary | ICD-10-CM | POA: Insufficient documentation

## 2023-11-06 DIAGNOSIS — Z23 Encounter for immunization: Secondary | ICD-10-CM | POA: Diagnosis not present

## 2023-11-06 LAB — COMPREHENSIVE METABOLIC PANEL WITH GFR
ALT: 19 U/L (ref 0–44)
AST: 21 U/L (ref 15–41)
Albumin: 2.9 g/dL — ABNORMAL LOW (ref 3.5–5.0)
Alkaline Phosphatase: 100 U/L (ref 38–126)
Anion gap: 9 (ref 5–15)
BUN: 16 mg/dL (ref 8–23)
CO2: 20 mmol/L — ABNORMAL LOW (ref 22–32)
Calcium: 8.8 mg/dL — ABNORMAL LOW (ref 8.9–10.3)
Chloride: 104 mmol/L (ref 98–111)
Creatinine, Ser: 1.11 mg/dL (ref 0.61–1.24)
GFR, Estimated: >60 mL/min (ref >60)
Glucose, Bld: 190 mg/dL — ABNORMAL HIGH (ref 70–99)
Potassium: 4.2 mmol/L (ref 3.5–5.1)
Sodium: 133 mmol/L — ABNORMAL LOW (ref 135–145)
Total Bilirubin: 0.9 mg/dL (ref 0.0–1.2)
Total Protein: 6.1 g/dL — ABNORMAL LOW (ref 6.5–8.1)

## 2023-11-06 LAB — URINALYSIS, ROUTINE W REFLEX MICROSCOPIC
Bilirubin Urine: NEGATIVE
Glucose, UA: 150 mg/dL — AB
Ketones, ur: NEGATIVE mg/dL
Nitrite: POSITIVE — AB
Protein, ur: 30 mg/dL — AB
Specific Gravity, Urine: 1.015 (ref 1.005–1.030)
WBC, UA: >50 WBC/hpf (ref 0–5)
pH: 5 (ref 5.0–8.0)

## 2023-11-06 LAB — CBC
HCT: 41.2 % (ref 39.0–52.0)
Hemoglobin: 13.6 g/dL (ref 13.0–17.0)
MCH: 30.2 pg (ref 26.0–34.0)
MCHC: 33 g/dL (ref 30.0–36.0)
MCV: 91.4 fL (ref 80.0–100.0)
Platelets: 225 10*3/uL (ref 150–400)
RBC: 4.51 MIL/uL (ref 4.22–5.81)
RDW: 14.8 % (ref 11.5–15.5)
WBC: 6.4 10*3/uL (ref 4.0–10.5)
nRBC: 0 % (ref 0.0–0.2)

## 2023-11-06 LAB — I-STAT CHEM 8, ED
BUN: 17 mg/dL (ref 8–23)
Calcium, Ion: 1.14 mmol/L — ABNORMAL LOW (ref 1.15–1.40)
Chloride: 102 mmol/L (ref 98–111)
Creatinine, Ser: 1.1 mg/dL (ref 0.61–1.24)
Glucose, Bld: 193 mg/dL — ABNORMAL HIGH (ref 70–99)
HCT: 40 % (ref 39.0–52.0)
Hemoglobin: 13.6 g/dL (ref 13.0–17.0)
Potassium: 4.2 mmol/L (ref 3.5–5.1)
Sodium: 135 mmol/L (ref 135–145)
TCO2: 20 mmol/L — ABNORMAL LOW (ref 22–32)

## 2023-11-06 LAB — TYPE AND SCREEN
ABO/RH(D): O POS
Antibody Screen: NEGATIVE

## 2023-11-06 LAB — ETHANOL: Alcohol, Ethyl (B): <15 mg/dL (ref <15)

## 2023-11-06 MED ORDER — ONDANSETRON HCL 4 MG/2ML IJ SOLN: 4.0000 mg | Freq: Four times a day (QID) | INTRAMUSCULAR | Status: DC | PRN

## 2023-11-06 MED ORDER — DIPHENHYDRAMINE HCL 25 MG PO CAPS: 50.0000 mg | ORAL_CAPSULE | Freq: Once | ORAL | Status: AC

## 2023-11-06 MED ORDER — METHYLPREDNISOLONE SODIUM SUCC 40 MG IJ SOLR
40.0000 mg | Freq: Once | INTRAMUSCULAR | Status: AC
  Administered 2023-11-06: 40 mg via INTRAVENOUS
  Filled 2023-11-06: qty 1

## 2023-11-06 MED ORDER — TETANUS-DIPHTH-ACELL PERTUSSIS 5-2.5-18.5 LF-MCG/0.5 IM SUSY
0.5000 mL | PREFILLED_SYRINGE | Freq: Once | INTRAMUSCULAR | Status: AC
  Administered 2023-11-06: 0.5 mL via INTRAMUSCULAR
  Filled 2023-11-06: qty 0.5

## 2023-11-06 MED ORDER — POLYETHYLENE GLYCOL 3350 17 G PO PACK: 17.0000 g | PACK | Freq: Every day | ORAL | Status: DC | PRN

## 2023-11-06 MED ORDER — METOPROLOL TARTRATE 5 MG/5ML IV SOLN: 5.0000 mg | Freq: Four times a day (QID) | INTRAVENOUS | Status: DC | PRN

## 2023-11-06 MED ORDER — METHOCARBAMOL 500 MG PO TABS
500.0000 mg | ORAL_TABLET | Freq: Three times a day (TID) | ORAL | Status: DC
  Filled 2023-11-06: qty 1

## 2023-11-06 MED ORDER — MORPHINE SULFATE (PF) 4 MG/ML IV SOLN: 4.0000 mg | INTRAVENOUS | Status: DC | PRN

## 2023-11-06 MED ORDER — DIPHENHYDRAMINE HCL 50 MG/ML IJ SOLN
50.0000 mg | Freq: Once | INTRAMUSCULAR | Status: AC
  Administered 2023-11-06: 50 mg via INTRAVENOUS
  Filled 2023-11-06: qty 1

## 2023-11-06 MED ORDER — METHOCARBAMOL 1000 MG/10ML IJ SOLN
500.0000 mg | Freq: Three times a day (TID) | INTRAMUSCULAR | Status: DC
  Filled 2023-11-06: qty 10

## 2023-11-06 MED ORDER — ONDANSETRON 4 MG PO TBDP: 4.0000 mg | ORAL_TABLET | Freq: Four times a day (QID) | ORAL | Status: DC | PRN

## 2023-11-06 MED ORDER — DOCUSATE SODIUM 100 MG PO CAPS
100.0000 mg | ORAL_CAPSULE | Freq: Two times a day (BID) | ORAL | Status: DC
  Administered 2023-11-08: 100 mg via ORAL
  Filled 2023-11-06 (×2): qty 1

## 2023-11-06 MED ORDER — ACETAMINOPHEN 500 MG PO TABS
1000.0000 mg | ORAL_TABLET | Freq: Four times a day (QID) | ORAL | Status: DC
  Administered 2023-11-06 – 2023-11-08 (×6): 1000 mg via ORAL
  Filled 2023-11-06 (×6): qty 2

## 2023-11-06 MED ORDER — HYDRALAZINE HCL 20 MG/ML IJ SOLN: 10.0000 mg | INTRAMUSCULAR | Status: DC | PRN

## 2023-11-06 MED ORDER — SODIUM CHLORIDE 0.9 % IV SOLN
1.0000 g | Freq: Once | INTRAVENOUS | Status: AC
  Administered 2023-11-06: 1 g via INTRAVENOUS
  Filled 2023-11-06: qty 10

## 2023-11-06 MED ORDER — TRAMADOL HCL 50 MG PO TABS: 50.0000 mg | ORAL_TABLET | ORAL | Status: DC | PRN

## 2023-11-06 MED ORDER — IOHEXOL 350 MG/ML SOLN
75.0000 mL | Freq: Once | INTRAVENOUS | Status: AC | PRN
  Administered 2023-11-06: 75 mL via INTRAVENOUS

## 2023-11-06 NOTE — ED Triage Notes (Signed)
 BIB [facility] from home, patient was on his electric wheelchair going up a curb when his chair tipped over and patient hit his head onto a concrete ground. Patient did not have any LOC, on blood thinners.  Patient complains of pain in thumb 5/10. Patient noted to have hematoma to left forehead, with abrasions to bilateral toes.

## 2023-11-06 NOTE — Progress Notes (Addendum)
 Orthopedic Tech Progress Note Patient Details:  [name] [birth year] [id number]  Level II trauma. Pt is needing a R thumb spica splint. D/t the IV being located in an area that will be covered by the ace wraps of the splint, Judie Noun RN agreed to call whenever that is changed or removed.   Patient ID: Nicholas Lam, male   DOB: 06-May-1956, 68 y.o.   MRN: [id number]  Cozette Divine 11/06/2023, 11:03 AM

## 2023-11-06 NOTE — Progress Notes (Signed)
 Trauma Event Note    Pt is allergic to contrast dye- states when he had an IVP 40 years ago he developed hives and a local reaction at the IV site- Dr. Aniceto Barley notified--- will do the 4 hour protocol for contrast allergy and include his repeat head CT at that time.   Last imported Vital Signs BP 114/67   Pulse 62   Temp (!) 97.5 F (36.4 C) (Oral)   Resp 16   Ht 5\' 10"  (1.778 m)   Wt 220 lb (99.8 kg)   SpO2 100%   BMI 31.57 kg/m   Trending CBC Recent Labs    11/06/23 0952 11/06/23 0957  WBC 6.4  --   HGB 13.6 13.6  HCT 41.2 40.0  PLT 225  --     Trending Coag's No results for input(s): "APTT", "INR" in the last 72 hours.  Trending BMET Recent Labs    11/06/23 0952 11/06/23 0957  NA 133* 135  K 4.2 4.2  CL 104 102  CO2 20*  --   BUN 16 17  CREATININE 1.11 1.10  GLUCOSE 190* 193*      Anders Hohmann M Ebrima Ranta  Trauma Response RN  Please call TRN at [phone] for further assistance.

## 2023-11-06 NOTE — Progress Notes (Signed)
 PT Cancellation Note  Patient Details Name: Nicholas Lam MRN: [id number] DOB: 10-May-1956   Cancelled Treatment:    Reason Eval/Treat Not Completed: Medical issues which prohibited therapy. Pt pending pelvic CT and with recent hip surgery. Will follow up tomorrow.   Pura Browns [facility] 11/06/2023, 2:07 PM Angelina Kempf PT Acute [employer] [phone]

## 2023-11-06 NOTE — Progress Notes (Signed)
 Patient arrived to the unit from ED via stretcher. Pt was admitted to room 4NP09, he alert and oriented and is able to make own decisions. VS stable, no s/s of acute neuro distress noted. Patient was made comfortable in his room with call bell within reach, and bed at lowest position.
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
## 2023-11-06 NOTE — ED Notes (Signed)
 Ortho tech is unable to apply thumb spica at this time due to the IV being located on lower forearm, need to notify ortho tech when we are able to apply thumb spica.

## 2023-11-06 NOTE — ED Notes (Signed)
 At 1901 patient was offer  his schedule tylenol  1,000 mg tylenol . Patient took one 500 mg tylenol  and spit out the second 500 and refuse to take it.The tylenol  was thrown in the sharp box.

## 2023-11-06 NOTE — Progress Notes (Signed)
 Transition of Care [facility]) - CAGE-AID Screening   Patient Details  Name: Nicholas Lam MRN: [id number] Date of Birth: 11-26-1955   Asa Bjork, RN Trauma Response Nurse Phone Number: [phone] 11/06/2023, 4:45 PM      CAGE-AID Screening:    Have You Ever Felt You Ought to Cut Down on Your Drinking or Drug Use?: No Have People Annoyed You By Critizing Your Drinking Or Drug Use?: No Have You Felt Bad Or Guilty About Your Drinking Or Drug Use?: No Have You Ever Had a Drink or Used Drugs First Thing In The Morning to Steady Your Nerves or to Get Rid of a Hangover?: No CAGE-AID Score: 0  Substance Abuse Education Offered: (S) No (No services needed- denies use of drugs/alcohol)

## 2023-11-06 NOTE — Progress Notes (Signed)
 Orthopedic Tech Progress Note Patient Details:  Nicholas Lam 1955/09/14 [id number]  Ortho Devices Type of Ortho Device: Thumb spica splint Splint Material: Fiberglass Ortho Device/Splint Location: RUE Ortho Device/Splint Interventions: Ordered, Application, Adjustment   Post Interventions Patient Tolerated: Fair Instructions Provided: Care of device, Adjustment of device  [name] [name] 11/06/2023, 7:27 PM

## 2023-11-06 NOTE — Progress Notes (Signed)
 Noted periprosthetic femur fx. Notified Dr. Bernard Brick, patients' original surgeon. Recs for hip and pelvis films, will make recommendations based on this imaging. Will see in AM.
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
## 2023-11-06 NOTE — Progress Notes (Signed)
 OT Cancellation Note  Patient Details Name: Nicholas Lam MRN: [id number] DOB: [birth year]   Cancelled Treatment:    Reason Eval/Treat Not Completed: Patient not medically ready (per chart, pending hip CT; will follow up afterward as appropriate.)  Loneta Tamplin D Walton, OTD, OTR/L [facility] Office: [phone]   Emery Hans 11/06/2023, 2:01 PM
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
## 2023-11-06 NOTE — ED Notes (Signed)
 CT stated will be ready for him by 1040

## 2023-11-07 ENCOUNTER — Observation Stay (HOSPITAL_COMMUNITY)

## 2023-11-07 DIAGNOSIS — S066X0A Traumatic subarachnoid hemorrhage without loss of consciousness, initial encounter: Secondary | ICD-10-CM | POA: Diagnosis not present

## 2023-11-07 LAB — CBC
HCT: 37.2 % — ABNORMAL LOW (ref 39.0–52.0)
HCT: 39.8 % (ref 39.0–52.0)
Hemoglobin: 12.6 g/dL — ABNORMAL LOW (ref 13.0–17.0)
Hemoglobin: 13.4 g/dL (ref 13.0–17.0)
MCH: 30.1 pg (ref 26.0–34.0)
MCH: 30.3 pg (ref 26.0–34.0)
MCHC: 33.9 g/dL (ref 30.0–36.0)
MCHC: 33.7 g/dL (ref 30.0–36.0)
MCV: 88.8 fL (ref 80.0–100.0)
MCV: 90 fL (ref 80.0–100.0)
Platelets: 220 10*3/uL (ref 150–400)
Platelets: 241 10*3/uL (ref 150–400)
RBC: 4.19 MIL/uL — ABNORMAL LOW (ref 4.22–5.81)
RBC: 4.42 MIL/uL (ref 4.22–5.81)
RDW: 14.6 % (ref 11.5–15.5)
RDW: 14.6 % (ref 11.5–15.5)
WBC: 5.1 10*3/uL (ref 4.0–10.5)
WBC: 10.8 10*3/uL — ABNORMAL HIGH (ref 4.0–10.5)
nRBC: 0 % (ref 0.0–0.2)
nRBC: 0 % (ref 0.0–0.2)

## 2023-11-07 LAB — BASIC METABOLIC PANEL WITH GFR
Anion gap: 8 (ref 5–15)
BUN: 17 mg/dL (ref 8–23)
CO2: 21 mmol/L — ABNORMAL LOW (ref 22–32)
Calcium: 8.4 mg/dL — ABNORMAL LOW (ref 8.9–10.3)
Chloride: 103 mmol/L (ref 98–111)
Creatinine, Ser: 1.11 mg/dL (ref 0.61–1.24)
GFR, Estimated: >60 mL/min (ref >60)
Glucose, Bld: 258 mg/dL — ABNORMAL HIGH (ref 70–99)
Potassium: 4.2 mmol/L (ref 3.5–5.1)
Sodium: 132 mmol/L — ABNORMAL LOW (ref 135–145)

## 2023-11-07 MED ORDER — SODIUM CHLORIDE 0.9 % IV SOLN
1.0000 g | INTRAVENOUS | Status: DC
  Administered 2023-11-07 – 2023-11-08 (×2): 1 g via INTRAVENOUS
  Filled 2023-11-07 (×2): qty 10

## 2023-11-07 NOTE — Plan of Care (Signed)
  Problem: Education: Goal: Knowledge of General Education information will improve Description: Including pain rating scale, medication(s)/side effects and non-pharmacologic comfort measures Outcome: Progressing   Problem: Clinical Measurements: Goal: Ability to maintain clinical measurements within normal limits will improve Outcome: Progressing Goal: Respiratory complications will improve Outcome: Progressing   Problem: Nutrition: Goal: Adequate nutrition will be maintained Outcome: Progressing   Problem: Coping: Goal: Level of anxiety will decrease Outcome: Progressing   Problem: Elimination: Goal: Will not experience complications related to urinary retention Outcome: Progressing   Problem: Pain Managment: Goal: General experience of comfort will improve and/or be controlled Outcome: Progressing   Problem: Safety: Goal: Ability to remain free from injury will improve Outcome: Progressing
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
## 2023-11-07 NOTE — Progress Notes (Signed)
 Pt off unit for CT scan.

## 2023-11-07 NOTE — Progress Notes (Signed)
 Case d/w [facility]VS regarding AC. Recommendations are for resuming of Eliquis  when cleared by NSGY given his history of extensive DVT and despite the IVCF. If Eliquis  were discontinued, patient would be at extremely high risk of clotting his IVCF and would be a high risk surgical candidate for thrombectomy and potentially not a surgical candidate. With resuming Eliquis  in the setting of history of numerous falls, patient is at extremely high risk of devastating intracranial hemorrhage.    Anda Bamberg, MD General and Trauma Surgery [facility]

## 2023-11-07 NOTE — Progress Notes (Signed)
 Progress Note     Subjective: Pt denies significant pain. Concerned about UTI. Again confirms he would not go to SNF or acute rehab.   Objective: Vital signs in last 24 hours: Temp:  [97.7 F (36.5 C)-98 F (36.7 C)] 97.7 F (36.5 C) (05/26 0716) Pulse Rate:  [53-69] 62 (05/26 0716) Resp:  [14-30] 19 (05/26 0716) BP: (99-135)/(57-86) 121/80 (05/26 0716) SpO2:  [93 %-100 %] 97 % (05/26 0716) Last BM Date : 11/03/23  Intake/Output from previous day: No intake/output data recorded. Intake/Output this shift: No intake/output data recorded.  PE: General: pleasant, WD, WN male, NAD HEENT: ecchymosis of left temple  Heart: pedal pulses intact BL, sinus tachycardia in the 100s Lungs: CTAB, no wheezes, rhonchi, or rales noted.  Respiratory effort nonlabored Abd: soft, NT, ND,  MS: splint to R forearm, fingers WWP Skin: warm and dry with no masses, lesions, or rashes Neuro: Cranial nerves 2-12 grossly intact, sensation is normal throughout Psych: A&Ox3 with an appropriate affect.    Lab Results:  Recent Labs    11/06/23 0952 11/06/23 0957 11/07/23 0512  WBC 6.4  --  5.1  HGB 13.6 13.6 12.6*  HCT 41.2 40.0 37.2*  PLT 225  --  220   BMET Recent Labs    11/06/23 0952 11/06/23 0957 11/07/23 0512  NA 133* 135 132*  K 4.2 4.2 4.2  CL 104 102 103  CO2 20*  --  21*  GLUCOSE 190* 193* 258*  BUN 16 17 17   CREATININE 1.11 1.10 1.11  CALCIUM 8.8*  --  8.4*   PT/INR No results for input(s): "LABPROT", "INR" in the last 72 hours. CMP     Component Value Date/Time   NA 132 (L) 11/07/2023 0512   NA 135 10/07/2022 1644   K 4.2 11/07/2023 0512   CL 103 11/07/2023 0512   CO2 21 (L) 11/07/2023 0512   GLUCOSE 258 (H) 11/07/2023 0512   BUN 17 11/07/2023 0512   BUN 16 10/07/2022 1644   CREATININE 1.11 11/07/2023 0512   CALCIUM 8.4 (L) 11/07/2023 0512   PROT 6.1 (L) 11/06/2023 0952   PROT 6.6 10/07/2022 1644   ALBUMIN  2.9 (L) 11/06/2023 0952   ALBUMIN  3.7 (L)  10/07/2022 1644   AST 21 11/06/2023 0952   ALT 19 11/06/2023 0952   ALKPHOS 100 11/06/2023 0952   BILITOT 0.9 11/06/2023 0952   BILITOT 0.4 10/07/2022 1644   GFRNONAA >60 11/07/2023 0512   Lipase  No results found for: "LIPASE"     Studies/Results: DG HIP UNILAT WITH PELVIS 2-3 VIEWS RIGHT Result Date: 11/07/2023 CLINICAL DATA:  Fall, right hip surgery EXAM: DG HIP (WITH OR WITHOUT PELVIS) 2-3V RIGHT COMPARISON:  CT chest abdomen pelvis 11/06/2023 FINDINGS: Right THA. Acute displaced periprosthetic femur fracture about the right THA. The greater trochanter is mildly displaced superiorly and laterally. IMPRESSION: Acute displaced periprosthetic femur fracture about the right THA. Electronically Signed   By: Rozell Cornet M.D.   On: 11/07/2023 01:00   CT CHEST ABDOMEN PELVIS W CONTRAST Result Date: 11/06/2023 CLINICAL DATA:  Marvell Slider out of wheelchair EXAM: CT CHEST, ABDOMEN, AND PELVIS WITH CONTRAST TECHNIQUE: Multidetector CT imaging of the chest, abdomen and pelvis was performed following the standard protocol during bolus administration of intravenous contrast. RADIATION DOSE REDUCTION: This exam was performed according to the departmental dose-optimization program which includes automated exposure control, adjustment of the mA and/or kV according to patient size and/or use of iterative reconstruction technique. CONTRAST:  75mL OMNIPAQUE   IOHEXOL  350 MG/ML SOLN COMPARISON:  08/31/2022, 10/11/2023 FINDINGS: CT CHEST FINDINGS Cardiovascular: The heart is unremarkable without pericardial effusion. No evidence of thoracic aortic aneurysm or dissection. No evidence of vascular injury. Atherosclerosis of the aorta and coronary vasculature. Mediastinum/Nodes: No enlarged mediastinal, hilar, or axillary lymph nodes. Thyroid gland, trachea, and esophagus demonstrate no significant findings. Lungs/Pleura: No acute airspace disease, effusion, or pneumothorax. Central airways are patent. Musculoskeletal: No  acute displaced fractures. Reconstructed images demonstrate no additional findings. CT ABDOMEN PELVIS FINDINGS Hepatobiliary: No hepatic injury or perihepatic hematoma. Gallbladder is unremarkable. Pancreas: Unremarkable. No pancreatic ductal dilatation or surrounding inflammatory changes. Spleen: No splenic injury or perisplenic hematoma. Adrenals/Urinary Tract: Stable left adrenal adenoma. Right adrenal is unremarkable. Bilateral renal cortical atrophy, left greater than right. No evidence of renal injury. No urinary tract calculi or obstructive uropathy. The bladder is unremarkable. Stomach/Bowel: No bowel obstruction or ileus. Colonic diverticulosis without diverticulitis. No bowel wall thickening or inflammatory change. Vascular/Lymphatic: Atherosclerosis of the aorta. IVC filter again noted. No pathologic adenopathy. Reproductive: Prostate is unremarkable. Other: No free fluid or free intraperitoneal gas. Prior ventral hernia repair, with persistent fat containing midline supraumbilical ventral hernia adjacent to prior surgical mesh. No bowel herniation. Musculoskeletal: Postsurgical changes are seen from right hip arthroplasty. Since the postoperative exam 10/11/2023, there is a periprosthetic fracture of the proximal right femur extending through the greater trochanter, with mild comminution and displacement. Distal margin of the femoral component of the prosthesis is not evaluated due to slice selection. No other acute displaced fractures. Chronic compression deformities of L1 and L3 unchanged. Stable multilevel spondylosis. Reconstructed images demonstrate no additional findings. IMPRESSION: 1. Comminuted minimally displaced periprosthetic proximal right femur fracture extending through the greater trochanter, new since postoperative x-ray obtained 10/11/2023 after right hip arthroplasty. 2. Otherwise no acute intrathoracic, intra-abdominal, or intrapelvic process. 3. Colonic diverticulosis without  diverticulitis. 4. Stable fat containing supraumbilical midline ventral hernia. 5.  Aortic Atherosclerosis (ICD10-I70.0). Electronically Signed   By: Bobbye Burrow M.D.   On: 11/06/2023 22:27   CT HEAD WO CONTRAST Result Date: 11/06/2023 CLINICAL DATA:  Blunt poly trauma. EXAM: CT HEAD WITHOUT CONTRAST CT CERVICAL SPINE WITHOUT CONTRAST TECHNIQUE: Multidetector CT imaging of the head and cervical spine was performed following the standard protocol without intravenous contrast. Multiplanar CT image reconstructions of the cervical spine were also generated. RADIATION DOSE REDUCTION: This exam was performed according to the departmental dose-optimization program which includes automated exposure control, adjustment of the mA and/or kV according to patient size and/or use of iterative reconstruction technique. COMPARISON:  None Available. FINDINGS: CT HEAD FINDINGS Brain: Trace subarachnoid hemorrhage in two sulci at the superior right frontal convexity, correlating with the trauma history. No swelling or infarct. No hydrocephalus. Generalized cerebral volume loss. Vascular: No hyperdense vessel or unexpected calcification. Skull: Negative for fracture.  Left anterior scalp swelling. Sinuses/Orbits: No evidence of injury. Aspherical left globe with scleral buckle. Bilateral cataract resection. CT CERVICAL SPINE FINDINGS Alignment: No traumatic malalignment. Degenerative anterolisthesis at C2-3. Skull base and vertebrae: No acute fracture. No primary bone lesion or focal pathologic process. Generalized atrophy Soft tissues and spinal canal: No prevertebral fluid or swelling. No visible canal hematoma. Disc levels: Multilevel disc and facet degeneration with notable foraminal impingement on the left at C2-3 and C6-7. Upper chest: No acute finding Critical Value/emergent results were called by telephone at the time of interpretation on 11/06/2023 at 11:21 am to provider [facility], who verbally acknowledged these results.  IMPRESSION: Trace subarachnoid hemorrhage in 2  superior right frontal sulci correlating with the trauma history. Negative for cervical spine fracture. Electronically Signed   By: Ronnette Coke M.D.   On: 11/06/2023 11:24   CT CERVICAL SPINE WO CONTRAST Result Date: 11/06/2023 CLINICAL DATA:  Blunt poly trauma. EXAM: CT HEAD WITHOUT CONTRAST CT CERVICAL SPINE WITHOUT CONTRAST TECHNIQUE: Multidetector CT imaging of the head and cervical spine was performed following the standard protocol without intravenous contrast. Multiplanar CT image reconstructions of the cervical spine were also generated. RADIATION DOSE REDUCTION: This exam was performed according to the departmental dose-optimization program which includes automated exposure control, adjustment of the mA and/or kV according to patient size and/or use of iterative reconstruction technique. COMPARISON:  None Available. FINDINGS: CT HEAD FINDINGS Brain: Trace subarachnoid hemorrhage in two sulci at the superior right frontal convexity, correlating with the trauma history. No swelling or infarct. No hydrocephalus. Generalized cerebral volume loss. Vascular: No hyperdense vessel or unexpected calcification. Skull: Negative for fracture.  Left anterior scalp swelling. Sinuses/Orbits: No evidence of injury. Aspherical left globe with scleral buckle. Bilateral cataract resection. CT CERVICAL SPINE FINDINGS Alignment: No traumatic malalignment. Degenerative anterolisthesis at C2-3. Skull base and vertebrae: No acute fracture. No primary bone lesion or focal pathologic process. Generalized atrophy Soft tissues and spinal canal: No prevertebral fluid or swelling. No visible canal hematoma. Disc levels: Multilevel disc and facet degeneration with notable foraminal impingement on the left at C2-3 and C6-7. Upper chest: No acute finding Critical Value/emergent results were called by telephone at the time of interpretation on 11/06/2023 at 11:21 am to provider [facility],  who verbally acknowledged these results. IMPRESSION: Trace subarachnoid hemorrhage in 2 superior right frontal sulci correlating with the trauma history. Negative for cervical spine fracture. Electronically Signed   By: Ronnette Coke M.D.   On: 11/06/2023 11:24   DG Finger Thumb Right Result Date: 11/06/2023 CLINICAL DATA:  Fall from wheelchair with left thumb injury. EXAM: RIGHT THUMB 2+V COMPARISON:  None Available. FINDINGS: Examination demonstrates mild degenerate change over the first carpometacarpal joint and first MCP joints. Degenerative change over the second D IP joint. There is mild diffuse decreased bone mineralization present. There is an oblique displaced fracture over the proximal to mid shaft of the first metacarpal. IMPRESSION: Oblique displaced fracture over the proximal to mid shaft of the first metacarpal. Electronically Signed   By: Roda Cirri M.D.   On: 11/06/2023 09:45    Anti-infectives: Anti-infectives (From admission, onward)    Start     Dose/Rate Route Frequency Ordered Stop   11/06/23 1445  cefTRIAXone (ROCEPHIN) 1 g in sodium chloride  0.9 % 100 mL IVPB        1 g 200 mL/hr over 30 Minutes Intravenous  Once 11/06/23 1431 11/06/23 1537        Assessment/Plan  Fall out of wheelchair   R 1st MC fx - thumb spice splint, ortho hand c/s SAH - NSGY c/s, Dr. Nat Badger, repeat head CT in 6-12h, if stable okay to discharge, no reversal of Eliquis , hold Eliquis , . okay to resume Eliquis  in 7d from NSGY standpoint. SLp for cog eval.  Recent hip surgery - per Dr. Bernard Brick, non-op and WBAT H/o frequent falls - recommend permanent d/c of Eliquis , especially in light of the IVCF in place. Case d/w VVS regarding AC. Recommendations are for resuming of Eliquis  when cleared by NSGY given his history of extensive DVT and despite the IVCF. If Eliquis  were discontinued, patient would be at extremely high risk of clotting his  IVCF and would be a high risk surgical candidate for  thrombectomy and potentially not a surgical candidate. With resuming Eliquis  in the setting of history of numerous falls, patient is at extremely high risk of devastating intracranial hemorrhage.  DM - refuses inpatient or outpatient medications  FEN - regular diet DVT - SCDs, hold chemical ppx due to bleeding concerns ID - rocephin for UTI, UCx pending  Dispo - 4NP, therapies, CTH pending, CBC 1400  LOS: 0 days   I reviewed Consultant ortho, NS notes, last 24 h vitals and pain scores, last 48 h intake and output, last 24 h labs and trends, and last 24 h imaging results.  This care required moderate level of medical decision making.    Annetta Killian, [facility] 11/07/2023, 9:44 AM Please see Amion for pager number during day hours 7:00am-4:30pm

## 2023-11-07 NOTE — Progress Notes (Signed)
Pt return to unit from CT.

## 2023-11-07 NOTE — Consult Note (Signed)
   Providing Compassionate, Quality Care - Together  Neurosurgery Consult  Referring physician: EDP Reason for referral: Fall  History of Present Illness: Pt reportedly fell out of wheelchair yesterday and hit his head on a curb. He is s/p R THA on 4/29. H/o frequent DVT s/p IVCF. H/o afib.    Physical Exam:  BP 121/80 (BP Location: Left Arm)   Pulse 62   Temp 97.7 F (36.5 C) (Oral)   Resp 19   Ht 5\' 10"  (1.778 m)   Wt 99.8 kg   SpO2 97%   BMI 31.57 kg/m   PE: Awake, alert, oriented Speech fluent, appropriate CN grossly intact, esotropia MAEs, working at bedside with PT   Impression/Assessment:  This is a 68 yo with small R SAH s/p mechanical fall  Plan:  -Repeat CTH stable -Continue to hold Eliquis  7d  -Conservative care  Nicholas Diloreto CAYLIN Harlon Kutner, PA-C

## 2023-11-07 NOTE — Progress Notes (Signed)
 Occupational Therapy Evaluation Patient Details Name: Nicholas Lam MRN: [id number] DOB: [birth year] Today's Date: 11/07/2023   History of Present Illness   Pt is a 68 y.o. M who presents 11/06/2023 after a fall out of his w/c. Pt found to have minimally displaced right Ag periprosthetic fracture with stable femoral and acetabular components and right 1st, thumb metacarpal spiral fracture with minimal displacement as well as small SDH. PMH includes PAF, s/p IVC filter (2009), DM2, DVT, right THA 10/11/2023.     Clinical Impressions Patient evaluated by Occupational Therapy with no further acute OT needs identified. All education has been completed and the patient has no further questions. See below for any follow-up Occupational Therapy or equipment needs. OT to sign off. Thank you for referral.   *pt could benefit from a reduce thumb based splint to maximize independence     If plan is discharge home, recommend the following:         Functional Status Assessment   Patient has had a recent decline in their functional status and demonstrates the ability to make significant improvements in function in a reasonable and predictable amount of time.     Equipment Recommendations   None recommended by OT     Recommendations for Other Services         Precautions/Restrictions   Precautions Precautions: Fall Precaution/Restrictions Comments: Watch HR Restrictions Weight Bearing Restrictions Per Provider Order: No Other Position/Activity Restrictions: No active R hip abduction     Mobility Bed Mobility Overal bed mobility: Modified Independent             General bed mobility comments: Exiting left side of bed    Transfers Overall transfer level: Needs assistance Equipment used: None Transfers: Bed to chair/wheelchair/BSC   Stand pivot transfers: Supervision         General transfer comment: pt performs clockwise stand pivot transfers with BUE  support on w/c armrests. entry on the left side like at home      Balance Overall balance assessment: Needs assistance Sitting-balance support: Feet supported Sitting balance-Leahy Scale: Good     Standing balance support: Bilateral upper extremity supported Standing balance-Leahy Scale: Poor Standing balance comment: reliant on BUE support                           ADL either performed or assessed with clinical judgement   ADL Overall ADL's : Needs assistance/impaired Eating/Feeding: Set up   Grooming: Set up                                 General ADL Comments: demonstrates transfer to bed from w/c that is adequate and could complete toilet transfer based on this demo. pt uses urinals at baseline for bathroom transfer     Vision Baseline Vision/History: 1 Wears glasses Patient Visual Report: No change from baseline       Perception         Praxis         Pertinent Vitals/Pain Pain Assessment Pain Assessment: No/denies pain     Extremity/Trunk Assessment Upper Extremity Assessment Upper Extremity Assessment: Right hand dominant;RUE deficits/detail RUE Deficits / Details: able to utilize 2nd-5th digits for transfers. could benefit from a reduced thumb based splint at outpatient   Lower Extremity Assessment Lower Extremity Assessment: Defer to PT evaluation RLE Deficits / Details: Recent THA, now with minimally displaced greater  trochanteric fracture. Pt able to perform quad set and LAQ against gravity   Cervical / Trunk Assessment Cervical / Trunk Assessment: Kyphotic   Communication Communication Communication: Impaired Factors Affecting Communication: Hearing impaired   Cognition Arousal: Alert Behavior During Therapy: WFL for tasks assessed/performed Cognition: No apparent impairments                               Following commands: Intact       Cueing  General Comments   Cueing Techniques: Verbal  cues  VSS on RA   Exercises     Shoulder Instructions      Home Living Family/patient expects to be discharged to:: Private residence Living Arrangements: Alone Available Help at Discharge: Family Type of Home: Apartment Home Access: Level entry     Home Layout: One level     Bathroom Shower/Tub: Producer, television/film/video: Standard Bathroom Accessibility: Yes How Accessible: Accessible via wheelchair Home Equipment: Agricultural consultant (2 wheels);Rollator (4 wheels);Cane - single point;Electric scooter;Tub bench;Adaptive equipment (bidet attachments) Adaptive Equipment: Reacher Additional Comments: has aides during the week      Prior Functioning/Environment Prior Level of Function : Independent/Modified Independent             Mobility Comments: limited mobility, transfers to and from electric scooter. [facility] ADLs Comments: CNA in the house for bathing/ dressing helps with appointment if needed. Also uses lyft to help transport. He reports his [place] transportation ends at the end of this month.    OT Problem List:     OT Treatment/Interventions:        OT Goals(Current goals can be found in the care plan section)   Acute Rehab OT Goals Patient Stated Goal: to go home today after 4 Potential to Achieve Goals: Good   OT Frequency:       Co-evaluation              AM-PAC OT "6 Clicks" Daily Activity     Outcome Measure Help from another person eating meals?: A Little Help from another person taking care of personal grooming?: A Little Help from another person toileting, which includes using toliet, bedpan, or urinal?: A Little Help from another person bathing (including washing, rinsing, drying)?: A Little Help from another person to put on and taking off regular upper body clothing?: A Little Help from another person to put on and taking off regular lower body clothing?: A Little 6 Click Score: 18   End of Session Nurse  Communication: Mobility status;Precautions  Activity Tolerance: Patient tolerated treatment well Patient left: in bed;with call bell/phone within reach;with bed alarm set  OT Visit Diagnosis: Unsteadiness on feet (R26.81)                Time: 1610-9604 OT Time Calculation (min): 23 min Charges:  OT General Charges $OT Visit: 1 Visit OT Evaluation $OT Eval Low Complexity: 1 Low   Brynn, OTR/L  Acute Rehabilitation Services Office: [phone] .   Neomia Banner 11/07/2023, 11:43 AM

## 2023-11-07 NOTE — Discharge Instructions (Signed)
 Minimally displaced right periprosthetic greater trochanteric femur fracture with stable femoral and acetabular components: He will be weightbearing as tolerated with no active abduction for 6 to 8 weeks  Right thumb, first metacarpal minimally displaced spiral fracture: Maintain right thumb splint until follow-up with hand surgery in our office  Restart Eliquis  11/15/2023

## 2023-11-07 NOTE — Plan of Care (Signed)
  Problem: Education: Goal: Knowledge of General Education information will improve Description: Including pain rating scale, medication(s)/side effects and non-pharmacologic comfort measures Outcome: Progressing   Problem: Nutrition: Goal: Adequate nutrition will be maintained Outcome: Progressing   Problem: Coping: Goal: Level of anxiety will decrease Outcome: Progressing   Problem: Elimination: Goal: Will not experience complications related to bowel motility Outcome: Progressing   Problem: Safety: Goal: Ability to remain free from injury will improve Outcome: Progressing

## 2023-11-07 NOTE — Evaluation (Signed)
 Physical Therapy Evaluation Patient Details Name: Nicholas Lam MRN: [id number] DOB: 05-11-1956 Today's Date: 11/07/2023  History of Present Illness  Pt is a 68 y.o. M who presents 11/06/2023 after a fall out of his w/c. Pt found to have minimally displaced right Ag periprosthetic fracture with stable femoral and acetabular components and right 1st, thumb metacarpal spiral fracture with minimal displacement as well as small SDH. PMH includes PAF, s/p IVC filter (2009), DM2, DVT, right THA 10/11/2023.  Clinical Impression  Pt admitted with above. PTA, pt reports he was actively working with [facility], modI for stand pivot transfers to and from electric w/c, and has been working on limited ambulation with RW. Pt seems to be fairly close to his functional baseline. Pt able to thoroughly explain w/c parts and management and transfer set up prior to exiting the bed. Reviewed RLE WBAT and no active hip abduction, which pt verbalized understanding. Pt able to perform stand pivot transfer with no AD to w/c and then additional stand to RW with CGA. HR up to 154 bpm, SpO2 98% on RA (PA notified). Deferred further ambulation due to tachycardia. Of note, pt also able to functionally grip RW with R hand, despite thumb splint. Recommend continued follow up HHPT to continue to promote strengthening, balance, ROM, and functional mobility.         If plan is discharge home, recommend the following: A little help with walking and/or transfers;A little help with bathing/dressing/bathroom;Assistance with cooking/housework;Assist for transportation   Can travel by private vehicle        Equipment Recommendations None recommended by PT  Recommendations for Other Services       Functional Status Assessment Patient has had a recent decline in their functional status and demonstrates the ability to make significant improvements in function in a reasonable and predictable amount of time.     Precautions /  Restrictions Precautions Precautions: Fall Restrictions Weight Bearing Restrictions Per Provider Order: No Other Position/Activity Restrictions: No active R hip abduction      Mobility  Bed Mobility Overal bed mobility: Modified Independent             General bed mobility comments: Exiting left side of bed    Transfers Overall transfer level: Needs assistance Equipment used: None Transfers: Bed to chair/wheelchair/BSC   Stand pivot transfers: Contact guard assist         General transfer comment: Electric w/c set up on R side of bed, pt performs clockwise stand pivot transfers with BUE support on w/c armrests    Ambulation/Gait                  Stairs            Wheelchair Mobility     Tilt Bed    Modified Rankin (Stroke Patients Only)       Balance Overall balance assessment: Needs assistance Sitting-balance support: Feet supported Sitting balance-Leahy Scale: Good     Standing balance support: Bilateral upper extremity supported Standing balance-Leahy Scale: Poor Standing balance comment: reliant on BUE support                             Pertinent Vitals/Pain Pain Assessment Pain Assessment: No/denies pain    Home Living Family/patient expects to be discharged to:: Private residence Living Arrangements: Alone Available Help at Discharge: Family Type of Home: Apartment Home Access: Level entry       Home Layout:  One level Home Equipment: Agricultural consultant (2 wheels);Rollator (4 wheels);Cane - single point;Electric scooter      Prior Function Prior Level of Function : Independent/Modified Independent             Mobility Comments: limited mobility, transfers to and from electric scooter. [facility]       Extremity/Trunk Assessment   Upper Extremity Assessment Upper Extremity Assessment: Defer to OT evaluation    Lower Extremity Assessment Lower Extremity Assessment: RLE deficits/detail RLE Deficits  / Details: Recent THA, now with minimally displaced greater trochanteric fracture. Pt able to perform quad set and LAQ against gravity    Cervical / Trunk Assessment Cervical / Trunk Assessment: Kyphotic  Communication   Communication Communication: Impaired Factors Affecting Communication: Hearing impaired    Cognition Arousal: Alert Behavior During Therapy: WFL for tasks assessed/performed   PT - Cognitive impairments: No apparent impairments                         Following commands: Intact       Cueing Cueing Techniques: Verbal cues     General Comments      Exercises     Assessment/Plan    PT Assessment Patient needs continued PT services  PT Problem List Decreased strength;Decreased activity tolerance;Decreased balance;Decreased mobility       PT Treatment Interventions DME instruction;Gait training;Functional mobility training;Therapeutic activities;Therapeutic exercise;Balance training;Patient/family education    PT Goals (Current goals can be found in the Care Plan section)  Acute Rehab PT Goals Patient Stated Goal: go home PT Goal Formulation: With patient Time For Goal Achievement: 11/21/23 Potential to Achieve Goals: Good    Frequency Min 3X/week     Co-evaluation               AM-PAC PT "6 Clicks" Mobility  Outcome Measure Help needed turning from your back to your side while in a flat bed without using bedrails?: None Help needed moving from lying on your back to sitting on the side of a flat bed without using bedrails?: None Help needed moving to and from a bed to a chair (including a wheelchair)?: A Little Help needed standing up from a chair using your arms (e.g., wheelchair or bedside chair)?: A Little Help needed to walk in hospital room?: A Little Help needed climbing 3-5 steps with a railing? : Total 6 Click Score: 18    End of Session Equipment Utilized During Treatment: Gait belt Activity Tolerance: Patient  tolerated treatment well Patient left: with call bell/phone within reach;with chair alarm set;Other (comment) (in electric w/c) Nurse Communication: Mobility status PT Visit Diagnosis: Difficulty in walking, not elsewhere classified (R26.2);Unsteadiness on feet (R26.81)    Time: 1610-9604 PT Time Calculation (min) (ACUTE ONLY): 36 min   Charges:   PT Evaluation $PT Eval Moderate Complexity: 1 Mod PT Treatments $Therapeutic Activity: 8-22 mins PT General Charges $$ ACUTE PT VISIT: 1 Visit         Verdia Glad, PT, DPT Acute Rehabilitation Services Office [phone]   Claria Crofts 11/07/2023, 10:17 AM

## 2023-11-07 NOTE — Care Management Obs Status (Signed)
 MEDICARE OBSERVATION STATUS NOTIFICATION   Patient Details  Name: GARNETT NUNZIATA MRN: [id number] Date of Birth: 31-Jul-1955   Medicare Observation Status Notification Given:  Yes    Rox Cope, RN 11/07/2023, 4:15 PM

## 2023-11-07 NOTE — Evaluation (Signed)
 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   Porokeratosis 02/05/2020   Onychomycosis of multiple toenails with type 2 diabetes mellitus (HCC) 02/05/2020   Paroxysmal atrial fibrillation (HCC) 08/11/2017   Amblyopia of left eye 04/16/2016   Hypertropia of right eye 04/16/2016   PVD (posterior vitreous detachment), bilateral 04/16/2016   Metatarsalgia 11/23/2015   Adenoma of left adrenal gland 09/04/2015   Hepatic cyst 09/04/2015   Diabetes mellitus with diabetic neuropathy (HCC) 09/04/2015   Ventral hernia without obstruction or gangrene 09/04/2015   Left retinal detachment 08/29/2015   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   Obesity, Class I, BMI 30-34.9 08/29/2015   Sensorineural hearing loss (SNHL) of both ears 08/29/2015   Strabismus, mechanical 08/29/2015   Past Medical History:  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 01/14/2016   [facility] planning for PNL     Osteoarthritis of right hip 07/10/2022   Hip x-ray 07/20/2022: Advanced degenerative changes to the right hip joint with collapse of the femoral head      Paroxysmal atrial fibrillation (HCC)    Type 2 diabetes mellitus (HCC)    Past Surgical History:  Past Surgical History:   Procedure Laterality Date   BACK SURGERY  1997   CATARACT EXTRACTION, BILATERAL     CORONARY ANGIOPLASTY     CORONARY ULTRASOUND/IVUS Bilateral 11/24/2021   Procedure: Intravascular Ultrasound/IVUS;  Surgeon: Margherita Shell, MD;  Location: [facility];  Service: Cardiovascular;  Laterality: Bilateral;  IVC BIL LOWER EXT   EYE SURGERY     FOOT SURGERY Right    LOWER EXTREMITY VENOGRAPHY Bilateral 11/24/2021   Procedure: LOWER EXTREMITY VENOGRAPHY;  Surgeon: Margherita Shell, MD;  Location: [facility];  Service: Cardiovascular;  Laterality: Bilateral;   PERIPHERAL VASCULAR BALLOON ANGIOPLASTY Bilateral 11/24/2021   Procedure: PERIPHERAL VASCULAR BALLOON ANGIOPLASTY;  Surgeon: Margherita Shell, MD;  Location: [facility];  Service: Cardiovascular;  Laterality: Bilateral;   PERIPHERAL VASCULAR THROMBECTOMY Bilateral 11/24/2021   Procedure: PERIPHERAL VASCULAR THROMBECTOMY;  Surgeon: Margherita Shell, MD;  Location: [facility];  Service: Cardiovascular;  Laterality: Bilateral;  LOWER EXT   REPAIR OF COMPLEX TRACTION RETINAL DETACHMENT Left    skin transplant Right    TOTAL HIP ARTHROPLASTY Right 10/11/2023   Procedure: ARTHROPLASTY, HIP, TOTAL, ANTERIOR APPROACH;  Surgeon: Claiborne Crew, MD;  Location: [facility];  Service: Orthopedics;  Laterality: Right;   URETEROSCOPY WITH HOLMIUM LASER LITHOTRIPSY     HPI:  Pt is a 68 y.o. M who presents 11/06/2023 after a fall out of his w/c. Pt found to have minimally displaced right Ag periprosthetic fracture with stable femoral and acetabular components and right 1st, thumb metacarpal spiral fracture with minimal displacement  as well as small SDH. PMH includes PAF, s/p IVC filter (2009), DM2, DVT, right THA 10/11/2023   Assessment / Plan / Recommendation Clinical Impression  Pt denies acute concerns with cognitive-lingustic function and scored WFL on all administerd portions of the Cognistat. Education was completed with pt  verbalizing understanding. Ongoing SLP f/u is not warranted at this time, will sign off.    SLP Assessment  SLP Recommendation/Assessment: Patient does not need any further Speech Lanaguage Pathology Services SLP Visit Diagnosis: Cognitive communication deficit (R41.841)    Recommendations for follow up therapy are one component of a multi-disciplinary discharge planning process, led by the attending physician.  Recommendations may be updated based on patient status, additional functional criteria and insurance authorization.    Follow Up Recommendations  No SLP follow up    Assistance Recommended at Discharge  PRN  Functional Status Assessment Patient has not had a recent decline in their functional status  Frequency and Duration           SLP Evaluation Cognition  Overall Cognitive Status: Within Functional Limits for tasks assessed Orientation Level: Oriented X4       Comprehension  Auditory Comprehension Overall Auditory Comprehension: Appears within functional limits for tasks assessed    Expression Expression Primary Mode of Expression: Verbal Verbal Expression Overall Verbal Expression: Appears within functional limits for tasks assessed   Oral / Motor  Oral Motor/Sensory Function Overall Oral Motor/Sensory Function: Within functional limits Motor Speech Overall Motor Speech: Appears within functional limits for tasks assessed            Amil Kale, M.A., CCC-SLP Speech Language Pathology, Acute Rehabilitation Services  Secure Chat preferred [phone]  11/07/2023, 3:05 PM

## 2023-11-08 DIAGNOSIS — S066X0A Traumatic subarachnoid hemorrhage without loss of consciousness, initial encounter: Secondary | ICD-10-CM | POA: Diagnosis not present

## 2023-11-08 LAB — URINE CULTURE: Culture: >=100000 — AB

## 2023-11-08 MED ORDER — METHOCARBAMOL 500 MG PO TABS: 500.0000 mg | ORAL_TABLET | Freq: Three times a day (TID) | ORAL | 0 refills | Status: AC | PRN

## 2023-11-08 MED ORDER — APIXABAN 5 MG PO TABS: 5.0000 mg | ORAL_TABLET | Freq: Two times a day (BID) | ORAL | 5 refills | Status: AC

## 2023-11-08 MED ORDER — TRAMADOL HCL 50 MG PO TABS: 50.0000 mg | ORAL_TABLET | Freq: Four times a day (QID) | ORAL | 0 refills | Status: AC | PRN

## 2023-11-08 MED ORDER — LEVETIRACETAM 500 MG PO TABS: 500.0000 mg | ORAL_TABLET | Freq: Two times a day (BID) | ORAL | 0 refills | Status: AC

## 2023-11-08 MED ORDER — CIPROFLOXACIN HCL 500 MG PO TABS: 500.0000 mg | ORAL_TABLET | Freq: Two times a day (BID) | ORAL | 0 refills | Status: AC

## 2023-11-08 NOTE — Plan of Care (Signed)

## 2023-11-08 NOTE — Progress Notes (Signed)
 Physical Therapy Treatment Patient Details Name: Nicholas Lam MRN: [id number] DOB: 1955-10-25 Today's Date: 11/08/2023   History of Present Illness Pt is a 68 y.o. M who presents 11/06/2023 after a fall out of his w/c. Pt found to have minimally displaced right Ag periprosthetic fracture with stable femoral and acetabular components and right 1st, thumb metacarpal spiral fracture with minimal displacement as well as small SDH. PMH includes PAF, s/p IVC filter (2009), DM2, DVT, right THA 10/11/2023.    PT Comments  Pt progressing well towards his physical therapy goals. Initiated session with warm up exercises on edge of bed. Pt performing stand pivot transfer to chair and then ambulating an additional 5 ft with RW and CGA. HR up to 130 bpm with activity. Reviewed precautions. HHPT remains appropriate d/c recommendation.    If plan is discharge home, recommend the following: A little help with walking and/or transfers;A little help with bathing/dressing/bathroom;Assistance with cooking/housework;Assist for transportation   Can travel by private vehicle        Equipment Recommendations  None recommended by PT    Recommendations for Other Services       Precautions / Restrictions Precautions Precautions: Fall Precaution/Restrictions Comments: Watch HR Restrictions Weight Bearing Restrictions Per Provider Order: No Other Position/Activity Restrictions: No active R hip abduction     Mobility  Bed Mobility Overal bed mobility: Modified Independent             General bed mobility comments: Exiting left side of bed    Transfers Overall transfer level: Needs assistance Equipment used: None Transfers: Bed to chair/wheelchair/BSC   Stand pivot transfers: Contact guard assist         General transfer comment: Recliner set up on R side of bed, pt performs clockwise stand pivot transfers with BUE support on armrests. Increased time, no gross unsteadiness     Ambulation/Gait Ambulation/Gait assistance: Contact guard assist Gait Distance (Feet): 5 Feet Assistive device: Rolling walker (2 wheels) Gait Pattern/deviations: Step-to pattern, Decreased stride length       General Gait Details: Slowed step to pattern with cues for sequencing/technique, decreased bilateral foot clearance, chair follow utilized   Optometrist     Tilt Bed    Modified Rankin (Stroke Patients Only)       Balance Overall balance assessment: Needs assistance Sitting-balance support: Feet supported Sitting balance-Leahy Scale: Good     Standing balance support: Bilateral upper extremity supported Standing balance-Leahy Scale: Poor Standing balance comment: reliant on BUE support                            Communication Communication Communication: Impaired Factors Affecting Communication: Hearing impaired  Cognition Arousal: Alert Behavior During Therapy: WFL for tasks assessed/performed   PT - Cognitive impairments: No apparent impairments                         Following commands: Intact      Cueing Cueing Techniques: Verbal cues  Exercises General Exercises - Lower Extremity Long Arc Quad: AROM, Both, 10 reps, Seated Heel Raises: AROM, Both, 20 reps, Seated, Strengthening    General Comments        Pertinent Vitals/Pain Pain Assessment Pain Assessment: Faces Faces Pain Scale: No hurt    Home Living  Prior Function            PT Goals (current goals can now be found in the care plan section) Acute Rehab PT Goals Patient Stated Goal: go home PT Goal Formulation: With patient Time For Goal Achievement: 11/21/23 Potential to Achieve Goals: Good Progress towards PT goals: Progressing toward goals    Frequency    Min 3X/week      PT Plan      Co-evaluation              AM-PAC PT "6 Clicks" Mobility   Outcome Measure   Help needed turning from your back to your side while in a flat bed without using bedrails?: None Help needed moving from lying on your back to sitting on the side of a flat bed without using bedrails?: None Help needed moving to and from a bed to a chair (including a wheelchair)?: A Little Help needed standing up from a chair using your arms (e.g., wheelchair or bedside chair)?: A Little Help needed to walk in hospital room?: A Little Help needed climbing 3-5 steps with a railing? : Total 6 Click Score: 18    End of Session Equipment Utilized During Treatment: Gait belt Activity Tolerance: Patient tolerated treatment well Patient left: with call bell/phone within reach;with chair alarm set;in chair Nurse Communication: Mobility status PT Visit Diagnosis: Difficulty in walking, not elsewhere classified (R26.2);Unsteadiness on feet (R26.81)     Time: 0454-0981 PT Time Calculation (min) (ACUTE ONLY): 24 min  Charges:    $Therapeutic Activity: 23-37 mins PT General Charges $$ ACUTE PT VISIT: 1 Visit                     Verdia Glad, PT, DPT Acute Rehabilitation Services Office [phone]    Claria Crofts 11/08/2023, 9:43 AM

## 2023-11-08 NOTE — Discharge Summary (Signed)
 Patient ID: Nicholas Lam [id number] 04-28-1956 68 y.o.  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   Porokeratosis 02/05/2020   Onychomycosis of multiple toenails with type 2 diabetes mellitus (HCC) 02/05/2020   Paroxysmal atrial fibrillation (HCC) 08/11/2017   Amblyopia of left eye 04/16/2016   Hypertropia of right eye 04/16/2016   PVD (posterior vitreous detachment), bilateral 04/16/2016   Metatarsalgia 11/23/2015   Adenoma of left adrenal gland 09/04/2015   Hepatic cyst 09/04/2015   Diabetes mellitus with diabetic neuropathy (HCC) 09/04/2015   Ventral hernia without obstruction or gangrene 09/04/2015   Left retinal detachment 08/29/2015   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   Obesity, Class I, BMI 30-34.9 08/29/2015   Sensorineural hearing loss (SNHL) of both ears 08/29/2015   Strabismus, mechanical 08/29/2015   Fall out of wheelchair UTI  R 1st MC fx  SAH Recent hip surgery  H/o frequent falls DM  Consultants Dr. Bernard Brick, ortho Dr. Nat Badger, NSGY  Reason for Admission: 26M fell out of his motorized wheelchair when the wheel got caught on the curb. Denies LOC. Reports a brief moment where he had difficulty speaking, but this resolved. Denies n/v.   Recent history of R hip surgery 4/29 by Dr. Bernard Brick. In a wheelchair for the last year due to severe OA. 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 Noted to also have AF.    History of frequent falls despite wheelchair use.   Procedures none  Hospital Course:  Fall out of wheelchair   R 1st MC fx  Thumb spice splint, ortho will have him follow up with a hand surgeon in their office.  SAH  NSGY c/s, Dr. Nat Badger, repeat head CT in 6-12h, if stable okay to discharge, no reversal of Eliquis , hold Eliquis , . okay to resume Eliquis  in 7d from NSGY standpoint. SLp for cog eval with no follow up recommended.  Recent hip surgery  per Dr. Bernard Brick, non-op and WBAT, follow up as outpatient.  H/o frequent falls  Recommend permanent d/c of Eliquis , especially in light of the IVCF in place. Case d/w VVS regarding AC. Recommendations are for resuming of Eliquis  when cleared by NSGY given his history of extensive DVT and despite the IVCF. If Eliquis  were discontinued, patient would be at extremely high risk of clotting his IVCF and would be a high risk surgical candidate for thrombectomy and potentially not a surgical candidate. With resuming Eliquis  in the setting of history of numerous falls, patient is at extremely high risk of devastating intracranial hemorrhage.   DM  Refuses inpatient or outpatient medications  UTI Got Rocephin  here and discharged on 3 more days of Cipro  for Citrobacter on urine cx.  Physical Exam: General: pleasant, WD, WN male, NAD HEENT: ecchymosis of left temple  Heart: pedal pulses intact BL, sinus tachycardia in the 100s Lungs: CTAB, no  wheezes, rhonchi, or rales noted.  Respiratory effort nonlabored Abd: soft, NT, ND,  MS: splint to R forearm, fingers WWP Skin: warm and dry with no masses, lesions, or rashes Neuro: Cranial nerves 2-12 grossly intact, sensation is normal throughout Psych: A&Ox3 with an appropriate affect.   Allergies as of 11/08/2023       Reactions   Iodinated Contrast Media Hives, Rash, Swelling   Iodine  Hives, Swelling, Rash         Medication List     STOP taking these medications    ASHWAGANDHA PO   fluconazole  100 MG tablet Commonly known as: DIFLUCAN    Ozempic  (0.25 or 0.5 MG/DOSE) 2 MG/3ML Sopn Generic drug: Semaglutide (0.25 or 0.5MG /DOS)   terbinafine  250 MG tablet Commonly known as: LAMISIL        TAKE these medications    acetaminophen  500 MG tablet Commonly known as: TYLENOL  Take 2 tablets (1,000 mg total) by mouth every 6 (six) hours.   apixaban  5 MG Tabs tablet Commonly known as: ELIQUIS  Take 1 tablet (5 mg total) by mouth 2 (two) times daily. Start taking on: November 15, 2023 What changed: These instructions start on November 15, 2023. If you are unsure what to do until then, ask your doctor or other care provider.   AZO URINARY TRACT DEFENSE PO Take 1 Application by mouth daily as needed (uti symptoms/irritation.). Azo Urinary Tract Defense Antibacterial   BROMELAIN PO Take 500 mg by mouth every 3 (three) days.   chlorhexidine  4 % external liquid Commonly known as: HIBICLENS  Apply 15 mLs (1 Application total) topically as directed for 30 doses. Use as directed daily for 5 days every other week for 6 weeks.   ciprofloxacin 500 MG tablet Commonly known as: Cipro Take 1 tablet (500 mg total) by mouth 2 (two) times daily for 3 days.   clotrimazole 1 % cream Commonly known as: LOTRIMIN Apply 1 Application topically 2 (two) times daily as needed (yeast (irritation)).   CRANBERRY-D MANNOSE PO Take 1 capsule by mouth daily as needed (urinary tract issues.).   levETIRAcetam 500 MG tablet Commonly known as: Keppra Take 1 tablet (500 mg total) by mouth 2 (two) times daily for 7 days.   MAG GLYCINATE PO Take 200 mg by mouth 2 (two) times a week.   methocarbamol  500 MG tablet Commonly known as: ROBAXIN  Take 1 tablet (500 mg total) by mouth every 8 (eight) hours as needed for muscle spasms. What changed: when to take this   multivitamin with minerals Tabs tablet Take 1 tablet by mouth  every 14 (fourteen) days.   mupirocin  ointment 2 % Commonly known as: BACTROBAN  Place 1 Application into the nose 2 (two) times daily for 60 doses. Use as directed 2 times daily for 5 days every other week for 6 weeks.   nystatin -triamcinolone  cream Commonly known as: MYCOLOG II Apply topically 2 (two) times daily. What changed:  how much to take when to take this reasons to take this   polyethylene glycol 17 g packet Commonly known as: MIRALAX  / GLYCOLAX  Take 17 g by mouth 2 (two) times daily.   traMADol  50 MG tablet Commonly known as: ULTRAM  Take 1 tablet (50 mg total) by mouth every 6 (six) hours as needed. What changed:  how much to take reasons to take this   VITAMIN D3 PO Take 10,000 Units by mouth once a week.          Follow-up Information     Claiborne Crew, MD Follow up  in 3 week(s).   Specialty: Orthopedic Surgery Why: For clinical and radiographic assessment of right hip and right hand injuries Contact information: [address] [place] [address] [phone]         Care, [facility] Follow up.   Specialty: Home Health Services Why: resume HH as previously ordered Contact information: [address] [place] [address] [phone]         Augusto Blonder, MD Follow up.   Specialty: Neurosurgery Why: As needed for your head injury Contact information: [address] [place] [address] [phone]                 Signed: Marlin Simmonds, [facility] 11/08/2023, 12:42 PM Please see Amion for pager number during day hours 7:00am-4:30pm, 7-11:30am on Weekends

## 2023-11-08 NOTE — TOC Initial Note (Addendum)
 Transition of Care [facility]) - Initial/Assessment Note    Patient Details  Name: Nicholas Lam MRN: [id number] Date of Birth: [birth year]  [facility] CM/SW Contact:    Kenedi Cilia E Adrea Sherpa, LCSW Phone Number: 11/08/2023, 10:56 AM  Clinical Narrative:                 CSW met with patient at bedside. Patient's caregiver Devra Fontana at bedside. Patient states he lives alone, but has caregivers Devra Fontana and [place]) that he arranges to come out as often as needed and they provide transportation. PCP is Dr. Mclendon. [facility]acy is [facility]. Patient has DME at home. Patient states he is active with [facility], notified [facility] with [place] of admission.  12:50- Notified Cory with [facility] that patient has orders to DC home today.  Expected Discharge Plan: Home w Home Health Services Barriers to Discharge: Continued Medical Work up   Patient Goals and CMS Choice   CMS Medicare.gov Compare Post Acute Care list provided to:: Patient Choice offered to / list presented to : Patient      Expected Discharge Plan and Services   Discharge Planning Services: CM Consult Post Acute Care Choice: Home Health, Resumption of Svcs/PTA Provider Living arrangements for the past 2 months: Single Family Home Expected Discharge Date: 11/07/23                           [facility] Agency: [facility] Care        Prior Living Arrangements/Services Living arrangements for the past 2 months: Single Family Home Lives with:: Self Patient language and need for interpreter reviewed:: Yes Do you feel safe going back to the place where you live?: Yes      Need for Family Participation in Patient Care: Yes (Comment) Care giver support system in place?: Yes (comment) Current home services: DME, Home PT Criminal Activity/Legal Involvement Pertinent to Current Situation/Hospitalization: No - Comment as needed  Activities of Daily Living   ADL Screening (condition at time  of admission) Independently performs ADLs?: Yes (appropriate for developmental age) Is the patient deaf or have difficulty hearing?: Yes Does the patient have difficulty seeing, even when wearing glasses/contacts?: No Does the patient have difficulty concentrating, remembering, or making decisions?: No  Permission Sought/Granted Permission sought to share information with : Facility Industrial/product designer granted to share information with : Yes, Verbal Permission Granted     Permission granted to share info w AGENCY: [facility]        Emotional Assessment       Orientation: : Oriented to Self, Oriented to Place, Oriented to  Time, Oriented to Situation Alcohol / Substance Use: Not Applicable Psych Involvement: No (comment)  Admission diagnosis:  SAH (subarachnoid hemorrhage) (HCC) [I60.9] Chronic anticoagulation [Z79.01] Acute cystitis without hematuria [N30.00] Closed nondisplaced fracture of proximal phalanx of right thumb, initial encounter [S62.514A] Subarachnoid hemorrhage following injury, no loss of consciousness, initial encounter (HCC) [S06.6X0A] Patient Active Problem List   Diagnosis Date Noted   SAH (subarachnoid hemorrhage) (HCC) 11/06/2023   Mobility impaired 10/19/2023   Coccyx pain 10/19/2023   Tremor 10/19/2023   Oral phase dysphagia 10/19/2023   S/P total right hip arthroplasty 10/11/2023   Yeast UTI 07/14/2023   Tinea corporis 07/14/2023   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   Porokeratosis 02/05/2020   Onychomycosis of multiple toenails with type 2 diabetes mellitus (HCC) 02/05/2020   Paroxysmal atrial fibrillation (HCC) 08/11/2017  Amblyopia of left eye 04/16/2016   Hypertropia of right eye 04/16/2016   PVD (posterior vitreous detachment), bilateral 04/16/2016   Metatarsalgia 11/23/2015   Adenoma of left adrenal gland 09/04/2015   Hepatic cyst 09/04/2015   Diabetes mellitus with diabetic neuropathy (HCC) 09/04/2015   Ventral hernia without  obstruction or gangrene 09/04/2015   Left retinal detachment 08/29/2015   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   Obesity, Class I, BMI 30-34.9 08/29/2015   Sensorineural hearing loss (SNHL) of both ears 08/29/2015   Strabismus, mechanical 08/29/2015   PCP:  Adria Hopkins, MD Pharmacy:   [id number] (INTERNAL USE ONLY) - [place], [place] - [address] [place] [address] Phone: [phone] Fax: [phone]     Social Drivers of Health (SDOH) Social History: SDOH Screenings   Food Insecurity: Patient Declined (11/07/2023)  Housing: Patient Declined (11/07/2023)  Transportation Needs: Patient Declined (11/07/2023)  Recent Concern: Transportation Needs - Unmet Transportation Needs (08/10/2023)  Utilities: Not At Risk (11/07/2023)  Alcohol Screen: Low Risk  (08/10/2023)  Depression (PHQ2-9): Low Risk  (09/21/2023)  Financial Resource Strain: Low Risk  (08/10/2023)  Physical Activity: Inactive (08/10/2023)  Social Connections: Unknown (11/07/2023)  Recent Concern: Social Connections - Moderately Isolated (10/11/2023)  Stress: No Stress Concern Present (08/10/2023)  Tobacco Use: Low Risk  (10/11/2023)  Health Literacy: Adequate Health Literacy (08/10/2023)   SDOH Interventions:     Readmission Risk Interventions    11/26/2021   10:07 AM  Readmission Risk Prevention Plan  Post Dischage Appt Complete  Medication Screening Complete  Transportation Screening Complete

## 2023-11-09 ENCOUNTER — Telehealth: Payer: Self-pay

## 2023-11-09 NOTE — Transitions of Care (Post Inpatient/ED Visit) (Signed)
 11/09/2023  Name: Nicholas Lam MRN: [id number] DOB: 1955/07/14  Today's TOC FU Call Status: Today's TOC FU Call Status:: Successful TOC FU Call Completed Unsuccessful Call (1st Attempt) Date: 11/09/23 [name] FU Call Complete Date: 11/09/23 Patient's Name and Date of Birth confirmed.  Transition Care Management Follow-up Telephone Call Date of Discharge: 11/08/23 Discharge Facility: [facility]) Type of Discharge: Inpatient Admission Primary Inpatient Discharge Diagnosis:: fracture of phanlanx of right thumb How have you been since you were released from the hospital?: Better Any questions or concerns?: No  Items Reviewed: Did you receive and understand the discharge instructions provided?: Yes Medications obtained,verified, and reconciled?: Yes (Medications Reviewed) Any new allergies since your discharge?: No Dietary orders reviewed?: Yes Do you have support at home?: Yes People in Home [RPT]: spouse  Medications Reviewed Today: Medications Reviewed Today     Reviewed by Darrall Ellison, LPN (Licensed Practical Nurse) on 11/09/23 at 1104  Med List Status: <None>   Medication Order Taking? Sig Documenting Provider Last Dose Status Informant  acetaminophen  (TYLENOL ) 500 MG tablet [id number] No Take 2 tablets (1,000 mg total) by mouth every 6 (six) hours. Earnie Gola, PA-C Unknown Active Self, Pharmacy Records  apixaban  (ELIQUIS ) 5 MG TABS tablet [id number]  Take 1 tablet (5 mg total) by mouth 2 (two) times daily. Marlin Simmonds, PA-C  Active   Bromelains (BROMELAIN PO) [id number] No Take 500 mg by mouth every 3 (three) days. [provider] 11/05/2023 Bedtime Active Self, Pharmacy Records           Med Note Raquel Cables, Alfonzo Angst Sep 26, 2023 10:42 AM) Alternate ibuprofen, acetaminophen , and bromelain on a rotating schedule, taking one of the three every third day  chlorhexidine  (HIBICLENS ) 4 % external liquid [id number] No Apply 15 mLs (1 Application  total) topically as directed for 30 doses. Use as directed daily for 5 days every other week for 6 weeks. Earnie Gola, PA-C Past Week Active Self, Pharmacy Records  Cholecalciferol (VITAMIN D3 PO) [id number] No Take 10,000 Units by mouth once a week. [provider] Past Week Active Self, Pharmacy Records  ciprofloxacin (CIPRO) 500 MG tablet [id number]  Take 1 tablet (500 mg total) by mouth 2 (two) times daily for 3 days. Marlin Simmonds, PA-C  Active   clotrimazole (LOTRIMIN) 1 % cream [id number] No Apply 1 Application topically 2 (two) times daily as needed (yeast (irritation)). [provider] Unknown Active Self, Pharmacy Records  CRANBERRY-D MANNOSE PO [id number] No Take 1 capsule by mouth daily as needed (urinary tract issues.). [provider] Unknown Active Self, Pharmacy Records  levETIRAcetam (KEPPRA) 500 MG tablet [id number]  Take 1 tablet (500 mg total) by mouth 2 (two) times daily for 7 days. Marlin Simmonds, PA-C  Active   Magnesium Bisglycinate (MAG GLYCINATE PO) [id number] No Take 200 mg by mouth 2 (two) times a week. [provider] Unknown Active Self, Pharmacy Records  Methenamine-Sodium Salicylate (AZO URINARY TRACT DEFENSE PO) [id number] No Take 1 Application by mouth daily as needed (uti symptoms/irritation.). Azo Urinary Tract Defense Antibacterial [provider] Past Week Active Self, Pharmacy Records  methocarbamol  (ROBAXIN ) 500 MG tablet [id number]  Take 1 tablet (500 mg total) by mouth every 8 (eight) hours as needed for muscle spasms. Marlin Simmonds, PA-C  Active   Multiple Vitamin (MULTIVITAMIN WITH MINERALS) TABS tablet [id number] No Take 1 tablet by mouth every 14 (fourteen) days. [provider] Past Month Active Self, Pharmacy Records  mupirocin  ointment (BACTROBAN )  2 % [id number] No Place 1 Application into the nose 2 (two) times daily for 60 doses. Use as directed 2 times daily for 5 days every other week for 6 weeks.  Earnie Gola, PA-C Unknown Active Self, Pharmacy Records  nystatin -triamcinolone  [facility] II) cream [id number] No Apply topically 2 (two) times daily.  Patient taking differently: Apply 1 Application topically 2 (two) times daily as needed (skin irritation.).   Adolph Hoop, PA-C Past Week Active Self, Pharmacy Records  polyethylene glycol (MIRALAX  / GLYCOLAX ) 17 g packet [id number] No Take 17 g by mouth 2 (two) times daily.  Patient not taking: Reported on 11/06/2023   Earnie Gola, PA-C Not Taking Active Self, Pharmacy Records  traMADol  (ULTRAM ) 50 MG tablet [id number]  Take 1 tablet (50 mg total) by mouth every 6 (six) hours as needed. Marlin Simmonds, PA-C  Active             Home Care and Equipment/Supplies: Were Home Health Services Ordered?: Yes Name of Home Health Agency:: [facility] Has Agency set up a time to come to your home?: No Any new equipment or medical supplies ordered?: NA  Functional Questionnaire: Do you need assistance with bathing/showering or dressing?: No Do you need assistance with meal preparation?: No Do you need assistance with eating?: No Do you have difficulty maintaining continence: No Do you need assistance with getting out of bed/getting out of a chair/moving?: No Do you have difficulty managing or taking your medications?: No  Follow up appointments reviewed: PCP Follow-up appointment confirmed?: NA Specialist Hospital Follow-up appointment confirmed?: Yes Date of Specialist follow-up appointment?: 11/10/23 Follow-Up Specialty Provider:: ortho Do you need transportation to your follow-up appointment?: No Do you understand care options if your condition(s) worsen?: Yes-patient verbalized understanding    SIGNATURE Darrall Ellison, LPN [facility] Nurse Health Advisor Direct Dial [phone]

## 2023-11-09 NOTE — Transitions of Care (Post Inpatient/ED Visit) (Signed)
   11/09/2023  Name: EVANS LEVEE MRN: [id number] DOB: 1955/06/17  Today's TOC FU Call Status: Today's TOC FU Call Status:: Unsuccessful Call (1st Attempt) Unsuccessful Call (1st Attempt) Date: 11/09/23  Attempted to reach the patient regarding the most recent Inpatient/ED visit.  Follow Up Plan: Additional outreach attempts will be made to reach the patient to complete the Transitions of Care (Post Inpatient/ED visit) call.   Signature Darrall Ellison, LPN [facility] Nurse Health Advisor Direct Dial [phone]

## 2023-11-15 LAB — HM DIABETES EYE EXAM

## 2023-11-15 LAB — SPECIMEN STATUS REPORT

## 2023-11-15 LAB — T4, FREE: Free T4: 1.27 ng/dL (ref 0.82–1.77)

## 2023-11-23 ENCOUNTER — Ambulatory Visit: Payer: Self-pay | Admitting: Student

## 2023-11-29 ENCOUNTER — Encounter: Payer: Self-pay | Admitting: Student

## 2023-11-29 DIAGNOSIS — S62231A Other displaced fracture of base of first metacarpal bone, right hand, initial encounter for closed fracture: Secondary | ICD-10-CM | POA: Insufficient documentation

## 2024-01-18 IMAGING — CT CT VENOGRAM ABD-PELV
2 of 10 series · 12 of 46 positions shown, 14 images · non-contrast
Comparison: None Available.
COMPARISON: None Available.

Addendum:
CLINICAL DATA: Deep venous thrombosis.

EXAM:
CT VENOGRAM ABDOMEN AND PELVIS
TECHNIQUE: Multidetector CT imaging of the abdomen and pelvis was performed
using standard protocol during bolus administration of intravenous
contrast. Multiplanar reconstructed images and MIPS were obtained
and reviewed to evaluate the vascular anatomy.

[Series 5: cor · coronal · 0.97mm/px · 2 of 179 slices shown]
[im 60/179  soft-tissue]
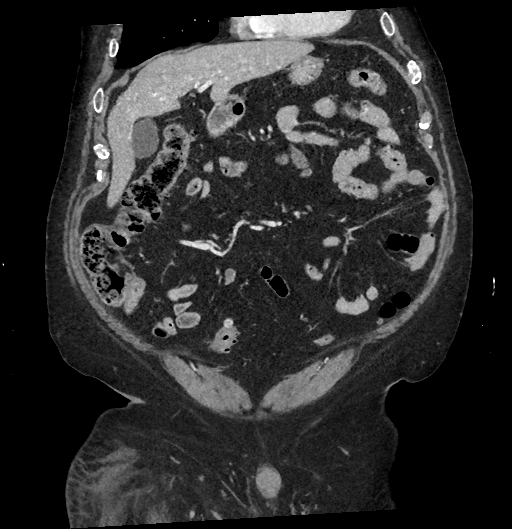
[im 119/179  soft-tissue]
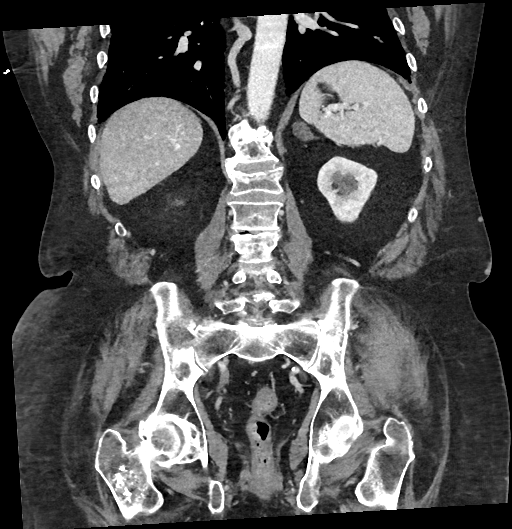

[Series 7: venous thins · axial · portal-venous · 0.98mm/px · z∈[+740,+1142]mm · 10 of 811 slices shown, 12 images]
[im 71/811  soft-tissue]
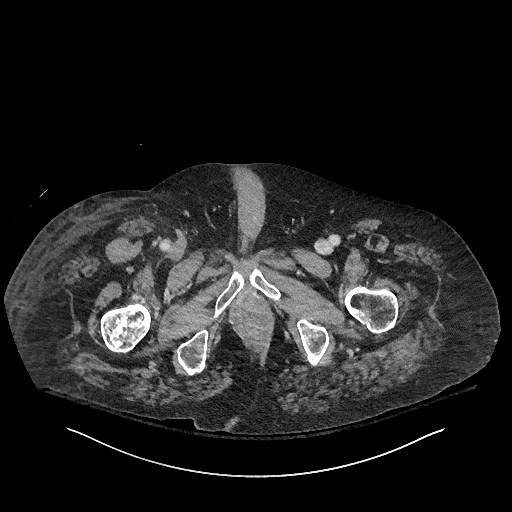
[im 71/811  bone]
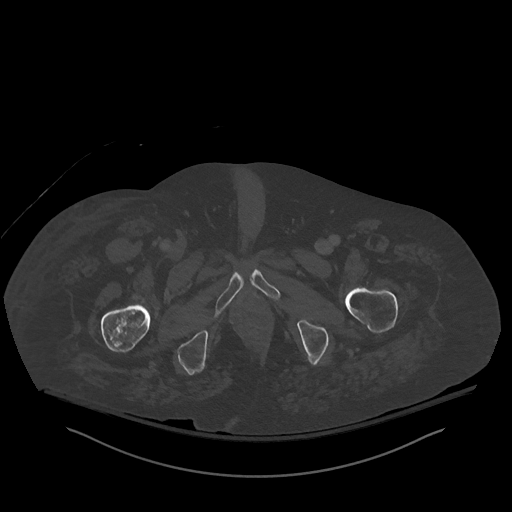
[im 141/811  soft-tissue]
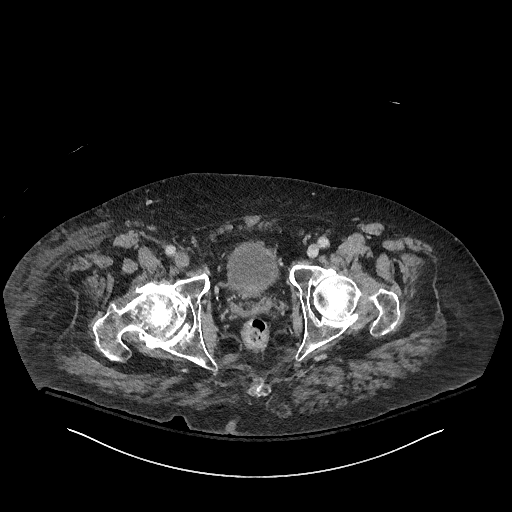
[im 212/811  soft-tissue]
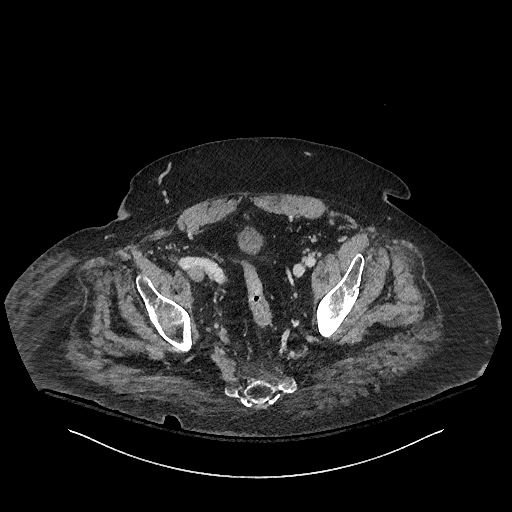
[im 282/811  soft-tissue]
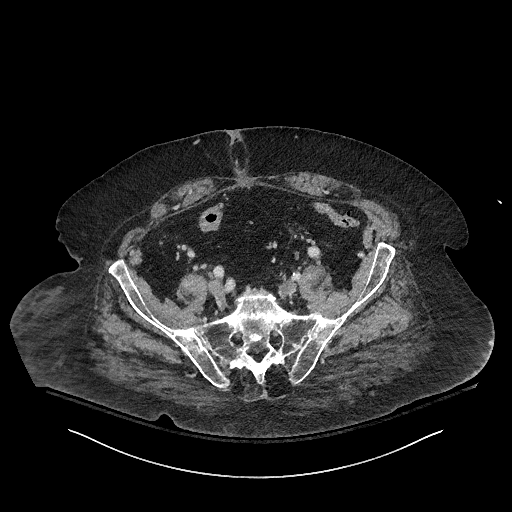
[im 353/811  soft-tissue]
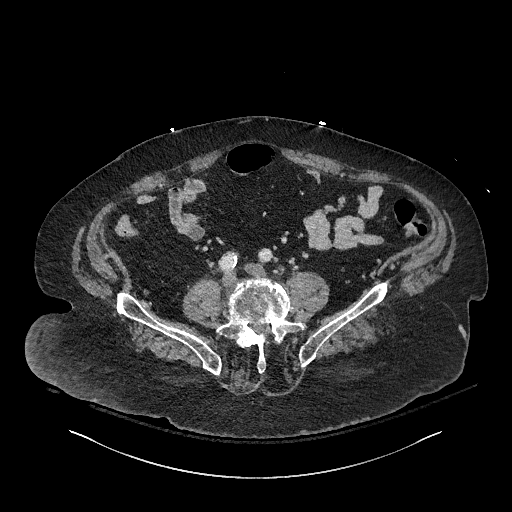
[im 458/811  soft-tissue]
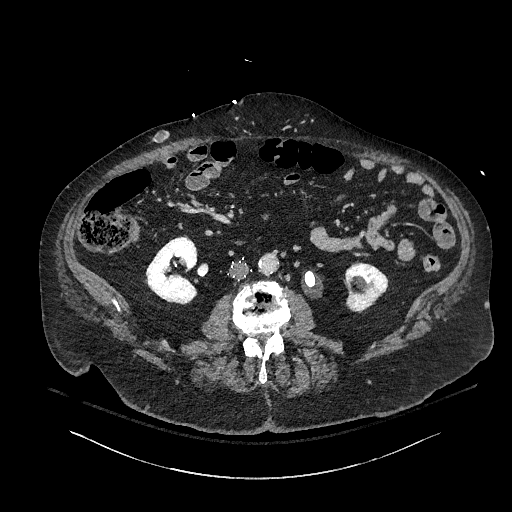
[im 529/811  soft-tissue]
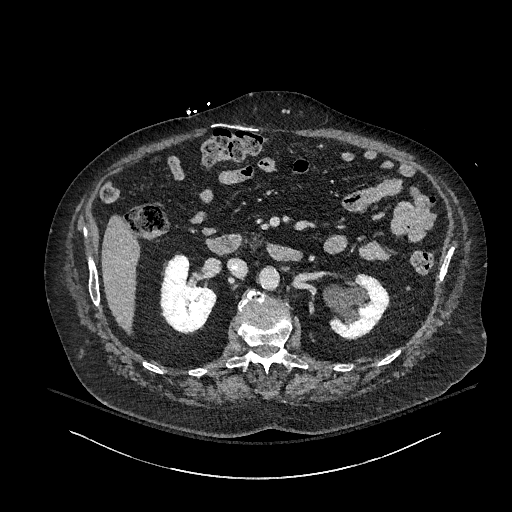
[im 599/811  soft-tissue]
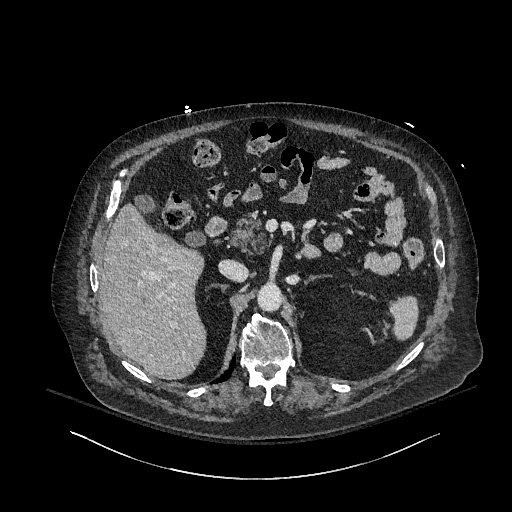
[im 670/811  soft-tissue]
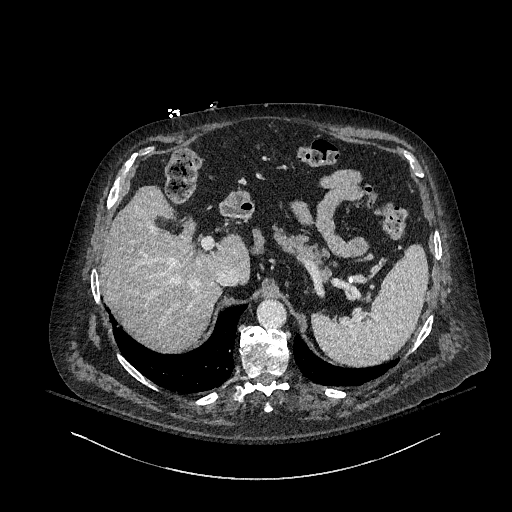
[im 670/811  bone]
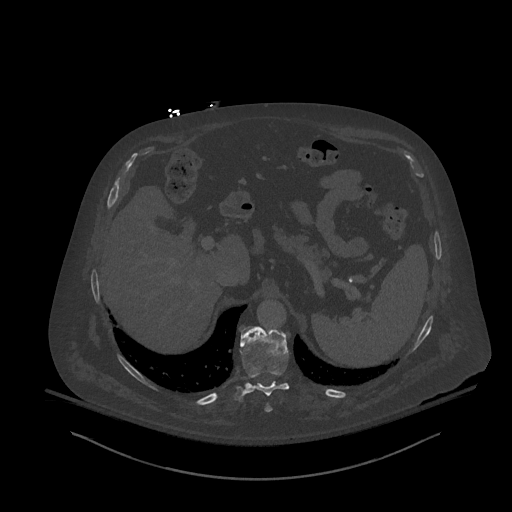
[im 740/811  soft-tissue]
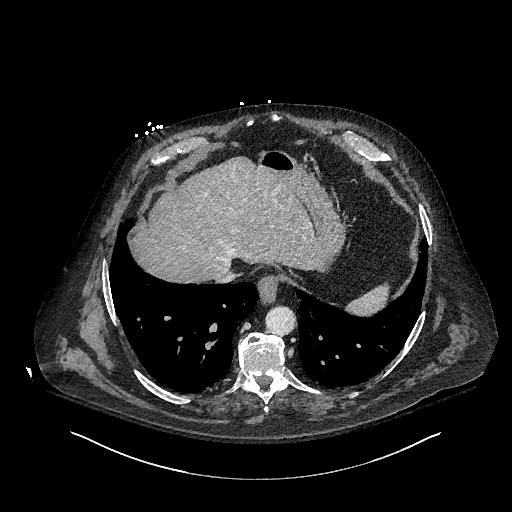

[12 of 46 positions shown; findings below may reference images not displayed]

RADIATION DOSE REDUCTION: This exam was performed according to the
departmental dose-optimization program which includes automated
exposure control, adjustment of the mA and/or kV according to
patient size and/or use of iterative reconstruction technique.

CONTRAST:  125mL OMNIPAQUE IOHEXOL 350 MG/ML SOLN
FINDINGS: There is occlusion of the common femoral, external iliac, and common
iliac veins on the right. Thrombus is present in the proximal
external iliac and common iliac veins on the left. There is likely
occlusive thrombus involving the internal iliac vein on the right
and partial occlusion of the internal iliac vein on the left. There
is thrombus in the IVC to the level of a IVC filter in the
infrarenal region. Distal to the IVC filter. No thrombus is
identified. The portal vein, superior mesenteric vein, and splenic
vein are patent.

Heart is enlarged and there is no pericardial effusion. Atelectasis
and mild fibrotic changes are noted at the lung bases.

Subcentimeter hypodensities are present in the liver which are too
small to further characterize. Hepatic steatosis is noted. The
gallbladder is without stones. No biliary ductal dilatation.

Pancreas is unremarkable. No pancreatic ductal dilatation or
surrounding fat stranding.

The spleen is mildly enlarged at 14 cm in length. No focal
abnormality is seen.

The right adrenal gland is within normal limits. There is a 2.2 cm
nodule in the left adrenal gland with attenuation of -5 Hounsfield
units, compatible with adenoma. The kidneys enhance symmetrically.
Evaluation for renal calculi is limited due to the presence of
contrast at the renal pyramids bilaterally. There is moderate to
severe hydronephrosis on the left with multiple calcifications in
the left renal pelvis, the largest at the ureteropelvic junction
measuring 1.5 cm. No obstructive uropathy on the right. Bladder is
within normal limits.

The stomach is within normal limits. No bowel obstruction, free air,
or pneumatosis. Scattered diverticula are present along the colon
without evidence of diverticulitis. The appendix is not visualized
on exam. No focal bowel wall thickening.

There is atherosclerotic calcification of the aorta without evidence
of aneurysm. No abdominal or pelvic lymphadenopathy.

Prostate gland is unremarkable.

There small fat containing inguinal hernias bilaterally. Evidence of
prior abdominal wall hernia repairs is noted. There is a fat
containing anterior abdominal wall hernia in the midline adjacent to
prior hernia repair. No abdominopelvic ascites. A trace amount of
edema is noted in the presacral space.

Subcutaneous fat stranding is noted at the right hip laterally.
Degenerative changes are present in the thoracolumbar spine. There
are mild compression deformities in the superior endplate at L1 and
superior endplate at L3, indeterminate in age. There are severe
degenerative changes at the right hip and mild degenerative changes
at the left hip. Sclerotic regions are present in the proximal right
femur, likely bone infarct or enchondroma.
IMPRESSION: 1. Extensive deep venous thrombosis involving the IVC, common,
internal, and external iliac veins bilaterally and common femoral
vein on the right. The IVC thrombus extends to the level of a IVC
filter. No thrombus is seen distal to the IVC filter.
2. Moderate to severe obstructive uropathy on the left with a 1.5 cm
calculus at the ureteropelvic junction.
3. Left adrenal adenoma.
4. Subcutaneous fat stranding over the lateral aspect of the right
hip, possible edema, contusion, or infection.
5. Mild compression deformities at L1 and L3, indeterminate in age.
6. Aortic atherosclerosis.
7. Remaining chronic findings as described above.

ADDENDUM:
Critical findings were reported to Dr. Mashobane at [DATE] a.m.

*** End of Addendum ***
RADIATION DOSE REDUCTION: This exam was performed according to the
departmental dose-optimization program which includes automated
exposure control, adjustment of the mA and/or kV according to
patient size and/or use of iterative reconstruction technique.

CONTRAST:  125mL OMNIPAQUE IOHEXOL 350 MG/ML SOLN
FINDINGS: There is occlusion of the common femoral, external iliac, and common
iliac veins on the right. Thrombus is present in the proximal
external iliac and common iliac veins on the left. There is likely
occlusive thrombus involving the internal iliac vein on the right
and partial occlusion of the internal iliac vein on the left. There
is thrombus in the IVC to the level of a IVC filter in the
infrarenal region. Distal to the IVC filter. No thrombus is
identified. The portal vein, superior mesenteric vein, and splenic
vein are patent.

Heart is enlarged and there is no pericardial effusion. Atelectasis
and mild fibrotic changes are noted at the lung bases.

Subcentimeter hypodensities are present in the liver which are too
small to further characterize. Hepatic steatosis is noted. The
gallbladder is without stones. No biliary ductal dilatation.

Pancreas is unremarkable. No pancreatic ductal dilatation or
surrounding fat stranding.

The spleen is mildly enlarged at 14 cm in length. No focal
abnormality is seen.

The right adrenal gland is within normal limits. There is a 2.2 cm
nodule in the left adrenal gland with attenuation of -5 Hounsfield
units, compatible with adenoma. The kidneys enhance symmetrically.
Evaluation for renal calculi is limited due to the presence of
contrast at the renal pyramids bilaterally. There is moderate to
severe hydronephrosis on the left with multiple calcifications in
the left renal pelvis, the largest at the ureteropelvic junction
measuring 1.5 cm. No obstructive uropathy on the right. Bladder is
within normal limits.

The stomach is within normal limits. No bowel obstruction, free air,
or pneumatosis. Scattered diverticula are present along the colon
without evidence of diverticulitis. The appendix is not visualized
on exam. No focal bowel wall thickening.

There is atherosclerotic calcification of the aorta without evidence
of aneurysm. No abdominal or pelvic lymphadenopathy.

Prostate gland is unremarkable.

There small fat containing inguinal hernias bilaterally. Evidence of
prior abdominal wall hernia repairs is noted. There is a fat
containing anterior abdominal wall hernia in the midline adjacent to
prior hernia repair. No abdominopelvic ascites. A trace amount of
edema is noted in the presacral space.

Subcutaneous fat stranding is noted at the right hip laterally.
Degenerative changes are present in the thoracolumbar spine. There
are mild compression deformities in the superior endplate at L1 and
superior endplate at L3, indeterminate in age. There are severe
degenerative changes at the right hip and mild degenerative changes
at the left hip. Sclerotic regions are present in the proximal right
femur, likely bone infarct or enchondroma.
IMPRESSION: 1. Extensive deep venous thrombosis involving the IVC, common,
internal, and external iliac veins bilaterally and common femoral
vein on the right. The IVC thrombus extends to the level of a IVC
filter. No thrombus is seen distal to the IVC filter.
2. Moderate to severe obstructive uropathy on the left with a 1.5 cm
calculus at the ureteropelvic junction.
3. Left adrenal adenoma.
4. Subcutaneous fat stranding over the lateral aspect of the right
hip, possible edema, contusion, or infection.
5. Mild compression deformities at L1 and L3, indeterminate in age.
6. Aortic atherosclerosis.
7. Remaining chronic findings as described above.

## 2024-07-06 ENCOUNTER — Emergency Department (HOSPITAL_COMMUNITY)
Admission: EM | Admit: 2024-07-06 | Discharge: 2024-07-06 | Disposition: A | Discharge status: Home / Self Care | Attending: Emergency Medicine | Admitting: Emergency Medicine

## 2024-07-06 ENCOUNTER — Emergency Department (HOSPITAL_COMMUNITY)

## 2024-07-06 ENCOUNTER — Other Ambulatory Visit: Payer: Self-pay

## 2024-07-06 DIAGNOSIS — I480 Paroxysmal atrial fibrillation: Secondary | ICD-10-CM | POA: Insufficient documentation

## 2024-07-06 DIAGNOSIS — D72829 Elevated white blood cell count, unspecified: Secondary | ICD-10-CM | POA: Insufficient documentation

## 2024-07-06 DIAGNOSIS — N289 Disorder of kidney and ureter, unspecified: Secondary | ICD-10-CM

## 2024-07-06 DIAGNOSIS — S90414A Abrasion, right lesser toe(s), initial encounter: Secondary | ICD-10-CM | POA: Insufficient documentation

## 2024-07-06 DIAGNOSIS — D751 Secondary polycythemia: Secondary | ICD-10-CM | POA: Insufficient documentation

## 2024-07-06 DIAGNOSIS — N189 Chronic kidney disease, unspecified: Secondary | ICD-10-CM | POA: Insufficient documentation

## 2024-07-06 DIAGNOSIS — E1122 Type 2 diabetes mellitus with diabetic chronic kidney disease: Secondary | ICD-10-CM | POA: Insufficient documentation

## 2024-07-06 DIAGNOSIS — Z7901 Long term (current) use of anticoagulants: Secondary | ICD-10-CM | POA: Insufficient documentation

## 2024-07-06 DIAGNOSIS — W1830XA Fall on same level, unspecified, initial encounter: Secondary | ICD-10-CM | POA: Insufficient documentation

## 2024-07-06 DIAGNOSIS — E871 Hypo-osmolality and hyponatremia: Secondary | ICD-10-CM | POA: Insufficient documentation

## 2024-07-06 DIAGNOSIS — W19XXXA Unspecified fall, initial encounter: Secondary | ICD-10-CM

## 2024-07-06 DIAGNOSIS — E1165 Type 2 diabetes mellitus with hyperglycemia: Secondary | ICD-10-CM | POA: Insufficient documentation

## 2024-07-06 LAB — CBC WITH DIFFERENTIAL/PLATELET
Abs Immature Granulocytes: 0.15 K/uL — ABNORMAL HIGH (ref 0.00–0.07)
Basophils Absolute: 0 K/uL (ref 0.0–0.1)
Basophils Relative: 0 %
Eosinophils Absolute: 0.1 K/uL (ref 0.0–0.5)
Eosinophils Relative: 1 %
HCT: 48.9 % (ref 39.0–52.0)
Hemoglobin: 17.2 g/dL — ABNORMAL HIGH (ref 13.0–17.0)
Immature Granulocytes: 1 %
Lymphocytes Relative: 3 %
Lymphs Abs: 0.6 K/uL — ABNORMAL LOW (ref 0.7–4.0)
MCH: 31 pg (ref 26.0–34.0)
MCHC: 35.2 g/dL (ref 30.0–36.0)
MCV: 88.1 fL (ref 80.0–100.0)
Monocytes Absolute: 1.2 K/uL — ABNORMAL HIGH (ref 0.1–1.0)
Monocytes Relative: 7 %
Neutro Abs: 16 K/uL — ABNORMAL HIGH (ref 1.7–7.7)
Neutrophils Relative %: 88 %
Platelets: 147 K/uL — ABNORMAL LOW (ref 150–400)
RBC: 5.55 MIL/uL (ref 4.22–5.81)
RDW: 13.8 % (ref 11.5–15.5)
WBC: 18.2 K/uL — ABNORMAL HIGH (ref 4.0–10.5)
nRBC: 0 % (ref 0.0–0.2)

## 2024-07-06 LAB — URINALYSIS, W/ REFLEX TO CULTURE (INFECTION SUSPECTED)
Bacteria, UA: NONE SEEN
Bilirubin Urine: NEGATIVE
Glucose, UA: >=500 mg/dL — AB
Ketones, ur: 20 mg/dL — AB
Leukocytes,Ua: NEGATIVE
Nitrite: NEGATIVE
Protein, ur: NEGATIVE mg/dL
Specific Gravity, Urine: 1.032 — ABNORMAL HIGH (ref 1.005–1.030)
pH: 5 (ref 5.0–8.0)

## 2024-07-06 LAB — RESP PANEL BY RT-PCR (RSV, FLU A&B, COVID)  RVPGX2
Influenza A by PCR: NEGATIVE
Influenza B by PCR: NEGATIVE
Resp Syncytial Virus by PCR: NEGATIVE
SARS Coronavirus 2 by RT PCR: NEGATIVE

## 2024-07-06 LAB — COMPREHENSIVE METABOLIC PANEL WITH GFR
ALT: 36 U/L (ref 0–44)
AST: 41 U/L (ref 15–41)
Albumin: 3.5 g/dL (ref 3.5–5.0)
Alkaline Phosphatase: 97 U/L (ref 38–126)
Anion gap: 15 (ref 5–15)
BUN: 21 mg/dL (ref 8–23)
CO2: 19 mmol/L — ABNORMAL LOW (ref 22–32)
Calcium: 9 mg/dL (ref 8.9–10.3)
Chloride: 99 mmol/L (ref 98–111)
Creatinine, Ser: 1.6 mg/dL — ABNORMAL HIGH (ref 0.61–1.24)
GFR, Estimated: 46 mL/min — ABNORMAL LOW
Glucose, Bld: 332 mg/dL — ABNORMAL HIGH (ref 70–99)
Potassium: 4.9 mmol/L (ref 3.5–5.1)
Sodium: 132 mmol/L — ABNORMAL LOW (ref 135–145)
Total Bilirubin: 0.8 mg/dL (ref 0.0–1.2)
Total Protein: 6.6 g/dL (ref 6.5–8.1)

## 2024-07-06 MED ORDER — SODIUM CHLORIDE 0.9 % IV BOLUS
1000.0000 mL | Freq: Once | INTRAVENOUS | Status: AC
  Administered 2024-07-06: 1000 mL via INTRAVENOUS

## 2024-07-06 NOTE — ED Notes (Signed)
 Pt refused to tell RN what Pharmacy he prefers. Per pt he would like to discuss what meds are being prescribed before they are sent off.

## 2024-07-06 NOTE — ED Triage Notes (Signed)
 Pt brought in by EMS with a chief complaint of a fall. Per EMS pt was getting out of bed and into wheelchair when he fell on his knees. Per EMS when he was found bilateral lower extremities were cyanotic but regained pallor and good pedal pulses. Pt denies hitting head and neg LOC.

## 2024-07-06 NOTE — ED Provider Notes (Signed)
 "  EMERGENCY DEPARTMENT AT [facility] Provider Note   CSN: [id number] Arrival date & time: 07/06/24  0022     Patient presents with: Nicholas Lam Nicholas Lam is a 69 y.o. male.   The history is provided by the patient.  Fall   He has history of diabetes, DVT status postplacement of an IVC filter, paroxysmal atrial fibrillation anticoagulated on apixaban , chronic kidney disease and comes in following a fall.  He states he is wheelchair dependent and when he tried to transfer to his wheelchair, he fell down with his legs caught under him.  He thinks he injured his feet.  He was only on the floor for about 20 minutes before EMS came to get him.  He denies head, neck, back injury.  He also endorses chills and mild cough which started tonight.  He denies fever or sweats.    Prior to Admission medications  Medication Sig Start Date End Date Taking? Authorizing Provider  acetaminophen  (TYLENOL ) 500 MG tablet Take 2 tablets (1,000 mg total) by mouth every 6 (six) hours. 10/12/23   Patti Rosina SAUNDERS, PA-C  apixaban  (ELIQUIS ) 5 MG TABS tablet Take 1 tablet (5 mg total) by mouth 2 (two) times daily. 11/15/23   Tammy Sor, PA-C  Bromelains (BROMELAIN PO) Take 500 mg by mouth every 3 (three) days.    [provider]  chlorhexidine  (HIBICLENS ) 4 % external liquid Apply 15 mLs (1 Application total) topically as directed for 30 doses. Use as directed daily for 5 days every other week for 6 weeks. 10/11/23   Patti Rosina SAUNDERS, PA-C  Cholecalciferol (VITAMIN D3 PO) Take 10,000 Units by mouth once a week.    [provider]  clotrimazole (LOTRIMIN) 1 % cream Apply 1 Application topically 2 (two) times daily as needed (yeast (irritation)).    [provider]  CRANBERRY-D MANNOSE PO Take 1 capsule by mouth daily as needed (urinary tract issues.).    [provider]  levETIRAcetam  (KEPPRA ) 500 MG tablet Take 1 tablet (500 mg total) by mouth 2 (two) times  daily for 7 days. 11/08/23 11/15/23  Tammy Sor, PA-C  Magnesium Bisglycinate (MAG GLYCINATE PO) Take 200 mg by mouth 2 (two) times a week.    [provider]  Methenamine-Sodium Salicylate (AZO URINARY TRACT DEFENSE PO) Take 1 Application by mouth daily as needed (uti symptoms/irritation.). Azo Urinary Tract Defense Antibacterial    [provider]  methocarbamol  (ROBAXIN ) 500 MG tablet Take 1 tablet (500 mg total) by mouth every 8 (eight) hours as needed for muscle spasms. 11/08/23   Tammy Sor, PA-C  Multiple Vitamin (MULTIVITAMIN WITH MINERALS) TABS tablet Take 1 tablet by mouth every 14 (fourteen) days.    [provider]  nystatin -triamcinolone  (MYCOLOG II) cream Apply topically 2 (two) times daily. Patient taking differently: Apply 1 Application topically 2 (two) times daily as needed (skin irritation.). 07/01/23   Christopher Savannah, PA-C  polyethylene glycol (MIRALAX  / GLYCOLAX ) 17 g packet Take 17 g by mouth 2 (two) times daily. Patient not taking: Reported on 11/06/2023 10/12/23   Patti Rosina SAUNDERS, PA-C  traMADol  (ULTRAM ) 50 MG tablet Take 1 tablet (50 mg total) by mouth every 6 (six) hours as needed. 11/08/23   Tammy Sor, PA-C    Allergies: Iodinated contrast media and Iodine     Review of Systems  All other systems reviewed and are negative.   Updated Vital Signs BP 111/73 (BP Location: Right Arm)   Pulse 92  Temp 98.3 F (36.8 C) (Oral)   Resp 18   Ht 5' 10 (1.778 m)   Wt 104.3 kg   SpO2 97%   BMI 33.00 kg/m   Physical Exam Vitals and nursing note reviewed.   69 year old male, resting comfortably and in no acute distress. Vital signs are normal. Oxygen saturation is 97%, which is normal. Head is normocephalic and atraumatic.  Left eye is deviated nasally, full EOM of the right eye.  Right pupil is round and reactive to light. Neck is nontender. Back is nontender. Lungs are clear without rales, wheezes, or rhonchi. Chest is  nontender. Heart has regular rate and rhythm without murmur. Abdomen is soft, flat, nontender. Extremities: Abrasions are present over the dorsum of the right toes but no deformity or swelling.  Full passive range of motion present all joints. Skin is warm and dry without rash. Neurologic: Awake and alert.    (all labs ordered are listed, but only abnormal results are displayed) Labs Reviewed  COMPREHENSIVE METABOLIC PANEL WITH GFR - Abnormal; Notable for the following components:      Result Value   Sodium 132 (*)    CO2 19 (*)    Glucose, Bld 332 (*)    Creatinine, Ser 1.60 (*)    GFR, Estimated 46 (*)    All other components within normal limits  CBC WITH DIFFERENTIAL/PLATELET - Abnormal; Notable for the following components:   WBC 18.2 (*)    Hemoglobin 17.2 (*)    Platelets 147 (*)    Neutro Abs 16.0 (*)    Lymphs Abs 0.6 (*)    Monocytes Absolute 1.2 (*)    Abs Immature Granulocytes 0.15 (*)    All other components within normal limits  RESP PANEL BY RT-PCR (RSV, FLU A&B, COVID)  [id number]  URINALYSIS, W/ REFLEX TO CULTURE (INFECTION SUSPECTED)    EKG: None  Radiology: DG Chest 1 View Result Date: 07/06/2024 EXAM: 1 VIEW XRAY OF THE CHEST 07/06/2024 01:32:00 AM COMPARISON: CT chest dated 11/06/2023. CLINICAL HISTORY: Fall. FINDINGS: LUNGS AND PLEURA: Low lung volumes. Possible left perihilar opacity, poorly visualized. Suspected small left pleural effusion. No pneumothorax. HEART AND MEDIASTINUM: No acute abnormality of the cardiac and mediastinal silhouettes. BONES AND SOFT TISSUES: No acute osseous abnormality. IMPRESSION: 1. Possible left perihilar opacity, poorly evaluated. Asymmetric interstitial edema is possible, but infection/pneumonia is not excluded. 2. Suspected small left pleural effusion. Electronically signed by: Pinkie Pebbles MD 07/06/2024 01:49 AM EST RP Workstation: [id number]   CT Head Wo Contrast Result Date: 07/06/2024 EXAM: CT HEAD AND CERVICAL SPINE  07/06/2024 01:41:30 AM TECHNIQUE: CT of the head and cervical spine was performed without the administration of intravenous contrast. Multiplanar reformatted images are provided for review. Automated exposure control, iterative reconstruction, and/or weight based adjustment of the mA/kV was utilized to reduce the radiation dose to as low as reasonably achievable. COMPARISON: None available. CLINICAL HISTORY: Head trauma, minor (Age >= 65y) FINDINGS: CT HEAD BRAIN AND VENTRICLES: No acute intracranial hemorrhage. No mass effect or midline shift. No abnormal extra-axial fluid collection. No evidence of acute infarct. No hydrocephalus. ORBITS: No acute abnormality. SINUSES AND MASTOIDS: No acute abnormality. SOFT TISSUES AND SKULL: No acute skull fracture. No acute soft tissue abnormality. CT CERVICAL SPINE BONES AND ALIGNMENT: No acute fracture or traumatic malalignment. DEGENERATIVE CHANGES: Multilevel disc and facet degeneration with greatest foraminal narrowing on the left at C2-3 and C6-7. SOFT TISSUES: No prevertebral soft tissue swelling. IMPRESSION: 1. No acute intracranial abnormality.  2. No acute fracture or traumatic malalignment of the cervical spine. Electronically signed by: Glendia Molt MD 07/06/2024 01:48 AM EST RP Workstation: [id number]   CT Cervical Spine Wo Contrast Result Date: 07/06/2024 EXAM: CT HEAD AND CERVICAL SPINE 07/06/2024 01:41:30 AM TECHNIQUE: CT of the head and cervical spine was performed without the administration of intravenous contrast. Multiplanar reformatted images are provided for review. Automated exposure control, iterative reconstruction, and/or weight based adjustment of the mA/kV was utilized to reduce the radiation dose to as low as reasonably achievable. COMPARISON: None available. CLINICAL HISTORY: Head trauma, minor (Age >= 65y) FINDINGS: CT HEAD BRAIN AND VENTRICLES: No acute intracranial hemorrhage. No mass effect or midline shift. No abnormal extra-axial fluid  collection. No evidence of acute infarct. No hydrocephalus. ORBITS: No acute abnormality. SINUSES AND MASTOIDS: No acute abnormality. SOFT TISSUES AND SKULL: No acute skull fracture. No acute soft tissue abnormality. CT CERVICAL SPINE BONES AND ALIGNMENT: No acute fracture or traumatic malalignment. DEGENERATIVE CHANGES: Multilevel disc and facet degeneration with greatest foraminal narrowing on the left at C2-3 and C6-7. SOFT TISSUES: No prevertebral soft tissue swelling. IMPRESSION: 1. No acute intracranial abnormality. 2. No acute fracture or traumatic malalignment of the cervical spine. Electronically signed by: Glendia Molt MD 07/06/2024 01:48 AM EST RP Workstation: [id number]   DG Foot 2 Views Left Result Date: 07/06/2024 EXAM: 2 VIEW(S) XRAY OF THE LEFT FOOT 07/06/2024 01:32:00 AM COMPARISON: None available. CLINICAL HISTORY: Fall. FINDINGS: BONES AND JOINTS: No acute fracture. Mild hammertoe deformity. Small plantar calcaneal spur. Mild hallux valgus deformity. Mild midfoot degenerative change. Mild first MTP degenerative change. SOFT TISSUES: Unremarkable. IMPRESSION: 1. No acute fracture or dislocation. Electronically signed by: Pinkie Pebbles MD 07/06/2024 01:46 AM EST RP Workstation: [id number]   DG Foot 2 Views Right Result Date: 07/06/2024 EXAM: 2 VIEW(S) XRAY OF THE FOOT 07/06/2024 01:32:00 AM COMPARISON: None available. CLINICAL HISTORY: Fall. FINDINGS: BONES AND JOINTS: No acute fracture. Mild hallux valgus deformity. Calcaneal spur. Degenerative changes in the midfoot. Dorsiflexion at the metatarsophalangeal joints. SOFT TISSUES: Unremarkable. IMPRESSION: 1. No acute fracture or dislocation. Electronically signed by: Pinkie Pebbles MD 07/06/2024 01:46 AM EST RP Workstation: [id number]   DG Hips Bilat W or Wo Pelvis 5 Views Result Date: 07/06/2024 EXAM: 5 VIEW(S) XRAY OF THE BILATERAL HIP 07/06/2024 01:32:00 AM COMPARISON: 11/06/2023 CLINICAL HISTORY: Patient fell. 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 Chronic fracture deformity of the greater trochanter of the right femur. SOFT TISSUES: Unremarkable. IMPRESSION: 1. No acute fracture or dislocation. 2. 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 Associated chronic fracture deformity. Electronically signed by: Pinkie Pebbles MD 07/06/2024 01:45 AM EST RP Workstation: [id number]     Procedures   Medications Ordered in the ED - No data to display                                  Medical Decision Making Amount and/or Complexity of Data Reviewed Labs: ordered. Radiology: ordered.   Follow-up with injury to toes which appear to be just abrasions.  I have ordered x-rays and screening labs.  I reviewed his laboratory tests, my interpretation is mild hyponatremia likely secondary to elevated glucose, creatinine increased over baseline, significant leukocytosis with left shift, polycythemia which may be secondary to some degree of dehydration, negative PCR for COVID-19 and influenza and RSV, no UTI.  X-rays and CT scans are negative for any acute injury.  I have  independently viewed all of the images, and agree with the radiologist's interpretation.  I am ordering some IV fluids, but he is safe for discharge.     Final diagnoses:  Fall at home, initial encounter  Toe abrasion, right, initial encounter  Hyponatremia  Renal insufficiency  Polycythemia  Anticoagulated on apixaban     ED Discharge Orders     None          Raford Lenis, MD 07/06/24 [phone]  "

## 2024-07-06 NOTE — ED Notes (Signed)
 [facility] called

## 2024-07-06 NOTE — Discharge Instructions (Addendum)
 Make sure you are drinking enough fluids.  You may take acetaminophen  as needed for pain.  Please apply ice to any area that is sore.  Ice can be applied for 30 minutes at a time, 4 times a day.

## 2024-07-09 LAB — URINE CULTURE: Culture: 70000 — AB

## 2024-07-10 ENCOUNTER — Telehealth (HOSPITAL_BASED_OUTPATIENT_CLINIC_OR_DEPARTMENT_OTHER): Payer: Self-pay | Admitting: *Deleted

## 2024-07-10 NOTE — Telephone Encounter (Signed)
 Post ED Visit - Positive Culture Follow-up  Culture report reviewed by antimicrobial stewardship pharmacist: [facility] [x]  Leonor Bash, [place].D. []  Venetia Gully, Pharm.D., BCPS AQ-ID []  Garrel Crews, Pharm.D., BCPS []  Almarie Lunger, Pharm.D., BCPS []  [place], [address].D., BCPS, AAHIVP []  Rosaline Bihari, Pharm.D., BCPS, AAHIVP []  Vernell Meier, PharmD, BCPS []  Latanya Hint, PharmD, BCPS []  Donald Medley, PharmD, BCPS []  Rocky Bold, PharmD []  Dorothyann Alert, PharmD, BCPS []  Morene Babe, PharmD  [facility] []  Rosaline Edison, PharmD []  Romona Bliss, PharmD []  Dolphus Roller, PharmD []  Veva Seip, Rph []  Vernell Daunt) Leonce, PharmD []  Eva Allis, PharmD []  Rosaline Millet, PharmD []  Iantha Batch, PharmD []  Arvin Gauss, PharmD []  Wanda Hasting, PharmD []  Ronal Rav, PharmD []  Rocky Slade, PharmD []  Bard Jeans, PharmD   Positive urine culture <100K; no urinary sx.  No further patient follow-up is required at this time.  Lorita Barnie Pereyra 07/10/2024, 1:13 PM
